# Patient Record
Sex: Male | Born: 1945
Health system: Southern US, Community
[De-identification: ages and names within clinical notes are randomized; demographics above are authoritative.]

## PROBLEM LIST (undated history)

## (undated) DIAGNOSIS — I4891 Unspecified atrial fibrillation: Secondary | ICD-10-CM

## (undated) DIAGNOSIS — I428 Other cardiomyopathies: Secondary | ICD-10-CM

## (undated) DIAGNOSIS — Z95810 Presence of automatic (implantable) cardiac defibrillator: Secondary | ICD-10-CM

## (undated) DIAGNOSIS — M1990 Unspecified osteoarthritis, unspecified site: Secondary | ICD-10-CM

## (undated) DIAGNOSIS — I5042 Chronic combined systolic (congestive) and diastolic (congestive) heart failure: Secondary | ICD-10-CM

## (undated) DIAGNOSIS — E785 Hyperlipidemia, unspecified: Secondary | ICD-10-CM

## (undated) DIAGNOSIS — I10 Essential (primary) hypertension: Secondary | ICD-10-CM

## (undated) HISTORY — DX: Unspecified atrial fibrillation: I48.91

## (undated) HISTORY — DX: Essential (primary) hypertension: I10

## (undated) HISTORY — DX: Chronic combined systolic (congestive) and diastolic (congestive) heart failure: I50.42

## (undated) HISTORY — DX: Hyperlipidemia, unspecified: E78.5

## (undated) HISTORY — PX: TONSILLECTOMY: SUR1361

## (undated) HISTORY — DX: Other cardiomyopathies: I42.8

---

## 2010-07-08 DIAGNOSIS — L219 Seborrheic dermatitis, unspecified: Secondary | ICD-10-CM | POA: Insufficient documentation

## 2012-03-22 DIAGNOSIS — M722 Plantar fascial fibromatosis: Secondary | ICD-10-CM | POA: Insufficient documentation

## 2012-04-28 DIAGNOSIS — I10 Essential (primary) hypertension: Secondary | ICD-10-CM | POA: Insufficient documentation

## 2014-06-14 LAB — HM COLONOSCOPY

## 2014-11-26 ENCOUNTER — Telehealth: Payer: Self-pay | Admitting: *Deleted

## 2014-11-26 ENCOUNTER — Encounter: Payer: Self-pay | Admitting: *Deleted

## 2014-11-26 NOTE — Telephone Encounter (Signed)
Pre-Visit Call completed with patient and chart updated.   Pre-Visit Info documented in Specialty Comments under SnapShot.    

## 2014-11-28 ENCOUNTER — Ambulatory Visit (HOSPITAL_BASED_OUTPATIENT_CLINIC_OR_DEPARTMENT_OTHER)
Admission: RE | Admit: 2014-11-28 | Discharge: 2014-11-28 | Disposition: A | Discharge status: Home / Self Care | Payer: BLUE CROSS/BLUE SHIELD | Source: Ambulatory Visit | Attending: Family Medicine | Admitting: Family Medicine

## 2014-11-28 ENCOUNTER — Encounter: Payer: Self-pay | Admitting: Family Medicine

## 2014-11-28 ENCOUNTER — Ambulatory Visit (INDEPENDENT_AMBULATORY_CARE_PROVIDER_SITE_OTHER): Payer: BLUE CROSS/BLUE SHIELD | Admitting: Family Medicine

## 2014-11-28 VITALS — BP 118/62 | HR 66 | Temp 98.0°F | Ht 71.0 in | Wt 283.0 lb

## 2014-11-28 DIAGNOSIS — I499 Cardiac arrhythmia, unspecified: Secondary | ICD-10-CM | POA: Diagnosis not present

## 2014-11-28 DIAGNOSIS — J189 Pneumonia, unspecified organism: Secondary | ICD-10-CM

## 2014-11-28 DIAGNOSIS — R0602 Shortness of breath: Secondary | ICD-10-CM | POA: Diagnosis not present

## 2014-11-28 DIAGNOSIS — R05 Cough: Secondary | ICD-10-CM | POA: Diagnosis not present

## 2014-11-28 DIAGNOSIS — R059 Cough, unspecified: Secondary | ICD-10-CM

## 2014-11-28 DIAGNOSIS — I10 Essential (primary) hypertension: Secondary | ICD-10-CM

## 2014-11-28 MED ORDER — FUROSEMIDE 20 MG PO TABS: 40.0000 mg | ORAL_TABLET | Freq: Every day | ORAL | Status: DC

## 2014-11-28 MED ORDER — CARVEDILOL 6.25 MG PO TABS
6.2500 mg | ORAL_TABLET | Freq: Two times a day (BID) | ORAL | Status: DC
Start: 2014-11-28 — End: 2014-12-09

## 2014-11-28 MED ORDER — PROMETHAZINE-DM 6.25-15 MG/5ML PO SYRP: 5.0000 mL | ORAL_SOLUTION | Freq: Four times a day (QID) | ORAL | Status: DC | PRN

## 2014-11-28 MED ORDER — AMLODIPINE BESYLATE 5 MG PO TABS: 5.0000 mg | ORAL_TABLET | Freq: Every day | ORAL | Status: DC

## 2014-11-28 MED ORDER — CLARITHROMYCIN ER 500 MG PO TB24: 1000.0000 mg | ORAL_TABLET | Freq: Every day | ORAL | Status: AC

## 2014-11-28 NOTE — Patient Instructions (Signed)

## 2014-11-28 NOTE — Progress Notes (Signed)
Pre visit review using our clinic review tool, if applicable. No additional management support is needed unless otherwise documented below in the visit note. 

## 2014-11-29 ENCOUNTER — Telehealth: Payer: Self-pay | Admitting: Family Medicine

## 2014-11-29 NOTE — Telephone Encounter (Signed)
Caller Name: [name]  Relation to pt: daughter  Call back number:[phone]   Reason for call:  [name] (daughter) calling on behalf of her father. Stating pt was seen by DR. Lown 11/28/14 medication was prescirbed and pt went for a walk today and his stomach started cramping could that be from the medication prescribed?. Pt in need of clinical advice.

## 2014-11-29 NOTE — Telephone Encounter (Signed)
Spoke with patient who stated he was having some bloating that was relieved by going to the restroom.  He was walking and felt uncomfortable from the bloating, which is an ongoing issue he has had before the antibiotic.  He states that he has a procedure scheduled next week (echo) and an appointment with Dr. Etter Sjogren in two weeks.  He states he will call if he has any more abdominal symptoms.

## 2014-11-29 NOTE — Telephone Encounter (Signed)
Please Triage this patient.     KP

## 2014-12-02 NOTE — Progress Notes (Signed)
Patient ID: Alexander Higgins, male    DOB: 1945-11-23  Age: 69 y.o. MRN: [id number]    Subjective:  Subjective HPI Alexander Higgins presents to establish and c/o cough that is productive and edema in low ext.   Review of Systems  Constitutional: Negative for fever and chills.  HENT: Negative for congestion, postnasal drip, rhinorrhea and sinus pressure.   Respiratory: Positive for cough, chest tightness, shortness of breath and wheezing.   Cardiovascular: Negative for chest pain, palpitations and leg swelling.  Allergic/Immunologic: Negative for environmental allergies.  Psychiatric/Behavioral: Negative for decreased concentration. The patient is not nervous/anxious.     History Past Medical History  Diagnosis Date  . Hypertension     He has no past surgical history on file.   His family history includes Heart attack in his brother; Heart disease in his mother; Hypertension in his brother and mother; Stroke in his sister.He reports that he has never smoked. He does not have any smokeless tobacco history on file. He reports that he does not drink alcohol. His drug history is not on file.  No current outpatient prescriptions on file prior to visit.   No current facility-administered medications on file prior to visit.     Objective:  Objective Physical Exam  Constitutional: He is oriented to person, place, and time. He appears well-developed and well-nourished.  HENT:  Right Ear: External ear normal.  Left Ear: External ear normal.  Eyes: Conjunctivae are normal. Right eye exhibits no discharge. Left eye exhibits no discharge.  Cardiovascular: Normal rate, regular rhythm and normal heart sounds.   No murmur heard. Pulmonary/Chest: Effort normal. No respiratory distress. He has no wheezes. He has rales. He exhibits no tenderness.  Musculoskeletal: He exhibits no edema.  Lymphadenopathy:    He has no cervical adenopathy.  Neurological: He is alert and oriented to person, place,  and time.  Psychiatric: He has a normal mood and affect. His behavior is normal. Judgment and thought content normal.   BP 118/62 mmHg  Pulse 66  Temp(Src) 98 F (36.7 C) (Oral)  Ht 5\' 11"  (1.803 m)  Wt 283 lb (128.368 kg)  BMI 39.49 kg/m2  SpO2 96% Wt Readings from Last 3 Encounters:  11/28/14 283 lb (128.368 kg)     No results found for: WBC, HGB, HCT, PLT, GLUCOSE, CHOL, TRIG, HDL, LDLDIRECT, LDLCALC, ALT, AST, NA, K, CL, CREATININE, BUN, CO2, TSH, PSA, INR, GLUF, HGBA1C, MICROALBUR  Dg Chest 2 View  11/28/2014   CLINICAL DATA:  Cough.  Rule out pneumonia.  EXAM: CHEST  2 VIEW  COMPARISON:  CXR 10/13/2014  FINDINGS: Cardiac enlargement without heart failure. Interval resolution of heart failure and edema since prior study.  Lungs are clear without infiltrate or effusion. Negative for mass lesion.  IMPRESSION: No active cardiopulmonary disease.   Electronically Signed   By: Franchot Gallo M.D.   On: 11/28/2014 15:47     Assessment & Plan:  Plan I have changed Mr. Peavler's furosemide. I am also having him start on clarithromycin, promethazine-dextromethorphan, amLODipine, and carvedilol. Additionally, I am having him maintain his amLODipine.  Meds ordered this encounter  Medications  . amLODipine (NORVASC) 10 MG tablet    Sig: Take 10 mg by mouth daily.  . clarithromycin (BIAXIN XL) 500 MG 24 hr tablet    Sig: Take 2 tablets (1,000 mg total) by mouth daily.    Dispense:  28 tablet    Refill:  0  . promethazine-dextromethorphan (PROMETHAZINE-DM) 6.25-15 MG/5ML syrup  Sig: Take 5 mLs by mouth 4 (four) times daily as needed for cough.    Dispense:  118 mL    Refill:  0  . furosemide (LASIX) 20 MG tablet    Sig: Take 2 tablets (40 mg total) by mouth daily.    Dispense:  180 tablet    Refill:  1  . amLODipine (NORVASC) 5 MG tablet    Sig: Take 1 tablet (5 mg total) by mouth daily.    Dispense:  90 tablet    Refill:  3  . carvedilol (COREG) 6.25 MG tablet    Sig: Take 1  tablet (6.25 mg total) by mouth 2 (two) times daily with a meal.    Dispense:  60 tablet    Refill:  2    Problem List Items Addressed This Visit    None    Visit Diagnoses    Irregular heart beat    -  Primary    Relevant Orders    EKG 12-Lead (Completed)    Cough        Relevant Orders    DG Chest 2 View (Completed)    SOB (shortness of breath)        Relevant Orders    ECHOCARDIOGRAM COMPLETE    CAP (community acquired pneumonia)        Relevant Medications    clarithromycin (BIAXIN XL) 500 MG 24 hr tablet    promethazine-dextromethorphan (PROMETHAZINE-DM) 6.25-15 MG/5ML syrup    Essential hypertension        Relevant Medications    amLODipine (NORVASC) 10 MG tablet    furosemide (LASIX) 20 MG tablet    amLODipine (NORVASC) 5 MG tablet    carvedilol (COREG) 6.25 MG tablet       Follow-up: Return in about 2 weeks (around 12/12/2014), or if symptoms worsen or fail to improve, for f/u pneumonia.  Garnet Koyanagi, DO

## 2014-12-04 ENCOUNTER — Telehealth: Payer: Self-pay | Admitting: Family Medicine

## 2014-12-04 ENCOUNTER — Ambulatory Visit (HOSPITAL_BASED_OUTPATIENT_CLINIC_OR_DEPARTMENT_OTHER)
Admission: RE | Admit: 2014-12-04 | Discharge: 2014-12-04 | Disposition: A | Discharge status: Home / Self Care | Payer: BLUE CROSS/BLUE SHIELD | Source: Ambulatory Visit | Attending: Family Medicine | Admitting: Family Medicine

## 2014-12-04 DIAGNOSIS — I517 Cardiomegaly: Secondary | ICD-10-CM | POA: Diagnosis not present

## 2014-12-04 DIAGNOSIS — R0602 Shortness of breath: Secondary | ICD-10-CM

## 2014-12-04 DIAGNOSIS — I340 Nonrheumatic mitral (valve) insufficiency: Secondary | ICD-10-CM | POA: Diagnosis not present

## 2014-12-04 DIAGNOSIS — R0600 Dyspnea, unspecified: Secondary | ICD-10-CM | POA: Diagnosis present

## 2014-12-04 NOTE — Telephone Encounter (Signed)
Caller name: Keagen Relationship to patient: SELF Can be reached: [phone] Pharmacy:  Reason for call: HE WAS HERE FOR HIS ECHO  HE WANTED DR LOWNE TO KNOW WHENEVER HE GOES WALKING HIS STOMACH SWELLS UP TIGHT AND WHEN HE SITS TO REST IT GOES BACK DOWN TO NORMAL

## 2014-12-04 NOTE — Telephone Encounter (Signed)
Please Triage.    KP

## 2014-12-04 NOTE — Telephone Encounter (Signed)
C/o:  Abdominal swelling and tightness.  States he feels fine today.  Last occurrence:  "yesterday and the day before." States it only occurs when he walks long distances.  He exercises on an exercise trail.  Whenever he walked on the trail yesterday and the day before he noticed that his abdomen started to swell and became "just as tight."  Whenever he sat down to rest, abdomen went back down.  He denied having chest pain or abdominal pain at that time, but said he did feel short of breath. Stating that whenever he would breathe, he felt like the air wasn't going all the way back to his lungs.  Pt states he has been having a lot of gas lately.  Last BM: this morning (12/04/14).  No blood noted.  Stool normal in color.   Pt states she was only calling to make Dr. Etter Sjogren aware.    Advice: If symptoms return or are worse, to go to [facility] or ER.  Pt stated understanding and agreed.  Pt was informed that note would be forwarded to Dr. Etter Sjogren for review.   Pt has an upcoming appt on 12/13/14 @ 11:15 am with Dr. Etter Sjogren.

## 2014-12-04 NOTE — Progress Notes (Signed)
Echocardiogram 2D Echocardiogram has been performed.  Alexander Higgins 12/04/2014, 11:53 AM

## 2014-12-04 NOTE — Telephone Encounter (Signed)
noted 

## 2014-12-06 ENCOUNTER — Telehealth: Payer: Self-pay | Admitting: Family Medicine

## 2014-12-06 DIAGNOSIS — I5189 Other ill-defined heart diseases: Secondary | ICD-10-CM

## 2014-12-06 DIAGNOSIS — R0600 Dyspnea, unspecified: Secondary | ICD-10-CM

## 2014-12-06 DIAGNOSIS — I340 Nonrheumatic mitral (valve) insufficiency: Secondary | ICD-10-CM

## 2014-12-06 NOTE — Telephone Encounter (Signed)
Discussed with Kenney Houseman and she verbalized understanding, the ref has been placed.       KP

## 2014-12-06 NOTE — Telephone Encounter (Signed)
Caller name: Kenney Houseman Relationship to patient: daughter Can be reached: [phone] Pharmacy:  Reason for call: Please call her with results of pts echocardiogram.

## 2014-12-06 NOTE — Telephone Encounter (Signed)
Grade I diastolic dysfunction with mild - mod mitral regurg---- secondary to other symptoms---dyspnea , see phone note Refer to cardiology

## 2014-12-09 ENCOUNTER — Ambulatory Visit (INDEPENDENT_AMBULATORY_CARE_PROVIDER_SITE_OTHER): Payer: BLUE CROSS/BLUE SHIELD | Admitting: Cardiovascular Disease

## 2014-12-09 ENCOUNTER — Encounter: Payer: Self-pay | Admitting: Cardiovascular Disease

## 2014-12-09 VITALS — BP 130/88 | HR 98 | Ht 72.0 in | Wt 285.1 lb

## 2014-12-09 DIAGNOSIS — I509 Heart failure, unspecified: Secondary | ICD-10-CM | POA: Diagnosis not present

## 2014-12-09 DIAGNOSIS — I519 Heart disease, unspecified: Secondary | ICD-10-CM

## 2014-12-09 DIAGNOSIS — Z79899 Other long term (current) drug therapy: Secondary | ICD-10-CM

## 2014-12-09 DIAGNOSIS — I10 Essential (primary) hypertension: Secondary | ICD-10-CM | POA: Diagnosis not present

## 2014-12-09 MED ORDER — FUROSEMIDE 40 MG PO TABS: 40.0000 mg | ORAL_TABLET | Freq: Two times a day (BID) | ORAL | Status: DC

## 2014-12-09 MED ORDER — LISINOPRIL 10 MG PO TABS: 10.0000 mg | ORAL_TABLET | Freq: Two times a day (BID) | ORAL | Status: DC

## 2014-12-09 MED ORDER — CARVEDILOL 12.5 MG PO TABS: 12.5000 mg | ORAL_TABLET | Freq: Two times a day (BID) | ORAL | Status: DC

## 2014-12-09 NOTE — Assessment & Plan Note (Signed)
Alexander Higgins was seen in [facility] approximately ago with abdominal bloating, lower extremity edema and shortness of breath. He apparently was sent home on Lasix. Recent 2-D echo performed 12/02/14 revealed an EF of 20-25% with mild to moderate MR, mild LV enlargement without focal wall motion modalities. I suspect he that he has nonischemic myopathy based on poorly treated hypertension. I'm going to get a formal thought might be stress test to rule out an ischemic etiology. He is on amlodipine which I'm going to discontinue. I'm going to increase his carvedilol, add an ACE inhibitor and increase his diabetic. We will check a basic metabolic panel in 2 weeks and have him see Alexander Higgins back in one month for lab check, blood pressure check and titration of his medications. He will need a 2-D echo 3 months back to see me after that. If his LV function remains depressed we will need to discuss the possibility of an ICD for primary prevention

## 2014-12-09 NOTE — Progress Notes (Signed)
12/09/2014 [name]   [birth year]  [id number]  Primary Physician Garnet Koyanagi, DO Primary Cardiologist: Lorretta Harp MD Renae Gloss   HPI:  Mr. Stern is a very pleasant 69 year old moderately overweight divorced African American male father of 65, grandfather to 56 grandchildren who is currently rate tired but worked at [employer] previously. He was referred by Dr. Etter Sjogren for cardiovascular evaluation because of LV dysfunction and symptoms of congestive heart failure. He has a history of hypertension. He never had a heart attack or stroke. He was seen at [facility] month ago with lower extremity edema, shortness of breath and abdominal bloating. He was sent home on furosemide. A recent 2-D echo performed on 12/04/14 revealed an EF of 20-25% with mild LV dilatation, no regional wall motion of the modalities, mild to moderate MR.   Current Outpatient Prescriptions  Medication Sig Dispense Refill  . aspirin EC 81 MG tablet Take 81 mg by mouth daily.    . promethazine-dextromethorphan (PROMETHAZINE-DM) 6.25-15 MG/5ML syrup Take 5 mLs by mouth 4 (four) times daily as needed for cough. 118 mL 0  . [DISCONTINUED] carvedilol (COREG) 6.25 MG tablet Take 1 tablet (6.25 mg total) by mouth 2 (two) times daily with a meal. 60 tablet 2  . [DISCONTINUED] furosemide (LASIX) 20 MG tablet Take 2 tablets (40 mg total) by mouth daily. 180 tablet 1   No current facility-administered medications for this visit.    No Known Allergies  History   Social History  . Marital Status: Single    Spouse Name: N/A  . Number of Children: N/A  . Years of Education: N/A   Occupational History  . Not on file.   Social History Main Topics  . Smoking status: Never Smoker   . Smokeless tobacco: Not on file  . Alcohol Use: No  . Drug Use: Not on file  . Sexual Activity: Not on file   Other Topics Concern  . Not on file   Social History Narrative     Review of  Systems: General: negative for chills, fever, night sweats or weight changes.  Cardiovascular: negative for chest pain, dyspnea on exertion, edema, orthopnea, palpitations, paroxysmal nocturnal dyspnea or shortness of breath Dermatological: negative for rash Respiratory: negative for cough or wheezing Urologic: negative for hematuria Abdominal: negative for nausea, vomiting, diarrhea, bright red blood per rectum, melena, or hematemesis Neurologic: negative for visual changes, syncope, or dizziness All other systems reviewed and are otherwise negative except as noted above.    Blood pressure 130/88, pulse 98, height 6' (1.829 m), weight 285 lb 1.6 oz (129.321 kg).  General appearance: alert and no distress Neck: no adenopathy, no carotid bruit, no JVD, supple, symmetrical, trachea midline and thyroid not enlarged, symmetric, no tenderness/mass/nodules Lungs: clear to auscultation bilaterally Heart: regular rate and rhythm, S1, S2 normal, no murmur, click, rub or gallop Extremities: 1-2+ pitting edema bilaterally  EKG sinus rhythm at 98 with nonspecific ST and T-wave changes. Personal review this EKG  ASSESSMENT AND PLAN:   LV dysfunction Mr. Poehlman was seen in [facility] approximately ago with abdominal bloating, lower extremity edema and shortness of breath. He apparently was sent home on Lasix. Recent 2-D echo performed 12/02/14 revealed an EF of 20-25% with mild to moderate MR, mild LV enlargement without focal wall motion modalities. I suspect he that he has nonischemic myopathy based on poorly treated hypertension. I'm going to get a formal thought might be stress test to rule out  an ischemic etiology. He is on amlodipine which I'm going to discontinue. I'm going to increase his carvedilol, add an ACE inhibitor and increase his diabetic. We will check a basic metabolic panel in 2 weeks and have him see Erasmo Downer back in one month for lab check, blood pressure check and titration of  his medications. He will need a 2-D echo 3 months back to see me after that. If his LV function remains depressed we will need to discuss the possibility of an ICD for primary prevention  Essential hypertension History of hypertension blood pressure measured 130/88. He is on high-dose amlodipine and carvedilol. Given his severe LV dysfunction and decrease his amlodipine, start an ACE inhibitor and increase his Coreg as well as diaphoretic. We will continue to follow his blood pressure as an outpatient and titrate his medications as necessary.      Lorretta Harp MD FACP,FACC,FAHA, [facility] 12/09/2014 4:34 PM

## 2014-12-09 NOTE — Assessment & Plan Note (Signed)
History of hypertension blood pressure measured 130/88. He is on high-dose amlodipine and carvedilol. Given his severe LV dysfunction and decrease his amlodipine, start an ACE inhibitor and increase his Coreg as well as diaphoretic. We will continue to follow his blood pressure as an outpatient and titrate his medications as necessary.

## 2014-12-09 NOTE — Patient Instructions (Signed)
Medication Instructions:  STOP AMLODIPINE START Lisinopril 10 mg - take 1 tablet (10 mg total) by mouth TWICE daily. INCREASE Lasix to 40 mg - take 1 tablet (40 mg total) by mouth TWICE daily. INCREASE Carvedilol to 12.5 mg - take 1 tablet (12.5 mg total) by mouth twice daily. A new prescription has been sent into the pharmacy electronically for all new medications/dosages.  Labwork: Your physician recommends that you return for lab work in 2 weeks. You DO NOT need to be fasting.  Testing/Procedures: Your physician has requested that you have a lexiscan myoview. For further information please visit [email or web address]. Please follow instruction sheet, as given.  Your physician has requested that you have an echocardiogram in 3 months prior to an office visit. Echocardiography is a painless test that uses sound waves to create images of your heart. It provides your doctor with information about the size and shape of your heart and how well your heart's chambers and valves are working. This procedure takes approximately one hour. There are no restrictions for this procedure.  Follow-Up: Dr Gwenlyn Found recommends that you schedule an appointment in 1 month with Tommy Medal, PharmD for a blood pressure check. Please keep a record of your blood pressures to bring with you to this appointment. Dr Gwenlyn Found recommends that you schedule a follow-up appointment in 3 months.

## 2014-12-10 ENCOUNTER — Telehealth: Payer: Self-pay | Admitting: Family Medicine

## 2014-12-10 ENCOUNTER — Telehealth: Payer: Self-pay | Admitting: Cardiovascular Disease

## 2014-12-10 NOTE — Telephone Encounter (Signed)
Caller name: Marlowe Shores Relationship to patient: daughter Can be reached: [phone]  Reason for call: Pt daughter said that he has several appts scheduled between now and the end of September. She was wondering if Dr. Etter Sjogren could write him out of work until the end of September. Current note covers patient thru 12/09/14. Pt has not returned to work yet. He has seen Cardiology and they are waiting for results of some tests that were completed.

## 2014-12-10 NOTE — Telephone Encounter (Signed)
Did cardiology say he should be out that long?

## 2014-12-10 NOTE — Telephone Encounter (Signed)
She would like to know the results of her father's echo and also the medication changes that were made yesterday.Pt saw Dr Gwenlyn Found yesterday.

## 2014-12-10 NOTE — Telephone Encounter (Signed)
Spoke to patient - no DPR on file - he voiced no questions/concerns w/ current meds, latest echo results - told him to call for any needs. He voiced understanding.

## 2014-12-10 NOTE — Telephone Encounter (Signed)
MSG left to call the office      KP 

## 2014-12-10 NOTE — Telephone Encounter (Signed)
Please advise      KP 

## 2014-12-11 NOTE — Telephone Encounter (Signed)
Spoke with [name] and she advised that her dad works in a [employer] and they are stressing him about the time off. He has multiple procedures scheduled and is concerned about his job. She said she is trying to get FMLA drawn up at work but wanted to know if his note can at least be extended for another week until she can get the paperwork complete. 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 She called cardiology but they will not give her any information. Please advise    KP

## 2014-12-12 ENCOUNTER — Telehealth: Payer: Self-pay | Admitting: *Deleted

## 2014-12-12 NOTE — Telephone Encounter (Signed)
FMLA forms received via fax from [facility] [employer]. Forms filled out as much as possible and forwarded to Dr. Etter Sjogren. JG//CMA

## 2014-12-12 NOTE — Telephone Encounter (Signed)
Follow up scheduled for 12/13/14 and the patient will have FMLA paperwork completed.      KP

## 2014-12-12 NOTE — Telephone Encounter (Signed)
noted 

## 2014-12-12 NOTE — Telephone Encounter (Signed)
Extend 2 weeks--- if we can help at all let us know

## 2014-12-13 ENCOUNTER — Ambulatory Visit (INDEPENDENT_AMBULATORY_CARE_PROVIDER_SITE_OTHER): Payer: BLUE CROSS/BLUE SHIELD | Admitting: Family Medicine

## 2014-12-13 ENCOUNTER — Encounter: Payer: Self-pay | Admitting: Family Medicine

## 2014-12-13 VITALS — BP 132/82 | HR 63 | Temp 98.3°F | Ht 72.0 in | Wt 278.5 lb

## 2014-12-13 DIAGNOSIS — I5021 Acute systolic (congestive) heart failure: Secondary | ICD-10-CM | POA: Diagnosis not present

## 2014-12-13 DIAGNOSIS — I10 Essential (primary) hypertension: Secondary | ICD-10-CM | POA: Diagnosis not present

## 2014-12-13 DIAGNOSIS — I519 Heart disease, unspecified: Secondary | ICD-10-CM | POA: Diagnosis not present

## 2014-12-13 DIAGNOSIS — I5042 Chronic combined systolic (congestive) and diastolic (congestive) heart failure: Secondary | ICD-10-CM | POA: Insufficient documentation

## 2014-12-13 LAB — HEPATIC FUNCTION PANEL
ALT: 35 U/L (ref 0–53)
AST: 31 U/L (ref 0–37)
Albumin: 3.8 g/dL (ref 3.5–5.2)
Alkaline Phosphatase: 52 U/L (ref 39–117)
BILIRUBIN DIRECT: 0.1 mg/dL (ref 0.0–0.3)
BILIRUBIN TOTAL: 0.7 mg/dL (ref 0.2–1.2)
Total Protein: 7.6 g/dL (ref 6.0–8.3)

## 2014-12-13 LAB — BASIC METABOLIC PANEL
BUN: 24 mg/dL — ABNORMAL HIGH (ref 6–23)
CO2: 27 mEq/L (ref 19–32)
Calcium: 9.4 mg/dL (ref 8.4–10.5)
Chloride: 104 mEq/L (ref 96–112)
Creatinine, Ser: 1.36 mg/dL (ref 0.40–1.50)
GFR: 67.05 mL/min (ref >60.00)
Glucose, Bld: 96 mg/dL (ref 70–99)
Potassium: 3.9 mEq/L (ref 3.5–5.1)
Sodium: 138 mEq/L (ref 135–145)

## 2014-12-13 LAB — CBC WITH DIFFERENTIAL/PLATELET
BASOS ABS: 0 10*3/uL (ref 0.0–0.1)
Basophils Relative: 0.3 % (ref 0.0–3.0)
EOS PCT: 1.3 % (ref 0.0–5.0)
Eosinophils Absolute: 0.1 10*3/uL (ref 0.0–0.7)
HCT: 49.3 % (ref 39.0–52.0)
HEMOGLOBIN: 16.6 g/dL (ref 13.0–17.0)
LYMPHS PCT: 31.8 % (ref 12.0–46.0)
Lymphs Abs: 2.4 10*3/uL (ref 0.7–4.0)
MCHC: 33.8 g/dL (ref 30.0–36.0)
MCV: 90.2 fl (ref 78.0–100.0)
MONOS PCT: 10.6 % (ref 3.0–12.0)
Monocytes Absolute: 0.8 10*3/uL (ref 0.1–1.0)
NEUTROS ABS: 4.1 10*3/uL (ref 1.4–7.7)
NEUTROS PCT: 56 % (ref 43.0–77.0)
Platelets: 161 10*3/uL (ref 150.0–400.0)
RBC: 5.46 Mil/uL (ref 4.22–5.81)
RDW: 14.2 % (ref 11.5–15.5)
WBC: 7.4 10*3/uL (ref 4.0–10.5)

## 2014-12-13 LAB — POCT URINALYSIS DIPSTICK
Bilirubin, UA: NEGATIVE
Blood, UA: NEGATIVE
GLUCOSE UA: NEGATIVE
Ketones, UA: NEGATIVE
LEUKOCYTES UA: NEGATIVE
Nitrite, UA: NEGATIVE
Protein, UA: NEGATIVE
Spec Grav, UA: 1.02
Urobilinogen, UA: 4
pH, UA: 5.5

## 2014-12-13 LAB — MICROALBUMIN / CREATININE URINE RATIO
Creatinine,U: 42.6 mg/dL
MICROALB/CREAT RATIO: 1.6 mg/g (ref 0.0–30.0)
Microalb, Ur: <0.7 mg/dL (ref 0.0–1.9)

## 2014-12-13 LAB — LIPID PANEL
Cholesterol: 186 mg/dL (ref 0–200)
HDL: 42.9 mg/dL (ref >39.00)
LDL Cholesterol: 120 mg/dL — ABNORMAL HIGH (ref 0–99)
NONHDL: 143.1
Total CHOL/HDL Ratio: 4
Triglycerides: 117 mg/dL (ref 0.0–149.0)
VLDL: 23.4 mg/dL (ref 0.0–40.0)

## 2014-12-13 NOTE — Patient Instructions (Signed)

## 2014-12-13 NOTE — Assessment & Plan Note (Signed)
Per cardiology 

## 2014-12-13 NOTE — Progress Notes (Signed)
Patient ID: Alexander Higgins, male    DOB: March 17, 1946  Age: 69 y.o. MRN: [id number]    Subjective:  Subjective HPI Alexander Higgins presents for f/u bp and cardiology visit.  He has fmla paperwork to fill out.  Review of Systems  Constitutional: Negative for diaphoresis, appetite change, fatigue and unexpected weight change.  Eyes: Negative for pain, redness and visual disturbance.  Respiratory: Negative for cough, chest tightness, shortness of breath and wheezing.   Cardiovascular: Negative for chest pain, palpitations and leg swelling.  Endocrine: Negative for cold intolerance, heat intolerance, polydipsia, polyphagia and polyuria.  Genitourinary: Negative for dysuria, frequency and difficulty urinating.  Neurological: Negative for dizziness, light-headedness, numbness and headaches.  Psychiatric/Behavioral: Negative for dysphoric mood. The patient is not nervous/anxious.     History Past Medical History  Diagnosis Date  . Hypertension   . LV dysfunction     systolic heart failure    He has no past surgical history on file.   His family history includes Heart attack in his brother; Heart disease in his mother; Hypertension in his brother and mother; Stroke in his sister.He reports that he has never smoked. He does not have any smokeless tobacco history on file. He reports that he does not drink alcohol. His drug history is not on file.  Current Outpatient Prescriptions on File Prior to Visit  Medication Sig Dispense Refill  . aspirin EC 81 MG tablet Take 81 mg by mouth daily.    . carvedilol (COREG) 12.5 MG tablet Take 1 tablet (12.5 mg total) by mouth 2 (two) times daily. 60 tablet 5  . furosemide (LASIX) 40 MG tablet Take 1 tablet (40 mg total) by mouth 2 (two) times daily. 60 tablet 5  . lisinopril (PRINIVIL,ZESTRIL) 10 MG tablet Take 1 tablet (10 mg total) by mouth 2 (two) times daily. 60 tablet 5  . promethazine-dextromethorphan (PROMETHAZINE-DM) 6.25-15 MG/5ML syrup Take 5  mLs by mouth 4 (four) times daily as needed for cough. 118 mL 0   No current facility-administered medications on file prior to visit.     Objective:  Objective Physical Exam  Constitutional: He is oriented to person, place, and time. Vital signs are normal. He appears well-developed and well-nourished. He is sleeping.  HENT:  Head: Normocephalic and atraumatic.  Mouth/Throat: Oropharynx is clear and moist.  Eyes: EOM are normal. Pupils are equal, round, and reactive to light.  Neck: Normal range of motion. Neck supple. No thyromegaly present.  Cardiovascular: Normal rate and regular rhythm.   No murmur heard. Pulmonary/Chest: Effort normal and breath sounds normal. No respiratory distress. He has no wheezes. He has no rales. He exhibits no tenderness.  Musculoskeletal: He exhibits no edema or tenderness.  Neurological: He is alert and oriented to person, place, and time.  Skin: Skin is warm and dry.  Psychiatric: He has a normal mood and affect. His behavior is normal. Judgment and thought content normal.   BP 132/82 mmHg  Pulse 63  Temp(Src) 98.3 F (36.8 C) (Oral)  Ht 6' (1.829 m)  Wt 278 lb 8 oz (126.327 kg)  BMI 37.76 kg/m2  SpO2 98% Wt Readings from Last 3 Encounters:  12/13/14 278 lb 8 oz (126.327 kg)  12/09/14 285 lb 1.6 oz (129.321 kg)  11/28/14 283 lb (128.368 kg)     No results found for: WBC, HGB, HCT, PLT, GLUCOSE, CHOL, TRIG, HDL, LDLDIRECT, LDLCALC, ALT, AST, NA, K, CL, CREATININE, BUN, CO2, TSH, PSA, INR, GLUF, HGBA1C, MICROALBUR  No results found.  Assessment & Plan:  Plan I am having Alexander Higgins maintain his promethazine-dextromethorphan, aspirin EC, furosemide, carvedilol, and lisinopril.  No orders of the defined types were placed in this encounter.    Problem List Items Addressed This Visit    Systolic CHF - Primary   Relevant Orders   Basic metabolic panel   CBC with Differential/Platelet   Hepatic function panel   Lipid panel    Microalbumin / creatinine urine ratio   POCT urinalysis dipstick   LV dysfunction    Per cardiology      Relevant Orders   Basic metabolic panel   CBC with Differential/Platelet   Hepatic function panel   Lipid panel   Microalbumin / creatinine urine ratio   POCT urinalysis dipstick   Essential hypertension    meds adjusted by cardiology con't coreg, ace and lasix F/u cpe      Relevant Orders   Basic metabolic panel   CBC with Differential/Platelet   Hepatic function panel   Lipid panel   Microalbumin / creatinine urine ratio   POCT urinalysis dipstick      Follow-up: Return in about 6 months (around 06/15/2015) for fasting, annual exam.  Garnet Koyanagi, DO

## 2014-12-13 NOTE — Progress Notes (Signed)
Pre visit review using our clinic review tool, if applicable. No additional management support is needed unless otherwise documented below in the visit note. 

## 2014-12-13 NOTE — Assessment & Plan Note (Signed)
meds adjusted by cardiology con't coreg, ace and lasix F/u cpe

## 2014-12-17 ENCOUNTER — Other Ambulatory Visit: Payer: Self-pay

## 2014-12-17 DIAGNOSIS — E785 Hyperlipidemia, unspecified: Secondary | ICD-10-CM

## 2014-12-19 ENCOUNTER — Telehealth (HOSPITAL_COMMUNITY): Payer: Self-pay

## 2014-12-19 NOTE — Telephone Encounter (Signed)
Encounter complete. 

## 2014-12-20 ENCOUNTER — Telehealth: Payer: Self-pay | Admitting: *Deleted

## 2014-12-20 NOTE — Telephone Encounter (Signed)
Forms were completed during OV last week. Faxed successfully by Ronaldo Miyamoto, CMA.

## 2014-12-20 NOTE — Telephone Encounter (Signed)
Received disability claim form: physician's statement from pt. Filled out as much as possible and forwarded to Dr. Etter Sjogren. JG//CMA

## 2014-12-24 ENCOUNTER — Ambulatory Visit (HOSPITAL_BASED_OUTPATIENT_CLINIC_OR_DEPARTMENT_OTHER)
Admission: RE | Admit: 2014-12-24 | Discharge: 2014-12-24 | Disposition: A | Discharge status: Home / Self Care | Payer: Medicare Other | Source: Ambulatory Visit | Attending: Cardiology | Admitting: Cardiology

## 2014-12-24 ENCOUNTER — Ambulatory Visit (HOSPITAL_COMMUNITY)
Admission: RE | Admit: 2014-12-24 | Discharge: 2014-12-24 | Disposition: A | Discharge status: Home / Self Care | Payer: Medicare Other | Source: Ambulatory Visit | Attending: Cardiovascular Disease | Admitting: Cardiovascular Disease

## 2014-12-24 DIAGNOSIS — I519 Heart disease, unspecified: Secondary | ICD-10-CM

## 2014-12-24 DIAGNOSIS — I509 Heart failure, unspecified: Secondary | ICD-10-CM | POA: Diagnosis not present

## 2014-12-24 DIAGNOSIS — I340 Nonrheumatic mitral (valve) insufficiency: Secondary | ICD-10-CM | POA: Insufficient documentation

## 2014-12-24 DIAGNOSIS — I517 Cardiomegaly: Secondary | ICD-10-CM | POA: Insufficient documentation

## 2014-12-24 MED ORDER — TECHNETIUM TC 99M SESTAMIBI GENERIC - CARDIOLITE
31.6000 | Freq: Once | INTRAVENOUS | Status: AC | PRN
  Administered 2014-12-24: 32 via INTRAVENOUS

## 2014-12-24 MED ORDER — REGADENOSON 0.4 MG/5ML IV SOLN
0.4000 mg | Freq: Once | INTRAVENOUS | Status: AC
  Administered 2014-12-24: 0.4 mg via INTRAVENOUS

## 2014-12-24 MED ORDER — AMINOPHYLLINE 25 MG/ML IV SOLN
75.0000 mg | Freq: Once | INTRAVENOUS | Status: AC
  Administered 2014-12-24: 75 mg via INTRAVENOUS

## 2014-12-25 ENCOUNTER — Ambulatory Visit (HOSPITAL_COMMUNITY)
Admission: RE | Admit: 2014-12-25 | Discharge: 2014-12-25 | Disposition: A | Discharge status: Home / Self Care | Payer: Medicare Other | Source: Ambulatory Visit | Attending: Cardiology | Admitting: Cardiology

## 2014-12-25 LAB — MYOCARDIAL PERFUSION IMAGING
CHL CUP NUCLEAR SDS: 2
CHL CUP NUCLEAR SSS: 3
Peak HR: 76 {beats}/min
Rest HR: 61 {beats}/min
SRS: 1
TID: 1.02

## 2014-12-25 MED ORDER — TECHNETIUM TC 99M SESTAMIBI GENERIC - CARDIOLITE
31.0000 | Freq: Once | INTRAVENOUS | Status: AC | PRN
  Administered 2014-12-25: 31 via INTRAVENOUS

## 2014-12-26 ENCOUNTER — Telehealth: Payer: Self-pay | Admitting: Cardiovascular Disease

## 2014-12-26 NOTE — Telephone Encounter (Signed)
She would like patient's test results from this week.

## 2014-12-26 NOTE — Telephone Encounter (Signed)
Curt Bears, RN notified daughter of results.

## 2015-01-01 ENCOUNTER — Ambulatory Visit (INDEPENDENT_AMBULATORY_CARE_PROVIDER_SITE_OTHER): Payer: BLUE CROSS/BLUE SHIELD | Admitting: Cardiovascular Disease

## 2015-01-01 ENCOUNTER — Encounter: Payer: Self-pay | Admitting: Cardiovascular Disease

## 2015-01-01 VITALS — BP 136/98 | HR 69 | Ht 72.0 in | Wt 278.0 lb

## 2015-01-01 DIAGNOSIS — E785 Hyperlipidemia, unspecified: Secondary | ICD-10-CM | POA: Diagnosis not present

## 2015-01-01 DIAGNOSIS — I5022 Chronic systolic (congestive) heart failure: Secondary | ICD-10-CM

## 2015-01-01 DIAGNOSIS — I519 Heart disease, unspecified: Secondary | ICD-10-CM | POA: Diagnosis not present

## 2015-01-01 DIAGNOSIS — Z79899 Other long term (current) drug therapy: Secondary | ICD-10-CM | POA: Diagnosis not present

## 2015-01-01 DIAGNOSIS — I10 Essential (primary) hypertension: Secondary | ICD-10-CM

## 2015-01-01 MED ORDER — CARVEDILOL 25 MG PO TABS: 25.0000 mg | ORAL_TABLET | Freq: Two times a day (BID) | ORAL | Status: DC

## 2015-01-01 NOTE — Assessment & Plan Note (Signed)
Alexander Higgins has documented ejection fraction in the 20% range. He has mild to moderate mitral regurgitation. A Myoview stress test suggested nonischemic cardiopathy. His medications were adjusted at his last office visit and his symptoms have markedly improved. I'm going to continue to titrate his carvedilol and we'll check a 2-D echocardiogram in 3 months to demonstrate improved LV function. Hopefully he'll not require ICD implantation for primary prevention.

## 2015-01-01 NOTE — Assessment & Plan Note (Signed)
History of hypertension with blood pressure measured today at 136/98 although he did not take his medications this morning. His blood pressure at home runs in the 130/80 range. He is on lisinopril, furosemide and carvedilol. I didn't titrate his carvedilol today. His restriction. Of suggested that he obtain a digital blood pressure cuff and keep a blood pressure log. Continue current medications.

## 2015-01-01 NOTE — Patient Instructions (Signed)
Medication Instructions:  Dr Gwenlyn Found has recommended making the following medication changes: INCREASE Carvedilol to 25 mg twice daily. A new prescription has been sent to your pharmacy. You may take 2 tablets twice daily until it runs out.   Labwork: Your physician recommends that you return for lab work in  3 months.  Testing/Procedures: Your physician has requested that you have an echocardiogram in 3 months. Echocardiography is a painless test that uses sound waves to create images of your heart. It provides your doctor with information about the size and shape of your heart and how well your heart's chambers and valves are working. This procedure takes approximately one hour. There are no restrictions for this procedure.  Follow-Up: Dr Gwenlyn Found recommends that you schedule a follow-up appointment in 3 months.

## 2015-01-01 NOTE — Assessment & Plan Note (Signed)
History of mild hypovolemia with an LDL of 120. Talked about dietary modifications and will recheck a lipid and liver profile 3 months prior to consideration of beginning a statin drug.

## 2015-01-01 NOTE — Progress Notes (Signed)
01/01/2015 Alexander Higgins   1945/06/27  [id number]  Primary Physician Garnet Koyanagi, DO Primary Cardiologist: Lorretta Harp MD Renae Gloss   HPI:   Alexander Higgins is a very pleasant 69 year old moderately overweight divorced African American male father of 8, grandfather to 58 grandchildren who is currently retired but worked at [employer] previously. He was referred by Dr. Etter Sjogren for cardiovascular evaluation because of LV dysfunction and symptoms of congestive heart failure. He has a history of hypertension. He never had a heart attack or stroke. He was seen at [facility] month ago with lower extremity edema, shortness of breath and abdominal bloating. He was sent home on furosemide. A recent 2-D echo performed on 12/04/14 revealed an EF of 20-25% with mild LV dilatation, no regional wall motion of the modalities, mild to moderate MR. I adjusted his medications and added carvedilol. A Myoview stress test showed diaphragmatic attenuation without ischemia or scar. His symptoms have markedly improved.   Current Outpatient Prescriptions  Medication Sig Dispense Refill  . aspirin EC 81 MG tablet Take 81 mg by mouth daily.    . carvedilol (COREG) 25 MG tablet Take 1 tablet (25 mg total) by mouth 2 (two) times daily with a meal. 60 tablet 11  . furosemide (LASIX) 40 MG tablet Take 1 tablet (40 mg total) by mouth 2 (two) times daily. 60 tablet 5  . lisinopril (PRINIVIL,ZESTRIL) 10 MG tablet Take 1 tablet (10 mg total) by mouth 2 (two) times daily. 60 tablet 5   No current facility-administered medications for this visit.    No Known Allergies  History   Social History  . Marital Status: Single    Spouse Name: N/A  . Number of Children: N/A  . Years of Education: N/A   Occupational History  . Not on file.   Social History Main Topics  . Smoking status: Never Smoker   . Smokeless tobacco: Not on file  . Alcohol Use: No  . Drug Use: Not on file  . Sexual  Activity: Not on file   Other Topics Concern  . Not on file   Social History Narrative     Review of Systems: General: negative for chills, fever, night sweats or weight changes.  Cardiovascular: negative for chest pain, dyspnea on exertion, edema, orthopnea, palpitations, paroxysmal nocturnal dyspnea or shortness of breath Dermatological: negative for rash Respiratory: negative for cough or wheezing Urologic: negative for hematuria Abdominal: negative for nausea, vomiting, diarrhea, bright red blood per rectum, melena, or hematemesis Neurologic: negative for visual changes, syncope, or dizziness All other systems reviewed and are otherwise negative except as noted above.    Blood pressure 136/98, pulse 69, height 6' (1.829 m), weight 278 lb (126.1 kg).  General appearance: alert and no distress Neck: no adenopathy, no carotid bruit, no JVD, supple, symmetrical, trachea midline and thyroid not enlarged, symmetric, no tenderness/mass/nodules Lungs: clear to auscultation bilaterally Heart: regular rate and rhythm, S1, S2 normal, no murmur, click, rub or gallop Extremities: extremities normal, atraumatic, no cyanosis or edema  EKG not performed today  ASSESSMENT AND PLAN:   Systolic CHF Mr. Weldy has documented ejection fraction in the 20% range. He has mild to moderate mitral regurgitation. A Myoview stress test suggested nonischemic cardiopathy. His medications were adjusted at his last office visit and his symptoms have markedly improved. I'm going to continue to titrate his carvedilol and we'll check a 2-D echocardiogram in 3 months to demonstrate improved LV function. Hopefully he'll  not require ICD implantation for primary prevention.  Essential hypertension History of hypertension with blood pressure measured today at 136/98 although he did not take his medications this morning. His blood pressure at home runs in the 130/80 range. He is on lisinopril, furosemide and  carvedilol. I didn't titrate his carvedilol today. His restriction. Of suggested that he obtain a digital blood pressure cuff and keep a blood pressure log. Continue current medications.  Hyperlipidemia History of mild hypovolemia with an LDL of 120. Talked about dietary modifications and will recheck a lipid and liver profile 3 months prior to consideration of beginning a statin drug.      Lorretta Harp MD FACP,FACC,FAHA, [facility] 01/01/2015 10:08 AM

## 2015-01-09 ENCOUNTER — Ambulatory Visit (INDEPENDENT_AMBULATORY_CARE_PROVIDER_SITE_OTHER): Payer: BLUE CROSS/BLUE SHIELD | Admitting: Pharmacist Clinician (PhC)/ Clinical Pharmacy Specialist

## 2015-01-09 ENCOUNTER — Encounter: Payer: Self-pay | Admitting: Pharmacist Clinician (PhC)/ Clinical Pharmacy Specialist

## 2015-01-09 VITALS — BP 112/72 | HR 56 | Ht 72.0 in | Wt 282.3 lb

## 2015-01-09 DIAGNOSIS — I10 Essential (primary) hypertension: Secondary | ICD-10-CM

## 2015-01-09 NOTE — Patient Instructions (Signed)
Your blood pressure today is 112/72  (goal is <140/90)  Check your blood pressure at home daily and keep record of the readings.  Take your BP meds as follows: continue with lisinopril 10 mg, furosemide 40 mg and carvedilol 25 all twice daily (take second furosemide dose around noon-2pm)  Bring all of your meds, your BP cuff and your record of home blood pressures to your next appointment.  Exercise as you're able, try to walk approximately 30 minutes per day.  Keep salt intake to a minimum, especially watch canned and prepared boxed foods.  Eat more fresh fruits and vegetables and fewer canned items.  Avoid eating in fast food restaurants.    HOW TO TAKE YOUR BLOOD PRESSURE: . Rest 5 minutes before taking your blood pressure. .  Don't smoke or drink caffeinated beverages for at least 30 minutes before. . Take your blood pressure before (not after) you eat. . Sit comfortably with your back supported and both feet on the floor (don't cross your legs). . Elevate your arm to heart level on a table or a desk. . Use the proper sized cuff. It should fit smoothly and snugly around your bare upper arm. There should be enough room to slip a fingertip under the cuff. The bottom edge of the cuff should be 1 inch above the crease of the elbow. . Ideally, take 3 measurements at one sitting and record the average.

## 2015-01-09 NOTE — Progress Notes (Signed)
     01/09/2015 [name] 1946-04-11 [id number]   HPI:  Alexander Higgins is a 69 y.o. male patient of Dr. Gwenlyn Found, with a PMH below who presents today for hypertension clinic evaluation.  Mr. Graveline states that for over 10 years he would have yearly physicals for his job and was always noted to be "slightly hypertensive" although nothing was ever done to treat it.    Cardiac Hx: systolic CHF, EF 56% (pt believe this is in part due to years of untreated hypertension); hyperlipidemia  Family Hx: sister also has heart disease  Social Hx: does not smoke or use other tobacco products.  No alcohol, rare caffeine  Diet: has been working to improve recently.  Is living with daughter, eating more vegetables, chicken and fish.  No added salt, uses Ms Deliah Boston.  No fried foods.  Exercise: currently doing 9 laps (just under 1/2 mile each) daily, about 4-4.5 miles; uses gym at apartment complex 2-3 times per week.  Home BP readings:  Just bought new BP machine, [employer] (as it had appropriate sized cuff).  Home readings average 130/80 with high of 143 and low of 979 systolic  Current antihypertensive medications:  Carvedilol 25 mg bid, furosemide 40 mg bid and lisinopril 10 mg bid   Current Outpatient Prescriptions  Medication Sig Dispense Refill  . aspirin EC 81 MG tablet Take 81 mg by mouth daily.    . carvedilol (COREG) 25 MG tablet Take 1 tablet (25 mg total) by mouth 2 (two) times daily with a meal. 60 tablet 11  . furosemide (LASIX) 40 MG tablet Take 1 tablet (40 mg total) by mouth 2 (two) times daily. 60 tablet 5  . lisinopril (PRINIVIL,ZESTRIL) 10 MG tablet Take 1 tablet (10 mg total) by mouth 2 (two) times daily. 60 tablet 5   No current facility-administered medications for this visit.    No Known Allergies  Past Medical History  Diagnosis Date  . Hypertension   . LV dysfunction     systolic heart failure  . Nonischemic cardiomyopathy   . Hyperlipidemia     Blood pressure  112/72, pulse 56, height 6' (1.829 m), weight 282 lb 4.8 oz (128.05 kg).    Tommy Medal PharmD CPP [facility]

## 2015-01-09 NOTE — Assessment & Plan Note (Addendum)
Blood pressure today looks good at 112/72.  His home readings have been slightly higher and we will have him bring in his cuff for calibration at his next visit.  He has been working hard at increasing his exercise tolerance and adjusting his diet over the past couple of months.  I am not going to make any changes to his medications today, as he feels no dizziness or weakness from the lower BP.   He was encouraged to continue with daily home BP monitoring, I did ask that he include his heart rate on the BP log, as he has been recently titrated to 25 mg bid of carvedilol.  He is due to see Dr. Gwenlyn Found in 2 months, so I will have him bring in his cuff and readings at that time.  He knows to call in the meantime should he have any concerns

## 2015-01-17 ENCOUNTER — Encounter: Payer: Self-pay | Admitting: Cardiovascular Disease

## 2015-02-06 ENCOUNTER — Telehealth: Payer: Self-pay | Admitting: Cardiovascular Disease

## 2015-02-06 MED ORDER — FUROSEMIDE 40 MG PO TABS: 40.0000 mg | ORAL_TABLET | Freq: Two times a day (BID) | ORAL | Status: DC

## 2015-02-06 MED ORDER — CARVEDILOL 25 MG PO TABS: 25.0000 mg | ORAL_TABLET | Freq: Two times a day (BID) | ORAL | Status: DC

## 2015-02-06 MED ORDER — LISINOPRIL 10 MG PO TABS: 10.0000 mg | ORAL_TABLET | Freq: Two times a day (BID) | ORAL | Status: DC

## 2015-02-06 NOTE — Telephone Encounter (Signed)
Rx(s) sent to pharmacy electronically.  

## 2015-02-06 NOTE — Telephone Encounter (Signed)
°  1. Which medications need to be refilled? Furosemide,Carvedilol,and Lisinopril- pt appt is not until October  2. Which pharmacy is medication to be sent to? [facility][phone]  3. Do they need a 30 day or 90 day supply? #120 for all of them,until his appt. 4. Would they like a call back once the medication has been sent to the pharmacy? no

## 2015-03-05 ENCOUNTER — Telehealth: Payer: Self-pay | Admitting: Cardiovascular Disease

## 2015-03-05 MED ORDER — VALSARTAN 160 MG PO TABS: 160.0000 mg | ORAL_TABLET | Freq: Every day | ORAL | Status: DC

## 2015-03-05 NOTE — Telephone Encounter (Signed)
Please call,coughing before and after he saw Dr Gwenlyn Found.He is not sure what it is coming from.

## 2015-03-05 NOTE — Telephone Encounter (Signed)
Stop lisinopril, start valsartan 160 mg qd.  It may take up to 3-4 weeks for cough to resolve.  Keep October appt with Dr. Gwenlyn Found

## 2015-03-05 NOTE — Telephone Encounter (Signed)
Returned call to patient.  Regarding lisinopril, he notes he has been on this for a few months now, and has had a consistent daily dry cough since soon after starting medication.  Initially attributed cough to residual respiratory symptoms eval'd by Dr. Etter Sjogren. He notes cough is non-productive w/ exception of occasional clear sputum. He is not having fever, pain, or chest congestion w/ this.  He reports, by the way, that BPs are continuing to be at target and feels dosing of medication is appropriate. However, he does understand potential SE's of ACE inhibitors. Informed patient I would submit to Dr. Gwenlyn Found and Erasmo Downer to advise on options. Pt agreeable to this.

## 2015-03-05 NOTE — Telephone Encounter (Signed)
Pt notified of new med, instructions; medication prescription sent to his preferred pharmacy. Pt vebalized understanding of instructions, knows to call if questions and to keep appt w/ Dr. Gwenlyn Found for October.

## 2015-03-06 ENCOUNTER — Telehealth: Payer: Self-pay | Admitting: *Deleted

## 2015-03-06 NOTE — Telephone Encounter (Signed)
Pt dropped off disability forms, spoke with Maudie Mercury and she stated that pt needs an OV with Dr. Etter Sjogren. Called and made appt with patient for 03/09/14 at 1 pm. Forms given to [facility]. JG//CMA

## 2015-03-10 ENCOUNTER — Ambulatory Visit (INDEPENDENT_AMBULATORY_CARE_PROVIDER_SITE_OTHER): Payer: Medicare Other | Admitting: Family Medicine

## 2015-03-10 ENCOUNTER — Encounter: Payer: Self-pay | Admitting: Family Medicine

## 2015-03-10 VITALS — BP 140/77 | HR 58 | Temp 97.9°F | Ht 72.0 in | Wt 285.6 lb

## 2015-03-10 DIAGNOSIS — I5020 Unspecified systolic (congestive) heart failure: Secondary | ICD-10-CM

## 2015-03-10 DIAGNOSIS — I10 Essential (primary) hypertension: Secondary | ICD-10-CM

## 2015-03-10 DIAGNOSIS — Z23 Encounter for immunization: Secondary | ICD-10-CM

## 2015-03-10 NOTE — Assessment & Plan Note (Signed)
Running 130/ 70 at home con't diovan , lasix and coreg rto 6 months Pt seeing cardiology very regularly

## 2015-03-10 NOTE — Assessment & Plan Note (Addendum)
con't meds--  Lasix, coreg, diovan  F/u cards  Pt disability papers filled out

## 2015-03-10 NOTE — Progress Notes (Signed)
Patient ID: Alexander Higgins, male   DOB: Mar 07, 1946, 69 y.o.   MRN: [id number]   Subjective:    Patient ID: Alexander Higgins, male    DOB: 11-13-1945, 69 y.o.   MRN: [id number]  Chief Complaint  Patient presents with  . Follow-up    Disability froms    HPI Patient is in today for to have his disability papers filled out.  Pt is feeling good.  No sob, no cp, no palpitations.    Past Medical History  Diagnosis Date  . Hypertension   . LV dysfunction     systolic heart failure  . Nonischemic cardiomyopathy   . Hyperlipidemia     No past surgical history on file.  Family History  Problem Relation Age of Onset  . Hypertension Mother   . Hypertension Brother   . Heart disease Mother   . Heart attack Brother   . Stroke Sister     Social History   Social History  . Marital Status: Single    Spouse Name: N/A  . Number of Children: N/A  . Years of Education: N/A   Occupational History  . Not on file.   Social History Main Topics  . Smoking status: Never Smoker   . Smokeless tobacco: Not on file  . Alcohol Use: No  . Drug Use: Not on file  . Sexual Activity: Not on file   Other Topics Concern  . Not on file   Social History Narrative    Outpatient Prescriptions Prior to Visit  Medication Sig Dispense Refill  . aspirin EC 81 MG tablet Take 81 mg by mouth daily.    . carvedilol (COREG) 25 MG tablet Take 1 tablet (25 mg total) by mouth 2 (two) times daily with a meal. 120 tablet 1  . furosemide (LASIX) 40 MG tablet Take 1 tablet (40 mg total) by mouth 2 (two) times daily. 120 tablet 1  . valsartan (DIOVAN) 160 MG tablet Take 1 tablet (160 mg total) by mouth daily. 30 tablet 2   No facility-administered medications prior to visit.    No Known Allergies  Review of Systems  Constitutional: Negative for fever and malaise/fatigue.  HENT: Negative for congestion.   Eyes: Negative for discharge.  Respiratory: Negative for shortness of breath.   Cardiovascular:  Negative for chest pain, palpitations and leg swelling.  Gastrointestinal: Negative for nausea and abdominal pain.  Genitourinary: Negative for dysuria.  Musculoskeletal: Negative for falls.  Skin: Negative for rash.  Neurological: Negative for loss of consciousness and headaches.  Endo/Heme/Allergies: Negative for environmental allergies.  Psychiatric/Behavioral: Negative for depression. The patient is not nervous/anxious.        Objective:    Physical Exam  Constitutional: He is oriented to person, place, and time. Vital signs are normal. He appears well-developed and well-nourished. He is sleeping.  HENT:  Head: Normocephalic and atraumatic.  Mouth/Throat: Oropharynx is clear and moist.  Eyes: EOM are normal. Pupils are equal, round, and reactive to light.  Neck: Normal range of motion. Neck supple. No thyromegaly present.  Cardiovascular: Normal rate and regular rhythm.   No murmur heard. Pulmonary/Chest: Effort normal and breath sounds normal. No respiratory distress. He has no wheezes. He has no rales. He exhibits no tenderness.  Musculoskeletal: He exhibits no edema or tenderness.  Neurological: He is alert and oriented to person, place, and time.  Skin: Skin is warm and dry.  Psychiatric: He has a normal mood and affect. His behavior is normal. Judgment and thought  content normal.  Nursing note and vitals reviewed.   BP 140/77 mmHg  Pulse 58  Temp(Src) 97.9 F (36.6 C) (Oral)  Ht 6' (1.829 m)  Wt 285 lb 9.6 oz (129.547 kg)  BMI 38.73 kg/m2  SpO2 97% Wt Readings from Last 3 Encounters:  03/10/15 285 lb 9.6 oz (129.547 kg)  01/09/15 282 lb 4.8 oz (128.05 kg)  01/01/15 278 lb (126.1 kg)     Lab Results  Component Value Date   WBC 7.4 12/13/2014   HGB 16.6 12/13/2014   HCT 49.3 12/13/2014   PLT 161.0 12/13/2014   GLUCOSE 96 12/13/2014   CHOL 186 12/13/2014   TRIG 117.0 12/13/2014   HDL 42.90 12/13/2014   LDLCALC 120* 12/13/2014   ALT 35 12/13/2014   AST 31  12/13/2014   NA 138 12/13/2014   K 3.9 12/13/2014   CL 104 12/13/2014   CREATININE 1.36 12/13/2014   BUN 24* 12/13/2014   CO2 27 12/13/2014   MICROALBUR <0.7 12/13/2014    No results found for: TSH Lab Results  Component Value Date   WBC 7.4 12/13/2014   HGB 16.6 12/13/2014   HCT 49.3 12/13/2014   MCV 90.2 12/13/2014   PLT 161.0 12/13/2014   Lab Results  Component Value Date   NA 138 12/13/2014   K 3.9 12/13/2014   CO2 27 12/13/2014   GLUCOSE 96 12/13/2014   BUN 24* 12/13/2014   CREATININE 1.36 12/13/2014   BILITOT 0.7 12/13/2014   ALKPHOS 52 12/13/2014   AST 31 12/13/2014   ALT 35 12/13/2014   PROT 7.6 12/13/2014   ALBUMIN 3.8 12/13/2014   CALCIUM 9.4 12/13/2014   GFR 67.05 12/13/2014   Lab Results  Component Value Date   CHOL 186 12/13/2014   Lab Results  Component Value Date   HDL 42.90 12/13/2014   Lab Results  Component Value Date   LDLCALC 120* 12/13/2014   Lab Results  Component Value Date   TRIG 117.0 12/13/2014   Lab Results  Component Value Date   CHOLHDL 4 12/13/2014   No results found for: HGBA1C     Assessment & Plan:   Problem List Items Addressed This Visit    Essential hypertension - Primary    Running 130/ 70 at home con't diovan , lasix and coreg rto 6 months Pt seeing cardiology very regularly         I am having Alexander Higgins maintain his aspirin EC, furosemide, carvedilol, and valsartan.  No orders of the defined types were placed in this encounter.     Alexander Koyanagi, DO

## 2015-03-10 NOTE — Progress Notes (Signed)
Pre visit review using our clinic review tool, if applicable. No additional management support is needed unless otherwise documented below in the visit note. 

## 2015-03-10 NOTE — Patient Instructions (Addendum)

## 2015-03-11 ENCOUNTER — Ambulatory Visit: Payer: BLUE CROSS/BLUE SHIELD | Admitting: Cardiovascular Disease

## 2015-03-14 ENCOUNTER — Telehealth: Payer: Self-pay | Admitting: Cardiovascular Disease

## 2015-03-14 MED ORDER — FUROSEMIDE 40 MG PO TABS: 40.0000 mg | ORAL_TABLET | Freq: Two times a day (BID) | ORAL | Status: DC

## 2015-03-14 MED ORDER — CARVEDILOL 25 MG PO TABS
25.0000 mg | ORAL_TABLET | Freq: Two times a day (BID) | ORAL | Status: DC
Start: 2015-03-14 — End: 2015-04-08

## 2015-03-14 NOTE — Telephone Encounter (Signed)
°  1. Which medications need to be refilled? Carvedilol and Furosemide  2. Which pharmacy is medication to be sent to?[phone]  3. Do they need a 30 day or 90 day supply? 30  4. Would they like a call back once the medication has been sent to the pharmacy? yes

## 2015-03-14 NOTE — Telephone Encounter (Signed)
Rx(s) sent to pharmacy electronically. Patient notified of refill  

## 2015-04-03 ENCOUNTER — Ambulatory Visit (HOSPITAL_COMMUNITY): Payer: Medicare Other

## 2015-04-04 ENCOUNTER — Ambulatory Visit: Payer: BLUE CROSS/BLUE SHIELD | Admitting: Cardiovascular Disease

## 2015-04-04 ENCOUNTER — Ambulatory Visit (HOSPITAL_COMMUNITY): Payer: Medicare Other | Attending: Cardiovascular Disease

## 2015-04-04 ENCOUNTER — Other Ambulatory Visit: Payer: Self-pay | Admitting: Cardiovascular Disease

## 2015-04-04 ENCOUNTER — Other Ambulatory Visit: Payer: Self-pay

## 2015-04-04 DIAGNOSIS — I10 Essential (primary) hypertension: Secondary | ICD-10-CM

## 2015-04-04 DIAGNOSIS — I519 Heart disease, unspecified: Secondary | ICD-10-CM | POA: Insufficient documentation

## 2015-04-04 DIAGNOSIS — E785 Hyperlipidemia, unspecified: Secondary | ICD-10-CM | POA: Diagnosis not present

## 2015-04-04 DIAGNOSIS — I517 Cardiomegaly: Secondary | ICD-10-CM | POA: Diagnosis not present

## 2015-04-08 ENCOUNTER — Ambulatory Visit (INDEPENDENT_AMBULATORY_CARE_PROVIDER_SITE_OTHER): Payer: Medicare Other | Admitting: Cardiovascular Disease

## 2015-04-08 ENCOUNTER — Encounter: Payer: Self-pay | Admitting: Cardiovascular Disease

## 2015-04-08 VITALS — BP 150/74 | HR 70 | Ht 72.0 in | Wt 283.5 lb

## 2015-04-08 DIAGNOSIS — I519 Heart disease, unspecified: Secondary | ICD-10-CM | POA: Diagnosis not present

## 2015-04-08 MED ORDER — FUROSEMIDE 40 MG PO TABS: 40.0000 mg | ORAL_TABLET | Freq: Two times a day (BID) | ORAL | Status: DC

## 2015-04-08 MED ORDER — CARVEDILOL 25 MG PO TABS: 25.0000 mg | ORAL_TABLET | Freq: Two times a day (BID) | ORAL | Status: DC

## 2015-04-08 MED ORDER — VALSARTAN 160 MG PO TABS: 160.0000 mg | ORAL_TABLET | Freq: Every day | ORAL | Status: DC

## 2015-04-08 NOTE — Patient Instructions (Signed)
Medication Instructions:  Your physician recommends that you continue on your current medications as directed. Please refer to the Current Medication list given to you today.   Labwork: none  Testing/Procedures: none  Follow-Up: Your physician recommends that you schedule a follow-up appointment in: 2-3 weeks with Dr. Sallyanne Kuster - for possible ICD placement  Your physician wants you to follow-up in: 6 months with Dr. Gwenlyn Found. You will receive a reminder letter in the mail two months in advance. If you don't receive a letter, please call our office to schedule the follow-up appointment.   Any Other Special Instructions Will Be Listed Below (If Applicable).     If you need a refill on your cardiac medications before your next appointment, please call your pharmacy.

## 2015-04-08 NOTE — Assessment & Plan Note (Signed)
History of nonischemic cardiomyopathy with medication optimization. He had a nonischemic Myoview. His ejection fraction increased from the 20-25% range up to the 30 - 35% range by recent 2-D echo. He does feel clinically improved. I'm going to refer him to Dr. Sallyanne Kuster for evaluation for ICD implantation for primary prevention and will see him back in 6 months for follow-up.

## 2015-04-08 NOTE — Progress Notes (Signed)
History of nonischemic cardiomyopathy with medication optimization. He had a nonischemic Myoview. His ejection fraction increased from the 20-25% range up to the 30 - 35% range by recent 2-D echo. He does feel clinically improved. I'm going to refer him to Dr. Sallyanne Kuster for evaluation for ICD implantation for primary prevention and will see him back in 6 months for follow-up

## 2015-04-11 ENCOUNTER — Other Ambulatory Visit: Payer: Self-pay | Admitting: Cardiovascular Disease

## 2015-04-11 DIAGNOSIS — I519 Heart disease, unspecified: Secondary | ICD-10-CM

## 2015-04-14 ENCOUNTER — Other Ambulatory Visit (HOSPITAL_COMMUNITY): Payer: Medicare Other

## 2015-04-14 ENCOUNTER — Other Ambulatory Visit: Payer: Self-pay

## 2015-04-14 MED ORDER — LISINOPRIL 10 MG PO TABS: 10.0000 mg | ORAL_TABLET | Freq: Two times a day (BID) | ORAL | Status: DC

## 2015-04-18 ENCOUNTER — Encounter: Payer: Self-pay | Admitting: Cardiovascular Disease

## 2015-04-18 NOTE — Progress Notes (Signed)
Patient ID: Alexander Higgins, male   DOB: 1946/03/11, 69 y.o.   MRN: [id number]     Cardiology Office Note   Date:  04/21/2015   ID:  Alexander Higgins, DOB Sep 30, 1945, MRN [id number]  PCP:  Alexander Koyanagi, DO  Cardiologist:  Alexander Burow, MD;  Alexander Klein, MD   Chief Complaint  Patient presents with  . DISCUSS ICD PLACEMENT  . Shortness of Breath      History of Present Illness: Alexander Higgins is a 69 y.o. male who presents for discussion of primary prevention ICD implantation. He was initially diagnosed with CHF in June 2016, when his echo showed LVEF 20-25%. After > 3 months of comprehensive medical therapy (max dose ACEi + ARB and carvedilol), LVEF has improved, but remains moderate to severely depresed at 30-35% by repeat echo on 10/21. The left ventricle is dilated. He has not had angina and his nuclear perfusion study was normal in July 2016. His ECG shows narrow QRS complex, no Q waves, no ST-T changes.   generally feels well, functional class I-II. Only complaint is occasional fatigue. Likes to workout at the gym. Snores, but does not have other overt features to suggest obstructive sleep apnea.    Past Medical History  Diagnosis Date  . Hypertension   . LV dysfunction     systolic heart failure  . Nonischemic cardiomyopathy ([place])   . Hyperlipidemia     History reviewed. No pertinent past surgical history.   Current Outpatient Prescriptions  Medication Sig Dispense Refill  . aspirin EC 81 MG tablet Take 81 mg by mouth daily.    . carvedilol (COREG) 25 MG tablet Take 1 tablet (25 mg total) by mouth 2 (two) times daily with a meal. 180 tablet 3  . furosemide (LASIX) 40 MG tablet Take 1 tablet (40 mg total) by mouth 2 (two) times daily. 180 tablet 3  . lisinopril (PRINIVIL,ZESTRIL) 10 MG tablet Take 1 tablet (10 mg total) by mouth 2 (two) times daily. 180 tablet 3  . valsartan (DIOVAN) 160 MG tablet Take 1 tablet (160 mg total) by mouth daily. 30 tablet 2   No current  facility-administered medications for this visit.    Allergies:   Review of patient's allergies indicates no known allergies.    Social History:  The patient  reports that he has never smoked. He does not have any smokeless tobacco history on file. He reports that he does not drink alcohol.   Family History:  The patient's family history includes Heart attack in his brother; Heart disease in his mother; Hypertension in his brother and mother; Stroke in his sister.    ROS:  Please see the history of present illness.    Otherwise, review of systems positive for none.   All other systems are reviewed and negative.    PHYSICAL EXAM: VS:  BP 122/80 mmHg  Pulse 54  Ht 6' (1.829 m)  Wt 286 lb (129.729 kg)  BMI 38.78 kg/m2 , BMI Body mass index is 38.78 kg/(m^2).  General: Alert, oriented x3, no distress Head: no evidence of trauma, PERRL, EOMI, no exophtalmos or lid lag, no myxedema, no xanthelasma; normal ears, nose and oropharynx Neck: normal jugular venous pulsations and no hepatojugular reflux; brisk carotid pulses without delay and no carotid bruits Chest: clear to auscultation, no signs of consolidation by percussion or palpation, normal fremitus, symmetrical and full respiratory excursions Cardiovascular: normal position and quality of the apical impulse, regular rhythm, normal first and second heart sounds, no murmurs,  rubs or gallops Abdomen: no tenderness or distention, no masses by palpation, no abnormal pulsatility or arterial bruits, normal bowel sounds, no hepatosplenomegaly Extremities: no clubbing, cyanosis or edema; 2+ radial, ulnar and brachial pulses bilaterally; 2+ right femoral, posterior tibial and dorsalis pedis pulses; 2+ left femoral, posterior tibial and dorsalis pedis pulses; no subclavian or femoral bruits Neurological: grossly nonfocal Psych: euthymic mood, full affect   EKG:  EKG is ordered today. The ekg ordered today demonstrates  Sinus bradycardia,  nonspecific ST-T changes in leads 2, 3, aVF, V6, normal QRS duration and normal QT interval   Recent Labs: 12/13/2014: ALT 35; BUN 24*; Creatinine, Ser 1.36; Hemoglobin 16.6; Platelets 161.0; Potassium 3.9; Sodium 138    Lipid Panel    Component Value Date/Time   CHOL 186 12/13/2014 1155   TRIG 117.0 12/13/2014 1155   HDL 42.90 12/13/2014 1155   CHOLHDL 4 12/13/2014 1155   VLDL 23.4 12/13/2014 1155   LDLCALC 120* 12/13/2014 1155      Wt Readings from Last 3 Encounters:  04/21/15 286 lb (129.729 kg)  04/08/15 283 lb 8 oz (128.595 kg)  03/10/15 285 lb 9.6 oz (129.547 kg)      Other studies Reviewed: Additional studies/ records that were reviewed today include:  Records from Dr. Quay Higgins.   ASSESSMENT AND PLAN:  Mr. Caylor meets criteria for primary prevention ICD implantation for non ischemic cardiomyopathy (left ventricular ejection fraction under 35%, heart failure NYHA class II-III, on comprehensive medical therapy >90 days).  He has sinus bradycardia and is in his late 68s.  Complains of fatigue , possibly beta blocker-related or due to heart failure. I think he will benefit from a dual-chamber device.  This procedure has been fully reviewed with the patient and informed consent has been obtained.  We reviewed in particular the risks of device implantation complications (lead dislodgment, infection, perforation, pneumothorax, need for reoperation, etc. ) and long-term complications including hardware/software failures or recalls , unnecessary therapy , remote monitoring details and generator change out issues.   Current medicines are reviewed at length with the patient today.  The patient does not have concerns regarding medicines.  The following changes have been made:  no change  Labs/ tests ordered today include:   Orders Placed This Encounter  Procedures  . CBC  . Comprehensive metabolic panel  . APTT  . Protime-INR  . EKG 12-Lead  . IMPLANTABLE  CARDIOVERTER DEFIBRILLATOR IMPLANT     Patient Instructions  Your physician has recommended that you have a [employer] defibrillator inserted. An implantable cardioverter defibrillator (ICD) is a small device that is placed in your chest or, in rare cases, your abdomen. This device uses electrical pulses or shocks to help control life-threatening, irregular heartbeats that could lead the heart to suddenly stop beating (sudden cardiac arrest). Leads are attached to the ICD that goes into your heart. This is done in the hospital and usually requires an overnight stay. Please see the instruction sheet given to you today for more information.  Your physician recommends that you return for lab work in: [place] ICD IMPLANT          Signed, Alexander Klein, MD  04/21/2015 9:12 AM    Alexander Klein, MD, [facility] [phone] office [phone] pager

## 2015-04-21 ENCOUNTER — Encounter: Payer: Self-pay | Admitting: Cardiovascular Disease

## 2015-04-21 ENCOUNTER — Telehealth: Payer: Self-pay | Admitting: Cardiovascular Disease

## 2015-04-21 ENCOUNTER — Other Ambulatory Visit: Payer: Self-pay | Admitting: *Deleted

## 2015-04-21 ENCOUNTER — Ambulatory Visit (INDEPENDENT_AMBULATORY_CARE_PROVIDER_SITE_OTHER): Payer: Medicare Other | Admitting: Cardiovascular Disease

## 2015-04-21 VITALS — BP 122/80 | HR 54 | Ht 72.0 in | Wt 286.0 lb

## 2015-04-21 DIAGNOSIS — I10 Essential (primary) hypertension: Secondary | ICD-10-CM | POA: Diagnosis not present

## 2015-04-21 DIAGNOSIS — I519 Heart disease, unspecified: Secondary | ICD-10-CM

## 2015-04-21 DIAGNOSIS — Z01812 Encounter for preprocedural laboratory examination: Secondary | ICD-10-CM

## 2015-04-21 DIAGNOSIS — Z01818 Encounter for other preprocedural examination: Secondary | ICD-10-CM | POA: Diagnosis not present

## 2015-04-21 DIAGNOSIS — I429 Cardiomyopathy, unspecified: Secondary | ICD-10-CM | POA: Diagnosis not present

## 2015-04-21 DIAGNOSIS — I428 Other cardiomyopathies: Secondary | ICD-10-CM

## 2015-04-21 DIAGNOSIS — I5022 Chronic systolic (congestive) heart failure: Secondary | ICD-10-CM | POA: Diagnosis not present

## 2015-04-21 DIAGNOSIS — Z79899 Other long term (current) drug therapy: Secondary | ICD-10-CM | POA: Diagnosis not present

## 2015-04-21 DIAGNOSIS — D689 Coagulation defect, unspecified: Secondary | ICD-10-CM | POA: Diagnosis not present

## 2015-04-21 LAB — COMPREHENSIVE METABOLIC PANEL
ALT: 26 U/L (ref 9–46)
AST: 30 U/L (ref 10–35)
Albumin: 3.8 g/dL (ref 3.6–5.1)
Alkaline Phosphatase: 52 U/L (ref 40–115)
BUN: 15 mg/dL (ref 7–25)
CALCIUM: 8.7 mg/dL (ref 8.6–10.3)
CO2: 27 mmol/L (ref 20–31)
Chloride: 104 mmol/L (ref 98–110)
Creat: 1.04 mg/dL (ref 0.70–1.25)
GLUCOSE: 92 mg/dL (ref 65–99)
POTASSIUM: 4 mmol/L (ref 3.5–5.3)
Sodium: 141 mmol/L (ref 135–146)
Total Bilirubin: 0.5 mg/dL (ref 0.2–1.2)
Total Protein: 7.2 g/dL (ref 6.1–8.1)

## 2015-04-21 LAB — CBC
HEMATOCRIT: 43.5 % (ref 39.0–52.0)
Hemoglobin: 15.5 g/dL (ref 13.0–17.0)
MCH: 31.6 pg (ref 26.0–34.0)
MCHC: 35.6 g/dL (ref 30.0–36.0)
MCV: 88.8 fL (ref 78.0–100.0)
MPV: 11 fL (ref 8.6–12.4)
PLATELETS: 186 10*3/uL (ref 150–400)
RBC: 4.9 MIL/uL (ref 4.22–5.81)
RDW: 14 % (ref 11.5–15.5)
WBC: 5.9 10*3/uL (ref 4.0–10.5)

## 2015-04-21 NOTE — Telephone Encounter (Signed)
Returned call to patient's daughter. She is on patient's DPR. She was not able to come to patient's appointment today. Her father told her he was having a heart cath. Explained to daughter the procedure her dad is having, why he is having it and his pre-procedure instructions. She voiced understanding.

## 2015-04-21 NOTE — Patient Instructions (Signed)
Your physician has recommended that you have a [place] [employer] defibrillator inserted. An implantable cardioverter defibrillator (ICD) is a small device that is placed in your chest or, in rare cases, your abdomen. This device uses electrical pulses or shocks to help control life-threatening, irregular heartbeats that could lead the heart to suddenly stop beating (sudden cardiac arrest). Leads are attached to the ICD that goes into your heart. This is done in the hospital and usually requires an overnight stay. Please see the instruction sheet given to you today for more information.  Your physician recommends that you return for lab work in: [place] ICD IMPLANT

## 2015-04-21 NOTE — Telephone Encounter (Signed)
Pt's daughter called in wanting to speak with a nurse about the pt's surgery that is coming up. Please f/u with her at work. She will be there until 7:30pm  Thanks

## 2015-04-22 LAB — PROTIME-INR
INR: 1.08 (ref <1.50)
Prothrombin Time: 14.1 seconds (ref 11.6–15.2)

## 2015-04-22 LAB — APTT: aPTT: 32 seconds (ref 24–37)

## 2015-04-23 ENCOUNTER — Encounter (HOSPITAL_COMMUNITY)
Admission: RE | Disposition: A | Discharge status: Home / Self Care | Payer: Medicare Other | Source: Ambulatory Visit | Attending: Cardiovascular Disease

## 2015-04-23 ENCOUNTER — Ambulatory Visit (HOSPITAL_COMMUNITY)
Admission: RE | Admit: 2015-04-23 | Discharge: 2015-04-24 | Disposition: A | Discharge status: Home / Self Care | Payer: Medicare Other | Source: Ambulatory Visit | Attending: Cardiovascular Disease | Admitting: Cardiovascular Disease

## 2015-04-23 ENCOUNTER — Encounter (HOSPITAL_COMMUNITY): Payer: Self-pay | Admitting: General Practice

## 2015-04-23 DIAGNOSIS — I10 Essential (primary) hypertension: Secondary | ICD-10-CM | POA: Insufficient documentation

## 2015-04-23 DIAGNOSIS — Z8249 Family history of ischemic heart disease and other diseases of the circulatory system: Secondary | ICD-10-CM | POA: Insufficient documentation

## 2015-04-23 DIAGNOSIS — I429 Cardiomyopathy, unspecified: Secondary | ICD-10-CM | POA: Diagnosis not present

## 2015-04-23 DIAGNOSIS — R001 Bradycardia, unspecified: Secondary | ICD-10-CM

## 2015-04-23 DIAGNOSIS — Z79899 Other long term (current) drug therapy: Secondary | ICD-10-CM | POA: Diagnosis not present

## 2015-04-23 DIAGNOSIS — I428 Other cardiomyopathies: Secondary | ICD-10-CM | POA: Insufficient documentation

## 2015-04-23 DIAGNOSIS — I5022 Chronic systolic (congestive) heart failure: Secondary | ICD-10-CM | POA: Diagnosis not present

## 2015-04-23 DIAGNOSIS — Z7982 Long term (current) use of aspirin: Secondary | ICD-10-CM | POA: Diagnosis not present

## 2015-04-23 DIAGNOSIS — I5042 Chronic combined systolic (congestive) and diastolic (congestive) heart failure: Secondary | ICD-10-CM | POA: Diagnosis present

## 2015-04-23 DIAGNOSIS — Z950 Presence of cardiac pacemaker: Secondary | ICD-10-CM

## 2015-04-23 DIAGNOSIS — E785 Hyperlipidemia, unspecified: Secondary | ICD-10-CM | POA: Diagnosis not present

## 2015-04-23 DIAGNOSIS — Z9189 Other specified personal risk factors, not elsewhere classified: Secondary | ICD-10-CM

## 2015-04-23 DIAGNOSIS — Z95810 Presence of automatic (implantable) cardiac defibrillator: Secondary | ICD-10-CM | POA: Diagnosis present

## 2015-04-23 HISTORY — DX: Presence of automatic (implantable) cardiac defibrillator: Z95.810

## 2015-04-23 HISTORY — PX: EP IMPLANTABLE DEVICE: SHX172B

## 2015-04-23 HISTORY — PX: INSERTION OF ICD: SHX6689

## 2015-04-23 HISTORY — DX: Unspecified osteoarthritis, unspecified site: M19.90

## 2015-04-23 LAB — SURGICAL PCR SCREEN
MRSA, PCR: NEGATIVE
Staphylococcus aureus: NEGATIVE

## 2015-04-23 SURGERY — ICD IMPLANT

## 2015-04-23 MED ORDER — FENTANYL CITRATE (PF) 100 MCG/2ML IJ SOLN
INTRAMUSCULAR | Status: DC | PRN
  Administered 2015-04-23 (×2): 25 ug via INTRAVENOUS
  Administered 2015-04-23: 50 ug via INTRAVENOUS

## 2015-04-23 MED ORDER — MUPIROCIN 2 % EX OINT
TOPICAL_OINTMENT | CUTANEOUS | Status: AC
  Administered 2015-04-23: 1 via TOPICAL
  Filled 2015-04-23: qty 22

## 2015-04-23 MED ORDER — IRBESARTAN 75 MG PO TABS
150.0000 mg | ORAL_TABLET | Freq: Every day | ORAL | Status: DC
  Administered 2015-04-24: 09:00:00 150 mg via ORAL
  Filled 2015-04-23: qty 2

## 2015-04-23 MED ORDER — MIDAZOLAM HCL 5 MG/5ML IJ SOLN
INTRAMUSCULAR | Status: DC | PRN
  Administered 2015-04-23 (×2): 2 mg via INTRAVENOUS
  Administered 2015-04-23: 1 mg via INTRAVENOUS
  Administered 2015-04-23 (×2): 2 mg via INTRAVENOUS

## 2015-04-23 MED ORDER — FUROSEMIDE 40 MG PO TABS
40.0000 mg | ORAL_TABLET | Freq: Two times a day (BID) | ORAL | Status: DC
  Administered 2015-04-23 – 2015-04-24 (×2): 40 mg via ORAL
  Filled 2015-04-23 (×2): qty 1

## 2015-04-23 MED ORDER — SODIUM CHLORIDE 0.9 % IR SOLN: 80.0000 mg | Status: DC

## 2015-04-23 MED ORDER — MIDAZOLAM HCL 5 MG/5ML IJ SOLN
INTRAMUSCULAR | Status: AC
  Filled 2015-04-23: qty 25

## 2015-04-23 MED ORDER — ASPIRIN EC 81 MG PO TBEC
81.0000 mg | DELAYED_RELEASE_TABLET | Freq: Every day | ORAL | Status: DC
  Administered 2015-04-24: 81 mg via ORAL
  Filled 2015-04-23: qty 1

## 2015-04-23 MED ORDER — GENTAMICIN SULFATE 40 MG/ML IJ SOLN
INTRAMUSCULAR | Status: AC
  Filled 2015-04-23: qty 2

## 2015-04-23 MED ORDER — LIDOCAINE HCL (PF) 1 % IJ SOLN
INTRAMUSCULAR | Status: AC
  Filled 2015-04-23: qty 60

## 2015-04-23 MED ORDER — CEFAZOLIN SODIUM 1-5 GM-% IV SOLN
INTRAVENOUS | Status: AC
  Filled 2015-04-23: qty 50

## 2015-04-23 MED ORDER — MUPIROCIN 2 % EX OINT
1.0000 "application " | TOPICAL_OINTMENT | Freq: Once | CUTANEOUS | Status: AC
  Administered 2015-04-23: 1 via TOPICAL
  Filled 2015-04-23: qty 22

## 2015-04-23 MED ORDER — FENTANYL CITRATE (PF) 100 MCG/2ML IJ SOLN
INTRAMUSCULAR | Status: AC
  Filled 2015-04-23: qty 4

## 2015-04-23 MED ORDER — SODIUM CHLORIDE 0.9 % IV SOLN
INTRAVENOUS | Status: DC
  Administered 2015-04-23: 16:00:00 via INTRAVENOUS

## 2015-04-23 MED ORDER — CARVEDILOL 12.5 MG PO TABS
25.0000 mg | ORAL_TABLET | Freq: Two times a day (BID) | ORAL | Status: DC
  Administered 2015-04-23 – 2015-04-24 (×2): 25 mg via ORAL
  Filled 2015-04-23 (×2): qty 2

## 2015-04-23 MED ORDER — ONDANSETRON HCL 4 MG/2ML IJ SOLN: 4.0000 mg | Freq: Four times a day (QID) | INTRAMUSCULAR | Status: DC | PRN

## 2015-04-23 MED ORDER — CEFAZOLIN SODIUM 1-5 GM-% IV SOLN
INTRAVENOUS | Status: DC | PRN
Start: 2015-04-23 — End: 2015-04-23
  Administered 2015-04-23: 1 g via INTRAVENOUS

## 2015-04-23 MED ORDER — CEFAZOLIN SODIUM-DEXTROSE 2-3 GM-% IV SOLR
INTRAVENOUS | Status: AC
  Filled 2015-04-23: qty 50

## 2015-04-23 MED ORDER — YOU HAVE A PACEMAKER BOOK
Freq: Once | Status: AC
  Administered 2015-04-23: 17:00:00
  Filled 2015-04-23: qty 1

## 2015-04-23 MED ORDER — HYDROCODONE-ACETAMINOPHEN 5-325 MG PO TABS
1.0000 | ORAL_TABLET | ORAL | Status: DC | PRN
  Administered 2015-04-23: 2 via ORAL
  Filled 2015-04-23: qty 2

## 2015-04-23 MED ORDER — CEFAZOLIN SODIUM 1-5 GM-% IV SOLN
1.0000 g | Freq: Four times a day (QID) | INTRAVENOUS | Status: AC
  Administered 2015-04-23 – 2015-04-24 (×3): 1 g via INTRAVENOUS
  Filled 2015-04-23 (×3): qty 50

## 2015-04-23 MED ORDER — ACETAMINOPHEN 325 MG PO TABS
325.0000 mg | ORAL_TABLET | ORAL | Status: DC | PRN
  Administered 2015-04-23: 20:00:00 650 mg via ORAL
  Filled 2015-04-23: qty 2

## 2015-04-23 MED ORDER — SODIUM CHLORIDE 0.9 % IV SOLN
INTRAVENOUS | Status: DC
  Administered 2015-04-23: 1000 mL via INTRAVENOUS

## 2015-04-23 MED ORDER — CHLORHEXIDINE GLUCONATE 4 % EX LIQD: 60.0000 mL | Freq: Once | CUTANEOUS | Status: DC

## 2015-04-23 MED ORDER — CEFAZOLIN SODIUM-DEXTROSE 2-3 GM-% IV SOLR
INTRAVENOUS | Status: DC | PRN
  Administered 2015-04-23: 2 g via INTRAVENOUS

## 2015-04-23 MED ORDER — SODIUM CHLORIDE 0.9 % IJ SOLN: 3.0000 mL | INTRAMUSCULAR | Status: DC | PRN

## 2015-04-23 SURGICAL SUPPLY — 9 items
CABLE SURGICAL S-101-97-12 (CABLE) ×3 IMPLANT
ICD DUAL INOGEN EL DR DF4 D142 (ICD Generator) ×3 IMPLANT
INGEVITY MRI 7741-52CM (Lead) ×3 IMPLANT
LEAD PACING INGEVITY MRI 52CM (Lead) ×1 IMPLANT
LEAD RELIANCE G DF4 0293 (Lead) ×3 IMPLANT
PAD DEFIB LIFELINK (PAD) ×3 IMPLANT
SHEATH CLASSIC 7F (SHEATH) ×3 IMPLANT
SHEATH CLASSIC 9.5F (SHEATH) ×3 IMPLANT
TRAY PACEMAKER INSERTION (PACKS) ×3 IMPLANT

## 2015-04-23 NOTE — H&P (View-Only) (Signed)
Patient ID: Alexander Higgins, male   DOB: 1946-05-14, 69 y.o.   MRN: [id number]     Cardiology Office Note   Date:  04/21/2015   ID:  Alexander Higgins, DOB April 08, 1946, MRN [id number]  PCP:  Alexander Koyanagi, DO  Cardiologist:  Alexander Burow, MD;  Alexander Klein, MD   Chief Complaint  Patient presents with  . DISCUSS ICD PLACEMENT  . Shortness of Breath      History of Present Illness: Alexander Higgins is a 69 y.o. male who presents for discussion of primary prevention ICD implantation. He was initially diagnosed with CHF in June 2016, when his echo showed LVEF 20-25%. After > 3 months of comprehensive medical therapy (max dose ACEi + ARB and carvedilol), LVEF has improved, but remains moderate to severely depresed at 30-35% by repeat echo on 10/21. The left ventricle is dilated. He has not had angina and his nuclear perfusion study was normal in July 2016. His ECG shows narrow QRS complex, no Q waves, no ST-T changes.   generally feels well, functional class I-II. Only complaint is occasional fatigue. Likes to workout at the gym. Snores, but does not have other overt features to suggest obstructive sleep apnea.    Past Medical History  Diagnosis Date  . Hypertension   . LV dysfunction     systolic heart failure  . Nonischemic cardiomyopathy ([place])   . Hyperlipidemia     History reviewed. No pertinent past surgical history.   Current Outpatient Prescriptions  Medication Sig Dispense Refill  . aspirin EC 81 MG tablet Take 81 mg by mouth daily.    . carvedilol (COREG) 25 MG tablet Take 1 tablet (25 mg total) by mouth 2 (two) times daily with a meal. 180 tablet 3  . furosemide (LASIX) 40 MG tablet Take 1 tablet (40 mg total) by mouth 2 (two) times daily. 180 tablet 3  . lisinopril (PRINIVIL,ZESTRIL) 10 MG tablet Take 1 tablet (10 mg total) by mouth 2 (two) times daily. 180 tablet 3  . valsartan (DIOVAN) 160 MG tablet Take 1 tablet (160 mg total) by mouth daily. 30 tablet 2   No current  facility-administered medications for this visit.    Allergies:   Review of patient's allergies indicates no known allergies.    Social History:  The patient  reports that he has never smoked. He does not have any smokeless tobacco history on file. He reports that he does not drink alcohol.   Family History:  The patient's family history includes Heart attack in his brother; Heart disease in his mother; Hypertension in his brother and mother; Stroke in his sister.    ROS:  Please see the history of present illness.    Otherwise, review of systems positive for none.   All other systems are reviewed and negative.    PHYSICAL EXAM: VS:  BP 122/80 mmHg  Pulse 54  Ht 6' (1.829 m)  Wt 286 lb (129.729 kg)  BMI 38.78 kg/m2 , BMI Body mass index is 38.78 kg/(m^2).  General: Alert, oriented x3, no distress Head: no evidence of trauma, PERRL, EOMI, no exophtalmos or lid lag, no myxedema, no xanthelasma; normal ears, nose and oropharynx Neck: normal jugular venous pulsations and no hepatojugular reflux; brisk carotid pulses without delay and no carotid bruits Chest: clear to auscultation, no signs of consolidation by percussion or palpation, normal fremitus, symmetrical and full respiratory excursions Cardiovascular: normal position and quality of the apical impulse, regular rhythm, normal first and second heart sounds, no murmurs,  rubs or gallops Abdomen: no tenderness or distention, no masses by palpation, no abnormal pulsatility or arterial bruits, normal bowel sounds, no hepatosplenomegaly Extremities: no clubbing, cyanosis or edema; 2+ radial, ulnar and brachial pulses bilaterally; 2+ right femoral, posterior tibial and dorsalis pedis pulses; 2+ left femoral, posterior tibial and dorsalis pedis pulses; no subclavian or femoral bruits Neurological: grossly nonfocal Psych: euthymic mood, full affect   EKG:  EKG is ordered today. The ekg ordered today demonstrates  Sinus bradycardia,  nonspecific ST-T changes in leads 2, 3, aVF, V6, normal QRS duration and normal QT interval   Recent Labs: 12/13/2014: ALT 35; BUN 24*; Creatinine, Ser 1.36; Hemoglobin 16.6; Platelets 161.0; Potassium 3.9; Sodium 138    Lipid Panel    Component Value Date/Time   CHOL 186 12/13/2014 1155   TRIG 117.0 12/13/2014 1155   HDL 42.90 12/13/2014 1155   CHOLHDL 4 12/13/2014 1155   VLDL 23.4 12/13/2014 1155   LDLCALC 120* 12/13/2014 1155      Wt Readings from Last 3 Encounters:  04/21/15 286 lb (129.729 kg)  04/08/15 283 lb 8 oz (128.595 kg)  03/10/15 285 lb 9.6 oz (129.547 kg)      Other studies Reviewed: Additional studies/ records that were reviewed today include:  Records from Dr. Quay Higgins.   ASSESSMENT AND PLAN:  Mr. [name] meets criteria for primary prevention ICD implantation for non ischemic cardiomyopathy (left ventricular ejection fraction under 35%, heart failure NYHA class II-III, on comprehensive medical therapy >90 days).  He has sinus bradycardia and is in his late 71s.  Complains of fatigue , possibly beta blocker-related or due to heart failure. I think he will benefit from a dual-chamber device.  This procedure has been fully reviewed with the patient and informed consent has been obtained.  We reviewed in particular the risks of device implantation complications (lead dislodgment, infection, perforation, pneumothorax, need for reoperation, etc. ) and long-term complications including hardware/software failures or recalls , unnecessary therapy , remote monitoring details and generator change out issues.   Current medicines are reviewed at length with the patient today.  The patient does not have concerns regarding medicines.  The following changes have been made:  no change  Labs/ tests ordered today include:   Orders Placed This Encounter  Procedures  . CBC  . Comprehensive metabolic panel  . APTT  . Protime-INR  . EKG 12-Lead  . IMPLANTABLE  CARDIOVERTER DEFIBRILLATOR IMPLANT     Patient Instructions  Your physician has recommended that you have a [employer] defibrillator inserted. An implantable cardioverter defibrillator (ICD) is a small device that is placed in your chest or, in rare cases, your abdomen. This device uses electrical pulses or shocks to help control life-threatening, irregular heartbeats that could lead the heart to suddenly stop beating (sudden cardiac arrest). Leads are attached to the ICD that goes into your heart. This is done in the hospital and usually requires an overnight stay. Please see the instruction sheet given to you today for more information.  Your physician recommends that you return for lab work in: [place] ICD IMPLANT          Signed, Alexander Klein, MD  04/21/2015 9:12 AM    Alexander Klein, MD, [facility] [phone] office [phone] pager

## 2015-04-23 NOTE — Interval H&P Note (Signed)
History and Physical Interval Note:  04/23/2015 2:31 PM  Alexander Higgins  has presented today for surgery, with the diagnosis of hf  The various methods of treatment have been discussed with the patient and family. After consideration of risks, benefits and other options for treatment, the patient has consented to  Procedure(s): ICD Implant (N/A) as a surgical intervention .  The patient's history has been reviewed, patient examined, no change in status, stable for surgery.  I have reviewed the patient's chart and labs.  Questions were answered to the patient's satisfaction.     Stephaney Steven

## 2015-04-23 NOTE — Op Note (Signed)
Procedure report  Procedure performed:  1. Implantation of new dual chamber cardioverter defibrillator 2. Fluoroscopy 3. Moderate sedation 4. 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 The patient was prepped and draped in usual sterile fashion. Local anesthesia with 1% lidocaine was administered to to the left infraclavicular area. A 5-6 cm horizontal incision was made parallel with and 2-3 cm caudal to the left clavicle. Using electrocautery and blunt dissection a prepectoral pocket was created down to the level of the pectoralis major muscle fascia. The pocket was carefully inspected for hemostasis. An antibiotic-soaked sponge was placed in the pocket.  Under fluoroscopic guidance and using the modified Seldinger technique 2 separate venipunctures were performed to access the left subclavian vein. No difficulty was encountered accessing the vein.  Two J-tip guidewires were subsequently exchanged for  a 9.71F and 86F [place] safe sheaths.  Under fluoroscopic guidance the ventricular lead was advanced to level of the mid to apical right ventricular septum and thet active-fixation helix was deployed. Prominent current of injury was seen. Satisfactory pacing and sensing parameters were recorded. There was no evidence of diaphragmatic stimulation at maximum device output. The safe sheath was peeled away and the lead was secured in place with 2-0 silk.  In similar fashion the right atrial lead was advanced to the level of the atrial appendage. The active-fixation helix was deployed. There was prominent current of injury. Satisfactory  pacing and sensing parameters were recorded. There was no evidence of diaphragmatic stimulation with pacing at maximum device output. The safe sheath was peeled away and the lead was secured in place with 2-0 silk.  The antibiotic-soaked sponge was removed from the pocket. The pocket was flushed with copious amounts of antibiotic solution. Reinspection showed excellent hemostasis.  The ventricular lead was connected to the generator and appropriate ventricular pacing was seen. Subsequently the atrial lead was also connected. Repeat testing of the lead parameters later showed excellent values.  The entire system was then carefully inserted in the pocket with care been taking that the leads and device assumed a comfortable position without pressure on the incision. Great care was taken that the leads be located deep to the generator. The pocket was then closed in layers using 2 layers of 2-0 Vicryl and cutaneous staples, after which a sterile dressing was applied.  Defibrillation threshold testing was then performed. After adequate sedation was achieved, ventricular fibrillation was induced with a 1J shock on T method. There was appropriate sensing by the device. There were no dropouts during "least sensitive" settings. The arrhythmia was terminated by a single 14J shock. High  voltage impedance during  the shock was 66 ohm. Retesting of the leads confirmed normal function.  At the end of the procedure the following lead parameters were encountered:  Right atrial lead  sensed P waves 1.9 V, impedance 862 ohms, threshold 1.0 V at 0.5 ms pulse width.  Right ventricular lead sensed R waves 7.2 mV, impedance 963ohms, threshold 0.9 V at 0.5 ms pulse width.  High voltage impedance 69 ohm, charge time 2.4 s Sanda Klein, MD, [facility] [phone] office [phone] pager

## 2015-04-23 NOTE — Progress Notes (Signed)
ICD Criteria  Current LVEF:30%. Within 12 months prior to implant: Yes   Heart failure history: Yes, Class II  Cardiomyopathy history: Yes, Non-Ischemic Cardiomyopathy.  Atrial Fibrillation/Atrial Flutter: No.  Ventricular tachycardia history: No.  Cardiac arrest history: No.  History of syndromes with risk of sudden death: No.  Previous ICD: No.  Current ICD indication: Primary  PPM indication: Yes. Pacing type: Atrial. Greater than 40% RV pacing requirement anticipated. Indication: Sick Sinus Syndrome   Class I or II Bradycardia indication present: Yes  Beta Blocker therapy for 3 or more months: Yes, prescribed.   Ace Inhibitor/ARB therapy for 3 or more months: Yes, prescribed.

## 2015-04-24 ENCOUNTER — Ambulatory Visit (HOSPITAL_COMMUNITY): Payer: Medicare Other

## 2015-04-24 ENCOUNTER — Encounter (HOSPITAL_COMMUNITY): Payer: Self-pay | Admitting: Cardiovascular Disease

## 2015-04-24 DIAGNOSIS — Z45018 Encounter for adjustment and management of other part of cardiac pacemaker: Secondary | ICD-10-CM | POA: Diagnosis not present

## 2015-04-24 DIAGNOSIS — Z7982 Long term (current) use of aspirin: Secondary | ICD-10-CM | POA: Diagnosis not present

## 2015-04-24 DIAGNOSIS — Z79899 Other long term (current) drug therapy: Secondary | ICD-10-CM | POA: Diagnosis not present

## 2015-04-24 DIAGNOSIS — I429 Cardiomyopathy, unspecified: Secondary | ICD-10-CM | POA: Diagnosis not present

## 2015-04-24 DIAGNOSIS — E785 Hyperlipidemia, unspecified: Secondary | ICD-10-CM | POA: Diagnosis not present

## 2015-04-24 DIAGNOSIS — Z8249 Family history of ischemic heart disease and other diseases of the circulatory system: Secondary | ICD-10-CM | POA: Diagnosis not present

## 2015-04-24 DIAGNOSIS — I10 Essential (primary) hypertension: Secondary | ICD-10-CM | POA: Diagnosis not present

## 2015-04-24 DIAGNOSIS — I5022 Chronic systolic (congestive) heart failure: Secondary | ICD-10-CM | POA: Diagnosis not present

## 2015-04-24 MED FILL — Sodium Chloride Irrigation Soln 0.9%: Qty: 500 | Status: AC

## 2015-04-24 MED FILL — Gentamicin Sulfate Inj 40 MG/ML: INTRAMUSCULAR | Qty: 2 | Status: AC

## 2015-04-24 MED FILL — Lidocaine HCl Local Preservative Free (PF) Inj 1%: INTRAMUSCULAR | Qty: 30 | Status: AC

## 2015-04-24 NOTE — Discharge Instructions (Signed)

## 2015-04-24 NOTE — Discharge Summary (Signed)
Discharge Summary   Patient ID: Alexander Higgins,  MRN: [id number], DOB/AGE: [birth year] 69 y.o.  Admit date: 04/23/2015 Discharge date: 04/24/2015  Primary Care Provider: Garnet Koyanagi Primary Cardiologist: Quay Burow, MD; ICD: Dr. Sallyanne Kuster  Discharge Diagnoses Principal Problem:   At risk for sudden cardiac death Active Problems:   Systolic CHF (HCC)   Symptomatic sinus bradycardia   Non-ischemic cardiomyopathy ([place])   ICD (implantable cardioverter-defibrillator) in place   Allergies No Known Allergies  Consultant: None  Procedures  ICD Implant 04/23/15 Reason for procedure:  Primary prevention of sudden cardiac death Nonischemic cardiomyopathy, left ventricular ejection fraction less than 35%, Heart failure NYHA class 2, on comprehensive medical therapy for over 90 days (SCD-HeFT) Symptomatic sinus bradycardia  Device details:  [place] ICD DR model D142 serial number [id number] Right atrial lead [place] MRI model [id number] serial number [id number] Right ventricular lead [employer] Reliance SG model [phone] serial number [id number]  History of Present Illness  Alexander Higgins is a 69 y.o. male who presented 04/23/15 for  Outpatient ICD implantation. He was initially diagnosed with CHF in June 2016, when his echo showed LVEF 20-25%. After > 3 months of comprehensive medical therapy (max dose ACEi + ARB and carvedilol), LVEF has improved, but remains moderate to severely depresed at 30-35% by repeat echo on 10/21. The left ventricle is dilated. He has not had angina and his nuclear perfusion study was normal in July 2016. His ECG shows narrow QRS complex, no Q waves, no ST-T changes.  He generally feels well, functional class I-II. Only complaint is occasional fatigue. Likes to workout at the gym. Snores, but does not have other overt features to suggest obstructive sleep apnea. Seen by Dr. Loletha Grayer for ICD implant and felt  that he is a good candidate for dual chamber device due to fatigue, possibly beta blocker-related or due to heart failure. He has sinus bradycardia and is in his late 64s.  Hospital Course  The patient has s/p [employer] dual chamber ICD. He recovered well without any complications. CXR normal. Appears euvolemic. Device check normal.   She has been seen by Dr. Renaee Munda today and deemed ready for discharge home. All follow-up appointments have been scheduled. Discharge medications are listed below.   Continue ASA 61mb, Coreg 25mg  BID, lasix 40mg  BID and Valsartan 160mg  at discharge. Wound check 7-10 days. Office visit 4-6 weeks.   Discharge Vitals Blood pressure 123/89, pulse 60, temperature 97.8 F (36.6 C), temperature source Oral, resp. rate 18, height 6' (1.829 m), weight 282 lb 3 oz (128 kg), SpO2 98 %.  Filed Weights   04/23/15 1203 04/24/15 0428  Weight: 280 lb (127.007 kg) 282 lb 3 oz (128 kg)    CBC  Recent Labs  04/21/15 0928  WBC 5.9  HGB 15.5  HCT 43.5  MCV 88.8  PLT 99991111   Basic Metabolic Panel  Recent Labs  04/21/15 0928  NA 141  K 4.0  CL 104  CO2 27  GLUCOSE 92  BUN 15  CREATININE 1.04  CALCIUM 8.7   Liver Function Tests  Recent Labs  04/21/15 0928  AST 30  ALT 26  ALKPHOS 52  BILITOT 0.5  PROT 7.2  ALBUMIN 3.8    Disposition  Pt is being discharged home today in good condition.  Follow-up Plans & Appointments  Follow-up Information    Follow up with [facility]. Go on 05/05/2015.   Specialty:  Cardiology  Why:  @4 :00pm  for wound check at [facility] street office.    Contact information:   [address] [place] [phone]      Follow up with Sanda Klein, MD. Go on 06/06/2015.   Specialty:  Cardiology   Why:  @ 8:00 am    Contact information:   [address] [place] [address] [phone]           Discharge Instructions    Diet  - low sodium heart healthy    Complete by:  As directed      Increase activity slowly    Complete by:  As directed          F/u Labs/Studies: None  Discharge Medications    Medication List    TAKE these medications        aspirin EC 81 MG tablet  Take 81 mg by mouth daily.     carvedilol 25 MG tablet  Commonly known as:  COREG  Take 1 tablet (25 mg total) by mouth 2 (two) times daily with a meal.     furosemide 40 MG tablet  Commonly known as:  LASIX  Take 1 tablet (40 mg total) by mouth 2 (two) times daily.     valsartan 160 MG tablet  Commonly known as:  DIOVAN  Take 1 tablet (160 mg total) by mouth daily.        Duration of Discharge Encounter   Greater than 30 minutes including physician time.  Signed, Patience Nuzzo PA-C 04/24/2015, 8:47 AM

## 2015-04-24 NOTE — Progress Notes (Signed)
Patient Name: Alexander Higgins Date of Encounter: 04/24/2015   SUBJECTIVE  Feeling well. No chest pain, sob or palpitations.   CURRENT MEDS . aspirin EC  81 mg Oral Daily  . carvedilol  25 mg Oral BID WC  .  ceFAZolin (ANCEF) IV  1 g Intravenous Q6H  . furosemide  40 mg Oral BID  . irbesartan  150 mg Oral Daily    OBJECTIVE  Filed Vitals:   04/23/15 2200 04/24/15 0000 04/24/15 0200 04/24/15 0428  BP: 116/75 117/66 112/83 123/89  Pulse: 59 59  60  Temp:    97.8 F (36.6 C)  TempSrc:    Oral  Resp: 21 15  18   Height:      Weight:    282 lb 3 oz (128 kg)  SpO2: 92% 96%  98%    Intake/Output Summary (Last 24 hours) at 04/24/15 0750 Last data filed at 04/23/15 2000  Gross per 24 hour  Intake    360 ml  Output    650 ml  Net   -290 ml   Filed Weights   04/23/15 1203 04/24/15 0428  Weight: 280 lb (127.007 kg) 282 lb 3 oz (128 kg)    PHYSICAL EXAM  General: Pleasant, NAD. Neuro: Alert and oriented X 3. Moves all extremities spontaneously. Psych: Normal affect. HEENT:  Normal  Neck: Supple without bruits or JVD. Lungs:  Resp regular and unlabored, CTA. Incision site without erythema or drainage.  Heart: RRR no s3, s4, or murmurs. Abdomen: Soft, non-tender, non-distended, BS + x 4.  Extremities: No clubbing, cyanosis or edema. DP/PT/Radials 2+ and equal bilaterally.  Accessory Clinical Findings  CBC  Recent Labs  04/21/15 0928  WBC 5.9  HGB 15.5  HCT 43.5  MCV 88.8  PLT 99991111   Basic Metabolic Panel  Recent Labs  04/21/15 0928  NA 141  K 4.0  CL 104  CO2 27  GLUCOSE 92  BUN 15  CREATININE 1.04  CALCIUM 8.7   Liver Function Tests  Recent Labs  04/21/15 0928  AST 30  ALT 26  ALKPHOS 52  BILITOT 0.5  PROT 7.2  ALBUMIN 3.8    TELE  A paced rhythm   ICD Implant 04/23/15 Reason for procedure:  Primary prevention of sudden cardiac death Nonischemic cardiomyopathy, left ventricular ejection fraction less than 35%, Heart failure  NYHA class 2, on comprehensive medical therapy for over 90 days (SCD-HeFT) Symptomatic sinus bradycardia  Device details:  [place] ICD DR model D142 serial number [id number] Right atrial lead [employer] MRI model [id number] serial number [id number] Right ventricular lead [employer] Reliance SG model [phone] serial number [id number]    Radiology/Studies  Dg Chest 2 View  04/24/2015  CLINICAL DATA:  Pacemaker placement EXAM: CHEST  2 VIEW COMPARISON:  11/28/2014 FINDINGS: Interval placement of left subclavian dual lead AICD pacemaker with leads in the right atrium and right ventricle. Negative for pneumothorax. Cardiac enlargement without heart failure. Mild atelectasis in the lung bases. No pleural effusion. IMPRESSION: Satisfactory AICD placement Mild bibasilar atelectasis. Electronically Signed   By: Franchot Gallo M.D.   On: 04/24/2015 07:00    ASSESSMENT AND PLAN Principal Problem:   At risk for sudden cardiac death Active Problems:   Systolic CHF ([place])   Symptomatic sinus bradycardia   Non-ischemic cardiomyopathy ([place])   ICD (implantable cardioverter-defibrillator) in place    Plan: s/p [employer] dual chamber ICD. A paced rhythm at rate of 60s.  CXR normal. Continue ASA 32mb, Coreg 25mg  BID, lasix 40mg  BID and Valsartan 160mg  at discharge. Appears euvolemic. Pending device check.   Signed, Bhagat,Bhavinkumar PA-C Pager [phone]  I have seen and examined the patient along with Bhagat,Bhavinkumar PA-C.  I have reviewed the chart, notes and new data.  I agree with PA's note.  Key new complaints: feels less dyspneic already (brady pacing?) Key examination changes: wound looks healthy Key new findings / data: CXR OK, as is device check  PLAN: Wound check 7-10 d. Office check 4-6 weeks. Wound care discussed.  Sanda Klein, MD, [place] [phone] 04/24/2015, 7:59 AM

## 2015-04-25 ENCOUNTER — Telehealth: Payer: Self-pay | Admitting: Cardiovascular Disease

## 2015-04-25 ENCOUNTER — Encounter (HOSPITAL_COMMUNITY): Payer: Self-pay | Admitting: Cardiovascular Disease

## 2015-04-25 NOTE — Telephone Encounter (Signed)
Yes please. Just a loosely secured 4x4, changed daily

## 2015-04-25 NOTE — Telephone Encounter (Signed)
Pt had defibrillator put in 2 days ago,she have some questions.

## 2015-04-25 NOTE — Telephone Encounter (Signed)
Returned call to patient's daughter.She stated she changed father's ICD dressing.Stated site looks good no drainage,slightly swollen.Stated she wanted to know if she needs to put 4x4's back over staples.Message sent to Dr.Croitoru for advice.

## 2015-04-25 NOTE — Telephone Encounter (Signed)
Returned call to patient's daughter.Dr.Croitoru advised to cover loosely with a 4x4, to be changed daily.

## 2015-05-05 ENCOUNTER — Ambulatory Visit (INDEPENDENT_AMBULATORY_CARE_PROVIDER_SITE_OTHER): Payer: Medicare Other | Admitting: *Deleted

## 2015-05-05 DIAGNOSIS — Z95810 Presence of automatic (implantable) cardiac defibrillator: Secondary | ICD-10-CM | POA: Diagnosis not present

## 2015-05-05 DIAGNOSIS — I519 Heart disease, unspecified: Secondary | ICD-10-CM

## 2015-05-05 DIAGNOSIS — I429 Cardiomyopathy, unspecified: Secondary | ICD-10-CM

## 2015-05-05 DIAGNOSIS — I428 Other cardiomyopathies: Secondary | ICD-10-CM

## 2015-05-05 LAB — CUP PACEART INCLINIC DEVICE CHECK
Brady Statistic RA Percent Paced: 45 %
HIGH POWER IMPEDANCE MEASURED VALUE: 68 Ohm
HighPow Impedance: 66 Ohm
Implantable Lead Implant Date: 20161109
Implantable Lead Location: 753860
Implantable Lead Model: 293
Implantable Lead Model: 7741
Lead Channel Impedance Value: 690 Ohm
Lead Channel Pacing Threshold Pulse Width: 0.4 ms
Lead Channel Pacing Threshold Pulse Width: 0.4 ms
Lead Channel Sensing Intrinsic Amplitude: 1.8 mV
Lead Channel Setting Pacing Amplitude: 3.5 V
Lead Channel Setting Pacing Amplitude: 3.5 V
MDC IDC LEAD IMPLANT DT: 20161109
MDC IDC LEAD LOCATION: 753859
MDC IDC LEAD SERIAL: 385866
MDC IDC LEAD SERIAL: 710559
MDC IDC MSMT LEADCHNL RA PACING THRESHOLD AMPLITUDE: 0.8 V
MDC IDC MSMT LEADCHNL RV IMPEDANCE VALUE: 442 Ohm
MDC IDC MSMT LEADCHNL RV PACING THRESHOLD AMPLITUDE: 0.8 V
MDC IDC MSMT LEADCHNL RV SENSING INTR AMPL: 16.9 mV
MDC IDC PG SERIAL: 512157
MDC IDC SESS DTM: 20161121050000
MDC IDC SET LEADCHNL RV PACING PULSEWIDTH: 0.4 ms
MDC IDC SET LEADCHNL RV SENSING SENSITIVITY: 0.6 mV
MDC IDC STAT BRADY RV PERCENT PACED: 1 % — AB

## 2015-05-05 NOTE — Progress Notes (Signed)
Wound check appointment. Staples removed. Wound without redness or edema. Incision edges approximated, wound well healed. Normal device function. Thresholds, sensing, and impedances consistent with implant measurements. Device programmed at 3.5V for extra safety margin until 3 month visit. Histogram distribution appropriate for patient and level of activity. 1 ATR- 4 seconds. No ventricular arrhythmias noted. Patient educated about wound care, arm mobility, lifting restrictions, shock plan. ROV with [facility] 06/06/15.

## 2015-05-23 ENCOUNTER — Encounter: Payer: Self-pay | Admitting: Cardiovascular Disease

## 2015-06-06 ENCOUNTER — Ambulatory Visit (INDEPENDENT_AMBULATORY_CARE_PROVIDER_SITE_OTHER): Payer: Medicare Other | Admitting: Cardiovascular Disease

## 2015-06-06 ENCOUNTER — Encounter: Payer: Self-pay | Admitting: Cardiovascular Disease

## 2015-06-06 VITALS — BP 140/88 | HR 71 | Ht 72.0 in | Wt 290.1 lb

## 2015-06-06 DIAGNOSIS — Z95810 Presence of automatic (implantable) cardiac defibrillator: Secondary | ICD-10-CM

## 2015-06-06 DIAGNOSIS — R001 Bradycardia, unspecified: Secondary | ICD-10-CM | POA: Diagnosis not present

## 2015-06-06 DIAGNOSIS — I429 Cardiomyopathy, unspecified: Secondary | ICD-10-CM

## 2015-06-06 DIAGNOSIS — I428 Other cardiomyopathies: Secondary | ICD-10-CM

## 2015-06-06 NOTE — Progress Notes (Signed)
Patient ID: Alexander Higgins, male   DOB: 1946/03/13, 69 y.o.   MRN: [id number]     Cardiology Office Note    Date:  06/06/2015   ID:  Alexander Higgins, DOB April 14, 1946, MRN [id number]  PCP:  Garnet Koyanagi, DO  Cardiologist:   Quay Burow, M.D.; Sanda Klein, MD   Chief complaint:  Follow-up after ICD implantation   History of Present Illness:  Alexander Higgins is a 69 y.o. male , roughly 6 weeks status post implantation of a [employer] dual-chamber defibrillator for severe nonischemic cardiomyopathy with a left ventricular ejection fraction of approximately 30%  and symptomatic sinus bradycardia.   the surgical site is well-healed. He has been physically active without any complaints. He denies angina or dyspnea. He does report some reduction in ability to maintain erections following increases in his heart failure medications. He has no other complaints.    Past Medical History  Diagnosis Date  . Hypertension   . LV dysfunction     systolic heart failure  . Nonischemic cardiomyopathy ([place])   . Hyperlipidemia   . AICD (automatic cardioverter/defibrillator) present   . CHF (congestive heart failure) ([place]) dx'd 11/2014  . Arthritis     "right knee" (04/23/2015)    Past Surgical History  Procedure Laterality Date  . Tonsillectomy    . Insertion of icd  04/23/2015  . Ep implantable device N/A 04/23/2015    Procedure: ICD Implant;  Surgeon: Sanda Klein, MD;  Location: [facility];  Service: Cardiovascular;  Laterality: N/A;    Current Outpatient Prescriptions  Medication Sig Dispense Refill  . aspirin EC 81 MG tablet Take 81 mg by mouth daily.    . carvedilol (COREG) 25 MG tablet Take 1 tablet (25 mg total) by mouth 2 (two) times daily with a meal. 180 tablet 3  . furosemide (LASIX) 40 MG tablet Take 1 tablet (40 mg total) by mouth 2 (two) times daily. 180 tablet 3  . valsartan (DIOVAN) 160 MG tablet Take 1 tablet (160 mg total) by mouth daily. 30 tablet 2   No  current facility-administered medications for this visit.    Allergies:   Review of patient's allergies indicates no known allergies.   Social History   Social History  . Marital Status: Divorced    Spouse Name: N/A  . Number of Children: N/A  . Years of Education: N/A   Social History Main Topics  . Smoking status: Never Smoker   . Smokeless tobacco: Never Used  . Alcohol Use: No  . Drug Use: No  . Sexual Activity: Yes   Other Topics Concern  . None   Social History Narrative     Family History:  The patient's family history includes Heart attack in his brother; Heart disease in his mother; Hypertension in his brother and mother; Stroke in his sister.   ROS:   Please see the history of present illness.    ROS All other systems reviewed and are negative.   PHYSICAL EXAM:   VS:  BP 140/88 mmHg  Pulse 71  Ht 6' (1.829 m)  Wt 290 lb 1.6 oz (131.588 kg)  BMI 39.34 kg/m2  SpO2 95%   GEN: Well nourished, well developed, in no acute distress HEENT: normal Neck: no JVD, carotid bruits, or masses Cardiac: RRR; no murmurs, rubs, or gallops,no edema ;  Healthy left subclavian ICD site Respiratory:  clear to auscultation bilaterally, normal work of breathing GI: soft, nontender, nondistended, + BS MS: no deformity or atrophy  Skin: warm and dry, no rash Neuro:  Alert and Oriented x 3, Strength and sensation are intact Psych: euthymic mood, full affect  Wt Readings from Last 3 Encounters:  06/06/15 290 lb 1.6 oz (131.588 kg)  04/24/15 282 lb 3 oz (128 kg)  04/21/15 286 lb (129.729 kg)      Studies/Labs Reviewed:   EKG:  EKG is not ordered today.   Recent Labs: 04/21/2015: ALT 26; BUN 15; Creat 1.04; Hemoglobin 15.5; Platelets 186; Potassium 4.0; Sodium 141   Lipid Panel    Component Value Date/Time   CHOL 186 12/13/2014 1155   TRIG 117.0 12/13/2014 1155   HDL 42.90 12/13/2014 1155   CHOLHDL 4 12/13/2014 1155   VLDL 23.4 12/13/2014 1155   LDLCALC 120*  12/13/2014 1155      ASSESSMENT:    1. ICD (implantable cardioverter-defibrillator) in place   2. Non-ischemic cardiomyopathy ([place])   3. Symptomatic sinus bradycardia      PLAN:  In order of problems listed above:  1.  Normally functioning dual-chamber defibrillator. Excellent lead parameters. Outputs were reduced to chronic settings today. 2.  Well compensated combined systolic and diastolic heart failure on effective doses of ARB and beta blocker.  He will discuss waist to mitigate the side effects of these medications at his next appointment with Dr. Gwenlyn Found 3.  Heart rate histogram distribution is favorable. He has had 45% atrial pacing. Continue to accumulate histogram information before we turn on rate response sensor   Medication Adjustments/Labs and Tests Ordered: Current medicines are reviewed at length with the patient today.  Concerns regarding medicines are outlined above.  Medication changes, Labs and Tests ordered today are listed below. Patient Instructions  Your physician wants you to follow-up in: 12 Months You will receive a reminder letter in the mail two months in advance. If you don't receive a letter, please call our office to schedule the follow-up appointment.  Remote monitoring is used to monitor your Pacemaker of ICD from home. This monitoring reduces the number of office visits required to check your device to one time per year. It allows [place] to keep an eye on the functioning of your device to ensure it is working properly. You are scheduled for a device check from home on March 1st. You may send your transmission at any time that day. If you have a wireless device, the transmission will be sent automatically. After your physician reviews your transmission, you will receive a postcard with your next transmission date.  Merry Christmas and Happy New YearMikael Spray, MD  06/06/2015 9:13 AM    [facility], [place], [place]  [address] Phone: [phone]; Fax: [phone]

## 2015-06-06 NOTE — Patient Instructions (Signed)
Your physician wants you to follow-up in: 12 Months You will receive a reminder letter in the mail two months in advance. If you don't receive a letter, please call our office to schedule the follow-up appointment.  Remote monitoring is used to monitor your Pacemaker of ICD from home. This monitoring reduces the number of office visits required to check your device to one time per year. It allows [place] to keep an eye on the functioning of your device to ensure it is working properly. You are scheduled for a device check from home on March 1st. You may send your transmission at any time that day. If you have a wireless device, the transmission will be sent automatically. After your physician reviews your transmission, you will receive a postcard with your next transmission date.  Merry Christmas and Happy New Year!!

## 2015-07-14 ENCOUNTER — Other Ambulatory Visit: Payer: Self-pay | Admitting: Cardiovascular Disease

## 2015-07-28 ENCOUNTER — Telehealth: Payer: Self-pay | Admitting: Cardiovascular Disease

## 2015-07-28 NOTE — Telephone Encounter (Signed)
Spoke to patient He states he takes amlodipine 5mg , furosemide , valsartan 160mg  Patient had been taking carvedilol. medication was stopped and amlodipine was started  RN informed patient that the medication he is now on ,usually does not cause erectile  Dysfunction Informed patient to contact his primary

## 2015-07-28 NOTE — Telephone Encounter (Signed)
Pt says his blood pressure medicine have effected his sexual performance. He needs something for Erectile Dysfunction please.

## 2015-08-01 ENCOUNTER — Ambulatory Visit (INDEPENDENT_AMBULATORY_CARE_PROVIDER_SITE_OTHER): Payer: Medicare Other | Admitting: Family Medicine

## 2015-08-01 ENCOUNTER — Encounter: Payer: Self-pay | Admitting: Family Medicine

## 2015-08-01 VITALS — BP 140/72 | HR 74 | Temp 98.3°F | Ht 72.0 in | Wt 294.6 lb

## 2015-08-01 DIAGNOSIS — I428 Other cardiomyopathies: Secondary | ICD-10-CM

## 2015-08-01 DIAGNOSIS — Z0000 Encounter for general adult medical examination without abnormal findings: Secondary | ICD-10-CM

## 2015-08-01 DIAGNOSIS — I429 Cardiomyopathy, unspecified: Secondary | ICD-10-CM

## 2015-08-01 DIAGNOSIS — Z0001 Encounter for general adult medical examination with abnormal findings: Secondary | ICD-10-CM

## 2015-08-01 DIAGNOSIS — I5022 Chronic systolic (congestive) heart failure: Secondary | ICD-10-CM | POA: Diagnosis not present

## 2015-08-01 DIAGNOSIS — N522 Drug-induced erectile dysfunction: Secondary | ICD-10-CM | POA: Diagnosis not present

## 2015-08-01 DIAGNOSIS — Z23 Encounter for immunization: Secondary | ICD-10-CM | POA: Diagnosis not present

## 2015-08-01 DIAGNOSIS — I10 Essential (primary) hypertension: Secondary | ICD-10-CM

## 2015-08-01 DIAGNOSIS — Z1159 Encounter for screening for other viral diseases: Secondary | ICD-10-CM | POA: Diagnosis not present

## 2015-08-01 LAB — COMPREHENSIVE METABOLIC PANEL
ALBUMIN: 4.2 g/dL (ref 3.5–5.2)
ALT: 30 U/L (ref 0–53)
AST: 33 U/L (ref 0–37)
Alkaline Phosphatase: 48 U/L (ref 39–117)
BILIRUBIN TOTAL: 0.7 mg/dL (ref 0.2–1.2)
BUN: 17 mg/dL (ref 6–23)
CO2: 30 mEq/L (ref 19–32)
CREATININE: 1.16 mg/dL (ref 0.40–1.50)
Calcium: 9.5 mg/dL (ref 8.4–10.5)
Chloride: 103 mEq/L (ref 96–112)
GFR: 80.41 mL/min (ref >60.00)
Glucose, Bld: 104 mg/dL — ABNORMAL HIGH (ref 70–99)
Potassium: 4 mEq/L (ref 3.5–5.1)
SODIUM: 140 meq/L (ref 135–145)
TOTAL PROTEIN: 8.1 g/dL (ref 6.0–8.3)

## 2015-08-01 LAB — CBC WITH DIFFERENTIAL/PLATELET
Basophils Absolute: 0 10*3/uL (ref 0.0–0.1)
Basophils Relative: 0.5 % (ref 0.0–3.0)
EOS ABS: 0.1 10*3/uL (ref 0.0–0.7)
EOS PCT: 1.6 % (ref 0.0–5.0)
HCT: 45.9 % (ref 39.0–52.0)
HEMOGLOBIN: 15.7 g/dL (ref 13.0–17.0)
Lymphocytes Relative: 37.1 % (ref 12.0–46.0)
Lymphs Abs: 2.5 10*3/uL (ref 0.7–4.0)
MCHC: 34.2 g/dL (ref 30.0–36.0)
MCV: 91.3 fl (ref 78.0–100.0)
MONO ABS: 0.7 10*3/uL (ref 0.1–1.0)
Monocytes Relative: 9.9 % (ref 3.0–12.0)
Neutro Abs: 3.5 10*3/uL (ref 1.4–7.7)
Neutrophils Relative %: 50.9 % (ref 43.0–77.0)
Platelets: 186 10*3/uL (ref 150.0–400.0)
RBC: 5.03 Mil/uL (ref 4.22–5.81)
RDW: 13.8 % (ref 11.5–15.5)
WBC: 6.8 10*3/uL (ref 4.0–10.5)

## 2015-08-01 LAB — LIPID PANEL
Cholesterol: 247 mg/dL — ABNORMAL HIGH (ref 0–200)
HDL: 47.3 mg/dL (ref >39.00)
LDL Cholesterol: 177 mg/dL — ABNORMAL HIGH (ref 0–99)
NONHDL: 200.1
Total CHOL/HDL Ratio: 5
Triglycerides: 114 mg/dL (ref 0.0–149.0)
VLDL: 22.8 mg/dL (ref 0.0–40.0)

## 2015-08-01 LAB — MICROALBUMIN / CREATININE URINE RATIO
Creatinine,U: 259 mg/dL
MICROALB UR: 0.7 mg/dL (ref 0.0–1.9)
MICROALB/CREAT RATIO: 0.3 mg/g (ref 0.0–30.0)

## 2015-08-01 LAB — PSA: PSA: 0.48 ng/mL (ref 0.10–4.00)

## 2015-08-01 MED ORDER — TADALAFIL 20 MG PO TABS: 20.0000 mg | ORAL_TABLET | Freq: Every day | ORAL | Status: DC | PRN

## 2015-08-01 NOTE — Patient Instructions (Signed)

## 2015-08-01 NOTE — Progress Notes (Signed)
Subjective:   Alexander Higgins is a 70 y.o. male who presents for Medicare Annual/Subsequent preventive examination.  Review of Systems:   Review of Systems  Constitutional: Negative for activity change, appetite change and fatigue.  HENT: Negative for hearing loss, congestion, tinnitus and ear discharge.   Eyes: Negative for visual disturbance (see optho q1y -- vision corrected to 20/20 with glasses).  Respiratory: Negative for cough, chest tightness and shortness of breath.   Cardiovascular: Negative for chest pain, palpitations and leg swelling.  Gastrointestinal: Negative for abdominal pain, diarrhea, constipation and abdominal distention.  Genitourinary: Negative for urgency, frequency, decreased urine volume and difficulty urinating.  Musculoskeletal: Negative for back pain, arthralgias and gait problem.  Skin: Negative for color change, pallor and rash.  Neurological: Negative for dizziness, light-headedness, numbness and headaches.  Hematological: Negative for adenopathy. Does not bruise/bleed easily.  Psychiatric/Behavioral: Negative for suicidal ideas, confusion, sleep disturbance, self-injury, dysphoric mood, decreased concentration and agitation.  Pt is able to read and write and can do all ADLs No risk for falling No abuse/ violence in home           Objective:    Vitals: BP 140/72 mmHg  Pulse 74  Temp(Src) 98.3 F (36.8 C) (Oral)  Ht 6' (1.829 m)  Wt 294 lb 9.6 oz (133.63 kg)  BMI 39.95 kg/m2  SpO2 98% BP 140/72 mmHg  Pulse 74  Temp(Src) 98.3 F (36.8 C) (Oral)  Ht 6' (1.829 m)  Wt 294 lb 9.6 oz (133.63 kg)  BMI 39.95 kg/m2  SpO2 98% General appearance: alert, cooperative, appears stated age and no distress Head: Normocephalic, without obvious abnormality, atraumatic Eyes: conjunctivae/corneas clear. PERRL, EOM's intact. Fundi benign. Ears: normal TM's and external ear canals both ears Nose: Nares normal. Septum midline. Mucosa normal. No drainage or  sinus tenderness. Throat: lips, mucosa, and tongue normal; teeth and gums normal Neck: no adenopathy, no carotid bruit, no JVD, supple, symmetrical, trachea midline and thyroid not enlarged, symmetric, no tenderness/mass/nodules Back: symmetric, no curvature. ROM normal. No CVA tenderness. Lungs: clear to auscultation bilaterally Chest wall: no tenderness Heart: S1, S2 normal Abdomen: soft, non-tender; bowel sounds normal; no masses,  no organomegaly Male genitalia: normal Rectal: normal tone, normal prostate, no masses or tenderness Extremities: extremities normal, atraumatic, no cyanosis or edema Pulses: 2+ and symmetric Skin: Skin color, texture, turgor normal. No rashes or lesions Lymph nodes: Cervical, supraclavicular, and axillary nodes normal. Neurologic: Alert and oriented X 3, normal strength and tone. Normal symmetric reflexes. Normal coordination and gait Psych- no depression, no anxiety Tobacco History  Smoking status  . Never Smoker   Smokeless tobacco  . Never Used     Counseling given: Not Answered   Past Medical History  Diagnosis Date  . Hypertension   . LV dysfunction     systolic heart failure  . Nonischemic cardiomyopathy ([place])   . Hyperlipidemia   . AICD (automatic cardioverter/defibrillator) present   . CHF (congestive heart failure) ([place]) dx'd 11/2014  . Arthritis     "right knee" (04/23/2015)   Past Surgical History  Procedure Laterality Date  . Tonsillectomy    . Insertion of icd  04/23/2015  . Ep implantable device N/A 04/23/2015    Procedure: ICD Implant;  Surgeon: Sanda Klein, MD;  Location: [facility];  Service: Cardiovascular;  Laterality: N/A;   Family History  Problem Relation Age of Onset  . Hypertension Mother   . Hypertension Brother   . Heart disease Mother   .  Heart attack Brother   . Stroke Sister    History  Sexual Activity  . Sexual Activity: Yes    Outpatient Encounter Prescriptions as of 08/01/2015  Medication  Sig  . amLODipine (NORVASC) 5 MG tablet Take 1 tablet by mouth daily.  Marland Kitchen aspirin EC 81 MG tablet Take 81 mg by mouth daily.  . furosemide (LASIX) 40 MG tablet Take 1 tablet (40 mg total) by mouth 2 (two) times daily.  . valsartan (DIOVAN) 160 MG tablet TAKE ONE TABLET BY MOUTH DAILY  . tadalafil (CIALIS) 20 MG tablet Take 1 tablet (20 mg total) by mouth daily as needed for erectile dysfunction.  . [DISCONTINUED] carvedilol (COREG) 25 MG tablet Take 1 tablet (25 mg total) by mouth 2 (two) times daily with a meal.   No facility-administered encounter medications on file as of 08/01/2015.    Activities of Daily Living In your present state of health, do you have any difficulty performing the following activities: 08/01/2015 04/23/2015  Hearing? N -  Vision? N -  Difficulty concentrating or making decisions? N -  Walking or climbing stairs? N -  Dressing or bathing? N -  Doing errands, shopping? N N    Patient Care Team: Rosalita Chessman, DO as PCP - General (Family Medicine) Lorretta Harp, MD as Consulting Physician (Cardiology)   Assessment:    CPE Exercise Activities and Dietary recommendations----gym-- weights, low weights, walking 2 miles in am     Goals    None     Fall Risk Fall Risk  08/01/2015 11/28/2014  Falls in the past year? No No   Depression Screen PHQ 2/9 Scores 08/01/2015 11/28/2014  PHQ - 2 Score 0 0    Cognitive Testing No flowsheet data found.  Immunization History  Administered Date(s) Administered  . Influenza,inj,Quad PF,36+ Mos 03/10/2015  . Pneumococcal Conjugate-13 08/01/2015  . Tdap 11/19/2014   Screening Tests Health Maintenance  Topic Date Due  . Hepatitis C Screening  [birth year]  . ZOSTAVAX  02/04/2007  . INFLUENZA VACCINE  01/13/2016  . PNA vac Low Risk Adult (2 of 2 - PPSV23) 07/31/2016  . COLONOSCOPY  06/15/2019  . TETANUS/TDAP  11/18/2024      Plan:    During the course of the visit the patient was educated and counseled about  the following appropriate screening and preventive services:   Vaccines to include Pneumoccal, Influenza, Hepatitis B, Td, Zostavax, HCV  Electrocardiogram  Cardiovascular Disease  Colorectal cancer screening  Diabetes screening  Prostate Cancer Screening  Glaucoma screening  Nutrition counseling   Smoking cessation counseling  Patient Instructions (the written plan) was given to the patient.  1. Need for pneumococcal vaccination  - Pneumococcal conjugate vaccine 13-valent  2. Essential hypertension Stable  Current outpatient prescriptions:  .  amLODipine (NORVASC) 5 MG tablet, Take 1 tablet by mouth daily., Disp: , Rfl:  .  aspirin EC 81 MG tablet, Take 81 mg by mouth daily., Disp: , Rfl:  .  furosemide (LASIX) 40 MG tablet, Take 1 tablet (40 mg total) by mouth 2 (two) times daily., Disp: 180 tablet, Rfl: 3 .  valsartan (DIOVAN) 160 MG tablet, TAKE ONE TABLET BY MOUTH DAILY, Disp: 30 tablet, Rfl: 1 .  tadalafil (CIALIS) 20 MG tablet, Take 1 tablet (20 mg total) by mouth daily as needed for erectile dysfunction., Disp: 10 tablet, Rfl: 0 - POCT urinalysis dipstick - PSA - Microalbumin / creatinine urine ratio - Lipid panel - CBC with Differential/Platelet - Comp  Met (CMET)  3. Nonischemic cardiomyopathy (HCC)   - POCT urinalysis dipstick - PSA - Microalbumin / creatinine urine ratio - Lipid panel - CBC with Differential/Platelet - Comp Met (CMET)  4. Chronic systolic CHF (congestive heart failure) (HCC)   - POCT urinalysis dipstick - PSA - Microalbumin / creatinine urine ratio - Lipid panel - CBC with Differential/Platelet - Comp Met (CMET)  5. Need for hepatitis C screening test   - Hepatitis C antibody  6. Drug-induced erectile dysfunction   - tadalafil (CIALIS) 20 MG tablet; Take 1 tablet (20 mg total) by mouth daily as needed for erectile dysfunction.  Dispense: 10 tablet; Refill: 0  7. Preventative health care  See avs Check labs ghm  utd   Garnet Koyanagi, DO  08/01/2015

## 2015-08-01 NOTE — Progress Notes (Signed)
Pre visit review using our clinic review tool, if applicable. No additional management support is needed unless otherwise documented below in the visit note. 

## 2015-08-02 LAB — HEPATITIS C ANTIBODY: HCV AB: NEGATIVE

## 2015-08-04 LAB — TESTOSTERONE, FREE, TOTAL, SHBG
Sex Hormone Binding: 34 nmol/L (ref 22–77)
TESTOSTERONE FREE: 40.5 pg/mL — AB (ref 47.0–244.0)
Testosterone-% Free: 1.9 % (ref 1.6–2.9)
Testosterone: 216 ng/dL — ABNORMAL LOW (ref 250–827)

## 2015-08-06 ENCOUNTER — Telehealth: Payer: Self-pay | Admitting: Cardiovascular Disease

## 2015-08-06 NOTE — Telephone Encounter (Signed)
New message  Pt called needed a copy of the letter from 04/19/2016. This was when he was scheduled to have the Implantable Cardioverter Defibrillator. He states that he will need this letter for his documents. Please call back to discuss

## 2015-08-06 NOTE — Telephone Encounter (Signed)
Daughter called again. She would like if possible the letter be faxed tonight before 7:30 to-[phone]-Att.[name]

## 2015-08-06 NOTE — Telephone Encounter (Signed)
Returned call to patient directly and obtained a fax # of [phone]. A copy of his instructional letter was sent to the fax # provided. Pt aware to call if not received this afternoon.

## 2015-08-08 ENCOUNTER — Telehealth: Payer: Self-pay

## 2015-08-08 DIAGNOSIS — E7800 Pure hypercholesterolemia, unspecified: Secondary | ICD-10-CM

## 2015-08-08 DIAGNOSIS — I10 Essential (primary) hypertension: Secondary | ICD-10-CM

## 2015-08-08 DIAGNOSIS — R7989 Other specified abnormal findings of blood chemistry: Secondary | ICD-10-CM

## 2015-08-08 NOTE — Telephone Encounter (Signed)
-----   Message from Rosalita Chessman, DO sent at 08/04/2015  8:37 PM EST ----- Testosterone is low--- refer to urology for replacement Cholesterol--- LDL goal < 100,  HDL >40,  TG < 150.  Diet and exercise will increase HDL and decrease LDL and TG.  Fish,  Fish Oil, Flaxseed oil will also help increase the HDL and decrease Triglycerides.   Recheck labs in 3 months----- lipid, cmp.

## 2015-08-08 NOTE — Telephone Encounter (Signed)
Pt notified and made aware.  Agrees with plan.  Urology referral placed.  Labs ordered lab appt scheduled.

## 2015-09-16 ENCOUNTER — Other Ambulatory Visit: Payer: Self-pay | Admitting: Cardiovascular Disease

## 2015-09-17 NOTE — Telephone Encounter (Signed)
Rx(s) sent to pharmacy electronically.  

## 2015-09-23 DIAGNOSIS — E291 Testicular hypofunction: Secondary | ICD-10-CM | POA: Diagnosis not present

## 2015-09-23 DIAGNOSIS — Z0000 Encounter for general adult medical examination without abnormal findings: Secondary | ICD-10-CM | POA: Diagnosis not present

## 2015-09-23 DIAGNOSIS — N5201 Erectile dysfunction due to arterial insufficiency: Secondary | ICD-10-CM | POA: Diagnosis not present

## 2015-10-28 ENCOUNTER — Ambulatory Visit (INDEPENDENT_AMBULATORY_CARE_PROVIDER_SITE_OTHER): Payer: Medicare Other | Admitting: Cardiovascular Disease

## 2015-10-28 ENCOUNTER — Encounter: Payer: Self-pay | Admitting: Cardiovascular Disease

## 2015-10-28 ENCOUNTER — Other Ambulatory Visit: Payer: Self-pay | Admitting: Physician Assistant

## 2015-10-28 ENCOUNTER — Telehealth: Payer: Self-pay | Admitting: Cardiovascular Disease

## 2015-10-28 VITALS — BP 116/88 | HR 66 | Ht 72.0 in | Wt 297.5 lb

## 2015-10-28 DIAGNOSIS — I10 Essential (primary) hypertension: Secondary | ICD-10-CM

## 2015-10-28 DIAGNOSIS — Z79899 Other long term (current) drug therapy: Secondary | ICD-10-CM | POA: Diagnosis not present

## 2015-10-28 DIAGNOSIS — I5022 Chronic systolic (congestive) heart failure: Secondary | ICD-10-CM | POA: Diagnosis not present

## 2015-10-28 DIAGNOSIS — E785 Hyperlipidemia, unspecified: Secondary | ICD-10-CM | POA: Diagnosis not present

## 2015-10-28 MED ORDER — CARVEDILOL 12.5 MG PO TABS: 12.5000 mg | ORAL_TABLET | Freq: Two times a day (BID) | ORAL | Status: DC

## 2015-10-28 MED ORDER — CARVEDILOL 6.25 MG PO TABS: 6.2500 mg | ORAL_TABLET | Freq: Two times a day (BID) | ORAL | Status: DC

## 2015-10-28 NOTE — Assessment & Plan Note (Signed)
History of hyperlipidemia with recent lipid profile performed 08/01/15 revealing a total cholesterol 247, LDL 177 and HDL of 47. We will recheck a lipid and liver profile. He may need to go on a statin drug.

## 2015-10-28 NOTE — Telephone Encounter (Signed)
New Message  Pt dtr requested to speak w/ RN concerning todfay's OV w/ Dr Gwenlyn Found. Please call back and discuss.

## 2015-10-28 NOTE — Assessment & Plan Note (Signed)
History of hypertension blood pressure measured at 116/88. He is on amlodipine and valsartan. He does have nonischemic myopathy was on carvedilol the past. I am unclear why that was discontinued. Hemostasis somewhat uncomfortable with him being on amlodipine with a reduced ejection fraction. I'm going to discontinue the amlodipine and reinitiate carvedilol 12.5 mg by mouth twice a day. He will see Alexander Higgins back in several weeks for titration up to 25 mg by mouth twice a day, his previous dose.

## 2015-10-28 NOTE — Assessment & Plan Note (Addendum)
History of chronic systolic heart failure with an EF in the 30- 35% range. He did have an ICD placed by Dr. Sallyanne Kuster primary prevention 04/23/15 with follow-up for wound care 06/06/15. He is aware of some restriction. He works out frequently and is minimally symptomatic. For some reason his carvedilol was discontinued and he was begun on amlodipine. I'm going to start him back on beta blocker and discontinue his calcium channel blocker. Apparently, he had symptomatic sinus bradycardia. We'll therefore restart his carvedilol back at a lower dose (6.25 mg by mouth twice a day) and follow his heart rate and blood pressure.

## 2015-10-28 NOTE — Telephone Encounter (Signed)
Called and spoke back to daughter, ok per DPR. She wanted to make sure her father's appt with Dr Gwenlyn Found went ok this morning. I told her about the medication changes and follow up appt to be scheduled. She was concerned about pt's current weight today.  She verbalized understanding saying she just wanted to make sure her daddy was doing ok.

## 2015-10-28 NOTE — Patient Instructions (Addendum)
Medication Instructions:  Your physician has recommended you make the following change in your medication:  1- STOP TAKING YOUR AMLODIPINE. 2- START TAKING carvedilol 6.25 mg (1 tablet) by mouth two times a day.   Labwork: Your physician recommends that you return for lab work AT [facility]. The lab can be found on the FIRST FLOOR of out building in [address]   Testing/Procedures: NONE  Follow-Up: We request that you follow-up in: 3 MONTHS with an extender and in 12 MONTHS with Dr Andria Rhein will receive a reminder letter in the mail two months in advance. If you don't receive a letter, please call our office to schedule the follow-up appointment.  DR BERRY WANTS YOU TO SEE OUR PHARMACIST IN 2-3 WEEKS FOR BP CHECK AND MEDICATION MANAGEMENT AND TITRATION.  Any Other Special Instructions Will Be Listed Below (If Applicable).     If you need a refill on your cardiac medications before your next appointment, please call your pharmacy.

## 2015-10-28 NOTE — Telephone Encounter (Signed)
Marlowe Kays called in stating that the pt has 2 different strengths for Carvedilol and she would like to know which one needs to be filled. Please f/u with her.   Thanks

## 2015-10-28 NOTE — Progress Notes (Signed)
10/28/2015 Alexander Higgins   01-Jul-1945  [id number]  Primary Physician Ann Held, DO Primary Cardiologist: Lorretta Harp MD Renae Gloss   HPI:  Alexander Higgins is a very pleasant 70-year-old moderately overweight divorced African American male father of 19, grandfather to 50 grandchildren who is currently retired but worked at [employer] previously. He was referred by Dr. Etter Sjogren for cardiovascular evaluation because of LV dysfunction and symptoms of congestive heart failure. I last saw him in the office 04/08/15. He has a history of hypertension. He never had a heart attack or stroke. He was seen at [facility] month ago with lower extremity edema, shortness of breath and abdominal bloating. He was sent home on furosemide. A recent 2-D echo performed on 12/04/14 revealed an EF of 20-25% with mild LV dilatation, no regional wall motion of the modalities, mild to moderate MR. I adjusted his medications and added carvedilol. A Myoview stress test showed diaphragmatic attenuation without ischemia or scar. His symptoms have markedly improved. I referred him to Dr. Sallyanne Kuster for consideration of ICD implantation for primary prevention which was performed successfully 04/23/15 with outpatient follow-up 06/06/15. He apparently also had some dramatic sinus bradycardia and his carvedilol was discontinued and amlodipine started. He does feel symptomatically improved although I am somewhat uncomfortable with him not being on any beta blocker.   Current Outpatient Prescriptions  Medication Sig Dispense Refill  . aspirin EC 81 MG tablet Take 81 mg by mouth daily.    . furosemide (LASIX) 40 MG tablet Take 1 tablet (40 mg total) by mouth 2 (two) times daily. 180 tablet 3  . tadalafil (CIALIS) 20 MG tablet Take 1 tablet (20 mg total) by mouth daily as needed for erectile dysfunction. 10 tablet 0  . valsartan (DIOVAN) 160 MG tablet TAKE ONE TABLET BY MOUTH ONCE DAILY 30 tablet 8   No  current facility-administered medications for this visit.    No Known Allergies  Social History   Social History  . Marital Status: Divorced    Spouse Name: N/A  . Number of Children: N/A  . Years of Education: N/A   Occupational History  . Not on file.   Social History Main Topics  . Smoking status: Never Smoker   . Smokeless tobacco: Never Used  . Alcohol Use: No  . Drug Use: No  . Sexual Activity: Yes   Other Topics Concern  . Not on file   Social History Narrative     Review of Systems: General: negative for chills, fever, night sweats or weight changes.  Cardiovascular: negative for chest pain, dyspnea on exertion, edema, orthopnea, palpitations, paroxysmal nocturnal dyspnea or shortness of breath Dermatological: negative for rash Respiratory: negative for cough or wheezing Urologic: negative for hematuria Abdominal: negative for nausea, vomiting, diarrhea, bright red blood per rectum, melena, or hematemesis Neurologic: negative for visual changes, syncope, or dizziness All other systems reviewed and are otherwise negative except as noted above.    Blood pressure 116/88, pulse 66, height 6' (1.829 m), weight 297 lb 8 oz (134.945 kg).  General appearance: alert and no distress Neck: no adenopathy, no carotid bruit, no JVD, supple, symmetrical, trachea midline and thyroid not enlarged, symmetric, no tenderness/mass/nodules Lungs: clear to auscultation bilaterally Heart: regular rate and rhythm, S1, S2 normal, no murmur, click, rub or gallop Extremities: trace bilateral lower extremity edema.  EKG sinus rhythm at 66 without ST or T-wave changes. I personally reviewed this EKG  ASSESSMENT AND PLAN:  Benign hypertension History of hypertension blood pressure measured at 116/88. He is on amlodipine and valsartan. He does have nonischemic myopathy was on carvedilol the past. I am unclear why that was discontinued. Hemostasis somewhat uncomfortable with him being on  amlodipine with a reduced ejection fraction. I'm going to discontinue the amlodipine and reinitiate carvedilol 12.5 mg by mouth twice a day. He will see Erasmo Downer back in several weeks for titration up to 25 mg by mouth twice a day, his previous dose.  Chronic systolic heart failure (HCC) History of chronic systolic heart failure with an EF in the 30- 35% range. He did have an ICD placed by Dr. Sallyanne Kuster primary prevention 04/23/15 with follow-up for wound care 06/06/15. He is aware of some restriction. He works out frequently and is minimally symptomatic. For some reason his carvedilol was discontinued and he was begun on amlodipine. I'm going to start him back on beta blocker and discontinue his calcium channel blocker. Apparently, he had symptomatic sinus bradycardia. We'll therefore restart his carvedilol back at a lower dose (6.25 mg by mouth twice a day) and follow his heart rate and blood pressure.  Hyperlipidemia History of hyperlipidemia with recent lipid profile performed 08/01/15 revealing a total cholesterol 247, LDL 177 and HDL of 47. We will recheck a lipid and liver profile. He may need to go on a statin drug.      Lorretta Harp MD FACP,FACC,FAHA, [facility] 10/28/2015 9:07 AM

## 2015-11-05 ENCOUNTER — Other Ambulatory Visit (INDEPENDENT_AMBULATORY_CARE_PROVIDER_SITE_OTHER): Payer: Medicare Other

## 2015-11-05 DIAGNOSIS — I10 Essential (primary) hypertension: Secondary | ICD-10-CM | POA: Diagnosis not present

## 2015-11-05 DIAGNOSIS — E7800 Pure hypercholesterolemia, unspecified: Secondary | ICD-10-CM | POA: Diagnosis not present

## 2015-11-05 LAB — COMPREHENSIVE METABOLIC PANEL
ALBUMIN: 4.2 g/dL (ref 3.5–5.2)
ALT: 26 U/L (ref 0–53)
AST: 30 U/L (ref 0–37)
Alkaline Phosphatase: 49 U/L (ref 39–117)
BUN: 24 mg/dL — AB (ref 6–23)
CHLORIDE: 104 meq/L (ref 96–112)
CO2: 28 mEq/L (ref 19–32)
CREATININE: 1.27 mg/dL (ref 0.40–1.50)
Calcium: 9.2 mg/dL (ref 8.4–10.5)
GFR: 72.37 mL/min (ref >60.00)
GLUCOSE: 100 mg/dL — AB (ref 70–99)
POTASSIUM: 3.9 meq/L (ref 3.5–5.1)
SODIUM: 138 meq/L (ref 135–145)
TOTAL PROTEIN: 7.7 g/dL (ref 6.0–8.3)
Total Bilirubin: 0.6 mg/dL (ref 0.2–1.2)

## 2015-11-05 LAB — LIPID PANEL
CHOLESTEROL: 220 mg/dL — AB (ref 0–200)
HDL: 36.7 mg/dL — ABNORMAL LOW (ref >39.00)
LDL Cholesterol: 159 mg/dL — ABNORMAL HIGH (ref 0–99)
NonHDL: 183.73
Total CHOL/HDL Ratio: 6
Triglycerides: 126 mg/dL (ref 0.0–149.0)
VLDL: 25.2 mg/dL (ref 0.0–40.0)

## 2015-11-12 ENCOUNTER — Other Ambulatory Visit: Payer: Self-pay | Admitting: Family Medicine

## 2015-11-12 DIAGNOSIS — E785 Hyperlipidemia, unspecified: Secondary | ICD-10-CM

## 2015-11-12 MED ORDER — PRAVASTATIN SODIUM 20 MG PO TABS: 20.0000 mg | ORAL_TABLET | Freq: Every day | ORAL | Status: DC

## 2015-11-18 ENCOUNTER — Ambulatory Visit: Payer: Medicare Other

## 2015-12-15 ENCOUNTER — Encounter: Payer: Medicare Other | Admitting: *Deleted

## 2015-12-15 ENCOUNTER — Telehealth: Payer: Self-pay | Admitting: Cardiology

## 2015-12-15 NOTE — Telephone Encounter (Signed)
Spoke with pt and reminded pt of remote transmission that is due today. Pt verbalized understanding.   

## 2015-12-19 ENCOUNTER — Encounter: Payer: Self-pay | Admitting: Cardiology

## 2016-02-11 ENCOUNTER — Other Ambulatory Visit (INDEPENDENT_AMBULATORY_CARE_PROVIDER_SITE_OTHER): Payer: Medicare Other

## 2016-02-11 DIAGNOSIS — E785 Hyperlipidemia, unspecified: Secondary | ICD-10-CM | POA: Diagnosis not present

## 2016-02-11 LAB — COMPREHENSIVE METABOLIC PANEL
ALBUMIN: 4 g/dL (ref 3.5–5.2)
ALT: 30 U/L (ref 0–53)
AST: 37 U/L (ref 0–37)
Alkaline Phosphatase: 50 U/L (ref 39–117)
BUN: 20 mg/dL (ref 6–23)
CALCIUM: 8.9 mg/dL (ref 8.4–10.5)
CHLORIDE: 102 meq/L (ref 96–112)
CO2: 31 mEq/L (ref 19–32)
Creatinine, Ser: 1.39 mg/dL (ref 0.40–1.50)
GFR: 65.16 mL/min (ref >60.00)
Glucose, Bld: 115 mg/dL — ABNORMAL HIGH (ref 70–99)
POTASSIUM: 3.8 meq/L (ref 3.5–5.1)
Sodium: 139 mEq/L (ref 135–145)
Total Bilirubin: 0.6 mg/dL (ref 0.2–1.2)
Total Protein: 7.7 g/dL (ref 6.0–8.3)

## 2016-02-11 LAB — LIPID PANEL
CHOLESTEROL: 178 mg/dL (ref 0–200)
HDL: 39.3 mg/dL (ref >39.00)
LDL CALC: 118 mg/dL — AB (ref 0–99)
NonHDL: 138.82
TRIGLYCERIDES: 103 mg/dL (ref 0.0–149.0)
Total CHOL/HDL Ratio: 5
VLDL: 20.6 mg/dL (ref 0.0–40.0)

## 2016-02-13 ENCOUNTER — Telehealth: Payer: Self-pay | Admitting: *Deleted

## 2016-02-13 NOTE — Telephone Encounter (Signed)
Patient returned call.  His Latitude monitor is flashing.  Spoke with [employer] rep and he will call patient back for assistance.  Patient is appreciative and denies additional questions or concerns at this time.

## 2016-02-13 NOTE — Telephone Encounter (Signed)
Spoke with patient regarding Latitude home monitor not connecting.  Patient discussed with [place], for further instructions.  He will plan to send manual transmission this weekend.  Patient is appreciative of call and denies additional questions or concerns at this time.

## 2016-02-20 NOTE — Telephone Encounter (Signed)
Please close Encounter °

## 2016-02-23 ENCOUNTER — Other Ambulatory Visit: Payer: Self-pay

## 2016-02-23 MED ORDER — PRAVASTATIN SODIUM 20 MG PO TABS: 20.0000 mg | ORAL_TABLET | Freq: Every day | ORAL | 2 refills | Status: DC

## 2016-04-10 ENCOUNTER — Other Ambulatory Visit: Payer: Self-pay | Admitting: Cardiovascular Disease

## 2016-05-04 ENCOUNTER — Encounter: Payer: Self-pay | Admitting: Cardiovascular Disease

## 2016-05-04 ENCOUNTER — Ambulatory Visit (INDEPENDENT_AMBULATORY_CARE_PROVIDER_SITE_OTHER): Payer: Medicare Other | Admitting: Cardiovascular Disease

## 2016-05-04 VITALS — BP 145/88 | HR 80 | Ht 72.0 in | Wt 298.0 lb

## 2016-05-04 DIAGNOSIS — Z95810 Presence of automatic (implantable) cardiac defibrillator: Secondary | ICD-10-CM

## 2016-05-04 DIAGNOSIS — I5022 Chronic systolic (congestive) heart failure: Secondary | ICD-10-CM | POA: Diagnosis not present

## 2016-05-04 LAB — CUP PACEART INCLINIC DEVICE CHECK
Battery Remaining Longevity: 126 mo
Battery Remaining Percentage: 100 %
Brady Statistic RA Percent Paced: 21 %
Brady Statistic RV Percent Paced: 1 %
Date Time Interrogation Session: 20171121145616
HIGH POWER IMPEDANCE MEASURED VALUE: 77 Ohm
Implantable Lead Implant Date: 20161109
Implantable Lead Location: 753860
Implantable Lead Serial Number: 710559
Lead Channel Pacing Threshold Amplitude: 0.8 V
Lead Channel Pacing Threshold Pulse Width: 0.4 ms
Lead Channel Setting Pacing Amplitude: 2.4 V
Lead Channel Setting Pacing Pulse Width: 0.4 ms
MDC IDC LEAD IMPLANT DT: 20161109
MDC IDC LEAD LOCATION: 753859
MDC IDC LEAD MODEL: 293
MDC IDC LEAD SERIAL: 385866
MDC IDC MSMT LEADCHNL RA IMPEDANCE VALUE: 794 Ohm
MDC IDC MSMT LEADCHNL RV IMPEDANCE VALUE: 524 Ohm
MDC IDC MSMT LEADCHNL RV PACING THRESHOLD AMPLITUDE: 0.8 V
MDC IDC MSMT LEADCHNL RV PACING THRESHOLD PULSEWIDTH: 0.4 ms
MDC IDC PG IMPLANT DT: 20161109
MDC IDC PG SERIAL: 512157
MDC IDC SET LEADCHNL RA PACING AMPLITUDE: 2.4 V
MDC IDC SET LEADCHNL RV SENSING SENSITIVITY: 0.6 mV

## 2016-05-04 MED ORDER — VALSARTAN 320 MG PO TABS: 320.0000 mg | ORAL_TABLET | Freq: Every day | ORAL | 11 refills | Status: DC

## 2016-05-04 NOTE — Patient Instructions (Signed)
Medication Instructions: Dr Sallyanne Kuster has recommended making the following medication changes: 1. INCREASE Valsartan to 320 mg daily  Labwork: NONE ORDERED  Testing/Procedures: 1. Echocardiogram - Your physician has requested that you have an echocardiogram. Echocardiography is a painless test that uses sound waves to create images of your heart. It provides your doctor with information about the size and shape of your heart and how well your heart's chambers and valves are working. This procedure takes approximately one hour. There are no restrictions for this procedure. This will be performed at our [place], [place] Transmission - Remote monitoring is used to monitor your Pacemaker of ICD from home. This monitoring reduces the number of office visits required to check your device to one time per year. It allows [place] to keep an eye on the functioning of your device to ensure it is working properly. You are scheduled for a device check from home on Tuesday, February 20th, 2018. You may send your transmission at any time that day. If you have a wireless device, the transmission will be sent automatically. After your physician reviews your transmission, you will receive a postcard with your next transmission date.   Follow-up: Dr Sallyanne Kuster recommends that you schedule a follow-up appointment in 12 months with a device check. You will receive a reminder letter in the mail two months in advance. If you don't receive a letter, please call our office to schedule the follow-up appointment.  If you need a refill on your cardiac medications before your next appointment, please call your pharmacy.

## 2016-05-04 NOTE — Progress Notes (Signed)
Patient ID: Alexander Higgins, male   DOB: 10-10-1945, 70 y.o.   MRN: [id number]     Cardiology Office Note    Date:  05/04/2016   ID:  Alexander Higgins, DOB 09-25-1945, MRN [id number]  PCP:  Ann Held, DO  Cardiologist:   Quay Burow, M.D.; Sanda Klein, MD   Chief complaint:  Follow-up after ICD implantation   History of Present Illness:  [name] is a 70 y.o. male , roughly 1 year status post implantation of a [employer] dual-chamber defibrillator for severe nonischemic cardiomyopathy with a left ventricular ejection fraction of approximately 30%  and symptomatic sinus bradycardia.  Mr. Heimbuch feels great. He is going regularly to the gym 5 days a week where he spends at least an hour and a half each day. After lifting weights he exercises on the bike or elliptical for another 30-50 minutes. He has lost weight, but remains morbidly obese. I think the PMI however overestimates his true degree of obesity, since he is extremely muscular. His blood pressure is slightly high today but when he checks it at home it is typically 120/70 mmHg.  Interrogation of his defibrillator shows normal function with an estimated generator longevity of 10.5 years. There have been no episodes of treated ventricular tachycardia or ventricular fibrillation. He has had 17 episodes of nonsustained ventricular tachycardia all of them very brief. A couple of episodes of ventricular tachycardia in the monitor zone appear to actually represent sinus tachycardia. There is clear difference in intracardiac electrogram morphology. He is 21% atrial pacing and only 1% ventricular pacing. Occasional episodes of paroxysmal atrial tachycardia have also been recorded.  The patient specifically denies any chest pain at rest or with exertion, dyspnea at rest or with exertion, orthopnea, paroxysmal nocturnal dyspnea, syncope, palpitations, focal neurological deficits, intermittent claudication, lower extremity  edema, unexplained weight gain, cough, hemoptysis or wheezing.  The patient also denies abdominal pain, nausea, vomiting, dysphagia, diarrhea, constipation, polyuria, polydipsia, dysuria, hematuria, frequency, urgency, abnormal bleeding or bruising, fever, chills, unexpected weight changes, mood swings, change in skin or hair texture, change in voice quality, auditory or visual problems, allergic reactions or rashes, new musculoskeletal complaints other than usual "aches and pains".     Past Medical History:  Diagnosis Date  . AICD (automatic cardioverter/defibrillator) present   . Arthritis    "right knee" (04/23/2015)  . CHF (congestive heart failure) ([place]) dx'd 11/2014  . Hyperlipidemia   . Hypertension   . LV dysfunction    systolic heart failure  . Nonischemic cardiomyopathy [facility])     Past Surgical History:  Procedure Laterality Date  . EP IMPLANTABLE DEVICE N/A 04/23/2015   Procedure: ICD Implant;  Surgeon: Sanda Klein, MD;  Location: [facility];  Service: Cardiovascular;  Laterality: N/A;  . INSERTION OF ICD  04/23/2015  . TONSILLECTOMY      Current Outpatient Prescriptions  Medication Sig Dispense Refill  . aspirin EC 81 MG tablet Take 81 mg by mouth daily.    . carvedilol (COREG) 6.25 MG tablet Take 1 tablet (6.25 mg total) by mouth 2 (two) times daily. 180 tablet 3  . furosemide (LASIX) 40 MG tablet Take 1 tablet (40 mg total) by mouth 2 (two) times daily. 180 tablet 1  . pravastatin (PRAVACHOL) 20 MG tablet Take 1 tablet (20 mg total) by mouth daily. 30 tablet 2  . valsartan (DIOVAN) 320 MG tablet Take 1 tablet (320 mg total) by mouth daily. 30 tablet 11   No  current facility-administered medications for this visit.     Allergies:   Patient has no known allergies.   Social History   Social History  . Marital status: Divorced    Spouse name: N/A  . Number of children: N/A  . Years of education: N/A   Social History Main Topics  . Smoking status:  Never Smoker  . Smokeless tobacco: Never Used  . Alcohol use No  . Drug use: No  . Sexual activity: Yes   Other Topics Concern  . None   Social History Narrative  . None     Family History:  The patient's family history includes Heart attack in his brother; Heart disease in his mother; Hypertension in his brother and mother; Stroke in his sister.   ROS:   Please see the history of present illness.    ROS All other systems reviewed and are negative.   PHYSICAL EXAM:   VS:  BP (!) 145/88 (BP Location: Left Arm, Patient Position: Sitting, Cuff Size: Large)   Pulse 80   Ht 6' (1.829 m)   Wt 298 lb (135.2 kg)   BMI 40.42 kg/m    GEN: Well nourished, well developed, in no acute distress  HEENT: normal  Neck: no JVD, carotid bruits, or masses Cardiac: RRR; no murmurs, rubs, or gallops,no edema ;  Healthy left subclavian ICD site Respiratory:  clear to auscultation bilaterally, normal work of breathing GI: soft, nontender, nondistended, + BS MS: no deformity or atrophy  Skin: warm and dry, no rash Neuro:  Alert and Oriented x 3, Strength and sensation are intact Psych: euthymic mood, full affect  Wt Readings from Last 3 Encounters:  05/04/16 298 lb (135.2 kg)  10/28/15 297 lb 8 oz (134.9 kg)  08/01/15 294 lb 9.6 oz (133.6 kg)      Studies/Labs Reviewed:   EKG:  EKG is ordered today. It shows atrial paced ventricular sensed rhythm with occasional premature atrial beats and at least one atrial couplet. QTC 491 ms  Recent Labs: 08/01/2015: Hemoglobin 15.7; Platelets 186.0 02/11/2016: ALT 30; BUN 20; Creatinine, Ser 1.39; Potassium 3.8; Sodium 139   Lipid Panel    Component Value Date/Time   CHOL 178 02/11/2016 0814   TRIG 103.0 02/11/2016 0814   HDL 39.30 02/11/2016 0814   CHOLHDL 5 02/11/2016 0814   VLDL 20.6 02/11/2016 0814   LDLCALC 118 (H) 02/11/2016 0814      ASSESSMENT:    1. Chronic systolic heart failure ([place])   2. ICD (implantable  cardioverter-defibrillator) in place      PLAN:  In order of problems listed above:  1.  CHF: Nonischemic cardiomyopathy. NYHA class 1, euvolemic without diuretic therapy. His blood pressure allows increase in valsartan and asked him to boost the dose to 320 mg today. He is curious about any changes in his left ventricular systolic function now that he is more physically active. I share his interest and will order an echocardiogram before his next appointment with Dr. Gwenlyn Found 2. ICD: Normally functioning dual-chamber defibrillator. Excellent lead parameters. Occasional brief episodes of nonsustained VT recorded, none requiring therapy. He doesn't seem to require the rate response sensor.   Medication Adjustments/Labs and Tests Ordered: Current medicines are reviewed at length with the patient today.  Concerns regarding medicines are outlined above.  Medication changes, Labs and Tests ordered today are listed below. Patient Instructions  Medication Instructions: Dr Sallyanne Kuster has recommended making the following medication changes: 1. INCREASE Valsartan to 320 mg daily  Labwork: NONE ORDERED  Testing/Procedures: 1. Echocardiogram - Your physician has requested that you have an echocardiogram. Echocardiography is a painless test that uses sound waves to create images of your heart. It provides your doctor with information about the size and shape of your heart and how well your heart's chambers and valves are working. This procedure takes approximately one hour. There are no restrictions for this procedure. This will be performed at our [place] [facility] - Remote monitoring is used to monitor your Pacemaker of ICD from home. This monitoring reduces the number of office visits required to check your device to one time per year. It allows [place] to keep an eye on the functioning of your device to ensure it is working properly. You are  scheduled for a device check from home on Tuesday, February 20th, 2018. You may send your transmission at any time that day. If you have a wireless device, the transmission will be sent automatically. After your physician reviews your transmission, you will receive a postcard with your next transmission date.   Follow-up: Dr Sallyanne Kuster recommends that you schedule a follow-up appointment in 12 months with a device check. You will receive a reminder letter in the mail two months in advance. If you don't receive a letter, please call our office to schedule the follow-up appointment.  If you need a refill on your cardiac medications before your next appointment, please call your pharmacy.     Signed, Sanda Klein, MD  05/04/2016 5:00 PM    [facility] [address], [place], [place]  [address] Phone: [phone]; Fax: [phone]

## 2016-05-27 ENCOUNTER — Other Ambulatory Visit: Payer: Self-pay | Admitting: Family Medicine

## 2016-05-31 ENCOUNTER — Ambulatory Visit (HOSPITAL_COMMUNITY): Payer: Medicare Other | Attending: Internal Medicine

## 2016-05-31 ENCOUNTER — Other Ambulatory Visit: Payer: Self-pay

## 2016-05-31 DIAGNOSIS — I5022 Chronic systolic (congestive) heart failure: Secondary | ICD-10-CM

## 2016-06-17 ENCOUNTER — Telehealth: Payer: Self-pay | Admitting: Family Medicine

## 2016-06-17 NOTE — Telephone Encounter (Signed)
lvm advising patient to schedule medicare wellness appointment.  °

## 2016-06-23 ENCOUNTER — Encounter: Payer: Medicare Other | Admitting: Cardiovascular Disease

## 2016-06-25 ENCOUNTER — Encounter: Payer: Self-pay | Admitting: Student

## 2016-06-25 ENCOUNTER — Ambulatory Visit (INDEPENDENT_AMBULATORY_CARE_PROVIDER_SITE_OTHER): Payer: Medicare Other | Admitting: Student

## 2016-06-25 VITALS — BP 146/84 | HR 76 | Ht 72.0 in | Wt 292.6 lb

## 2016-06-25 DIAGNOSIS — I428 Other cardiomyopathies: Secondary | ICD-10-CM | POA: Diagnosis not present

## 2016-06-25 DIAGNOSIS — I10 Essential (primary) hypertension: Secondary | ICD-10-CM | POA: Diagnosis not present

## 2016-06-25 DIAGNOSIS — E785 Hyperlipidemia, unspecified: Secondary | ICD-10-CM | POA: Diagnosis not present

## 2016-06-25 DIAGNOSIS — I5042 Chronic combined systolic (congestive) and diastolic (congestive) heart failure: Secondary | ICD-10-CM

## 2016-06-25 MED ORDER — VALSARTAN 320 MG PO TABS: 320.0000 mg | ORAL_TABLET | Freq: Every day | ORAL | 3 refills | Status: DC

## 2016-06-25 MED ORDER — FUROSEMIDE 40 MG PO TABS: 40.0000 mg | ORAL_TABLET | Freq: Two times a day (BID) | ORAL | 3 refills | Status: DC

## 2016-06-25 MED ORDER — CARVEDILOL 6.25 MG PO TABS: 9.3750 mg | ORAL_TABLET | Freq: Two times a day (BID) | ORAL | 3 refills | Status: DC

## 2016-06-25 NOTE — Progress Notes (Signed)
Cardiology Office Note    Date:  06/25/2016   ID:  Alexander Higgins, DOB 09-07-1945, MRN [id number]  PCP:  Ann Held, DO  Cardiologist:  Dr. Gwenlyn Found Primary Electrophysiologist: Dr. Sallyanne Kuster  Chief Complaint  Patient presents with  . Follow-up    History of Present Illness:    Alexander Higgins is a 71 y.o. male with past medical history of nonischemic cardiomyopathy (s/p ICD placement in 04/2015), chronic combined systolic and diastolic CHF (EF 99991111 by echo in 05/2016), HTN, and HLD who presents to the office today for annual follow-up.  Last seen by Dr. Gwenlyn Found in 10/2015 and was overall doing well from a cardiac perspective. He was switched from Amlodipine to Coreg in the setting of LV dysfunction. Seen by Dr. Sallyanne Kuster in 04/2016 with his device being interrogated which showed 17 episodes of NSVT, all which were brief in duration. Valsartan was increased from 160mg  daily to 320mg  daily to assist with BP.   In talking with the patient today, he reports doing well since his prior office visit. He has been exercising 4-5 times per week, which consists of aerobic activities and weight lifting. He denies any recent episodes of chest discomfort, palpitations, dyspnea with exertion, orthopnea, or lower extremity edema.  He reports good compliance with his medication regimen which includes ASA 81mg  daily, Coreg 6.25 BID, Lasix 40mg  BID, Pravachol 20mg  daily, and Valsartan 320mg  daily. He reports tolerating the increased Valsartan dosing well. Denies any fatigue, lightheadedness, or dizziness. He does check his blood pressure at home on occasion and says blood pressure ranges from the 120s to 140s/ 80s to 90s.   Past Medical History:  Diagnosis Date  . AICD (automatic cardioverter/defibrillator) present   . Arthritis    "right knee" (04/23/2015)  . Chronic combined systolic and diastolic CHF (congestive heart failure) ([place]) dx'd 11/2014   a. 05/2016: echo showing EF of 30-35%, Grade 1  DD, trivial MR, mild TR, PA peak pressure 17 mm Hg.   [place] Hyperlipidemia   . Hypertension   . Nonischemic cardiomyopathy ([place])    a. s/p ICD placement in 04/2015    Past Surgical History:  Procedure Laterality Date  . EP IMPLANTABLE DEVICE N/A 04/23/2015   Procedure: ICD Implant;  Surgeon: Sanda Klein, MD;  Location: [facility];  Service: Cardiovascular;  Laterality: N/A;  . INSERTION OF ICD  04/23/2015  . TONSILLECTOMY      Current Medications: Outpatient Medications Prior to Visit  Medication Sig Dispense Refill  . aspirin EC 81 MG tablet Take 81 mg by mouth daily.    . pravastatin (PRAVACHOL) 20 MG tablet TAKE ONE TABLET BY MOUTH ONCE DAILY 90 tablet 1  . carvedilol (COREG) 6.25 MG tablet Take 1 tablet (6.25 mg total) by mouth 2 (two) times daily. 180 tablet 3  . furosemide (LASIX) 40 MG tablet Take 1 tablet (40 mg total) by mouth 2 (two) times daily. 180 tablet 1  . valsartan (DIOVAN) 320 MG tablet Take 1 tablet (320 mg total) by mouth daily. 30 tablet 11   No facility-administered medications prior to visit.      Allergies:   Patient has no known allergies.   Social History   Social History  . Marital status: Divorced    Spouse name: N/A  . Number of children: N/A  . Years of education: N/A   Social History Main Topics  . Smoking status: Never Smoker  . Smokeless tobacco: Never Used  . Alcohol use No  .  Drug use: No  . Sexual activity: Yes   Other Topics Concern  . Not on file   Social History Narrative  . No narrative on file     Family History:  The patient's family history includes Heart attack in his brother; Heart disease in his mother; Hypertension in his brother and mother; Stroke in his sister.   Review of Systems:   Please see the history of present illness.     Denies any of the below ROS.   General:  No chills, fever, night sweats or weight changes.  Cardiovascular:  No chest pain, dyspnea on exertion, edema, orthopnea, palpitations,  paroxysmal nocturnal dyspnea. Dermatological: No rash, lesions/masses Respiratory: No cough, dyspnea Urologic: No hematuria, dysuria Abdominal:   No nausea, vomiting, diarrhea, bright red blood per rectum, melena, or hematemesis Neurologic:  No visual changes, wkns, changes in mental status. All other systems reviewed and are otherwise negative except as noted above.   Physical Exam:    VS:  BP (!) 146/84   Pulse 76   Ht 6' (1.829 m)   Wt 292 lb 9.6 oz (132.7 kg)   BMI 39.68 kg/m    General: Well developed, well nourished [place] male appearing in no acute distress. Head: Normocephalic, atraumatic, sclera non-icteric, no xanthomas, nares are without discharge.  Neck: No carotid bruits. JVD difficult to assess secondary to body habitus.  Lungs: Respirations regular and unlabored, without wheezes or rales.  Heart: Regular rate and rhythm. No S3 or S4.  No murmur, no rubs, or gallops appreciated. ICD noted.  Abdomen: Soft, non-tender, non-distended with normoactive bowel sounds. No hepatomegaly. No rebound/guarding. No obvious abdominal masses. Msk:  Strength and tone appear normal for age. No joint deformities or effusions. Extremities: No clubbing or cyanosis. No edema.  Distal pedal pulses are 2+ bilaterally. Neuro: Alert and oriented X 3. Moves all extremities spontaneously. No focal deficits noted. Psych:  Responds to questions appropriately with a normal affect. Skin: No rashes or lesions noted  Wt Readings from Last 3 Encounters:  06/25/16 292 lb 9.6 oz (132.7 kg)  05/04/16 298 lb (135.2 kg)  10/28/15 297 lb 8 oz (134.9 kg)     Studies/Labs Reviewed:   EKG:  EKG is not ordered today.    Recent Labs: 08/01/2015: Hemoglobin 15.7; Platelets 186.0 02/11/2016: ALT 30; BUN 20; Creatinine, Ser 1.39; Potassium 3.8; Sodium 139   Lipid Panel    Component Value Date/Time   CHOL 178 02/11/2016 0814   TRIG 103.0 02/11/2016 0814   HDL 39.30 02/11/2016 0814   CHOLHDL 5  02/11/2016 0814   VLDL 20.6 02/11/2016 0814   LDLCALC 118 (H) 02/11/2016 0814    Additional studies/ records that were reviewed today include:   Echocardiogram: 05/2016 Study Conclusions  - Left ventricle: The cavity size was normal. There was moderate   concentric hypertrophy. Systolic function was moderately to   severely reduced. The estimated ejection fraction was in the   range of 30% to 35%. Diffuse hypokinesis. Doppler parameters are   consistent with abnormal left ventricular relaxation (grade 1   diastolic dysfunction). The E/e&' ratio is between 8-15,   suggesting indeterminate LV filling pressure. - Mitral valve: Mildly thickened leaflets . There was trivial   regurgitation. - Left atrium: The atrium was normal in size. - Right ventricle: The cavity size was mildly dilated. Pacer wire   or catheter noted in right ventricle. Systolic function was   normal. - Right atrium: The atrium was mildly dilated.  Pacer wire or   catheter noted in right atrium. - Tricuspid valve: There was mild regurgitation. - Pulmonary arteries: Dilated. PA peak pressure: 17 mm Hg (S). - Inferior vena cava: The vessel was normal in size. The   respirophasic diameter changes were in the normal range (= 50%),   consistent with normal central venous pressure. - Pericardium, extracardiac: There was no pericardial effusion.  Impressions:  - Compared to prior study in 2016, the LVEF is unchanged at 30-35%.   Assessment:    1. Chronic combined systolic and diastolic CHF (congestive heart failure) ([place])   2. Non-ischemic cardiomyopathy ([place])   3. Essential hypertension   4. Hyperlipidemia, unspecified hyperlipidemia type      Plan:   In order of problems listed above:  1. Chronic Combined Systolic and Diastolic CHF - EF 99991111 by echo in 05/2016 (similar to prior imaging). - he reports doing well from a cardiac perspective. Denies any recent episodes of chest discomfort, palpitations,  dyspnea with exertion, orthopnea, PND, or lower extremity edema. - currently on Coreg 6.25 BID, Lasix 40mg  BID, and Valsartan 320mg  daily. Was unable to tolerate higher doses of BB in the past secondary to bradycardia. HR has sustained in the 70's -80's over prior visits. Will gradually increase Coreg to 9.375mg  BID. Can hopefully titrate to 12.5mg  BID if he tolerates the adjustment well.   2. Nonischemic Cardiomyopathy - s/p ICD placement in 04/2015 - normal functioning by device check in 04/2016. - followed by Dr. Sallyanne Kuster.   3. HTN - BP at 146/84 today. Rechecked and 148/92. - continue Valsartan 320mg  daily. Increase Coreg as above.   4. HLD - Lipid Panel in 01/2016 showed total cholesterol 178, HDL 39, and LDL 118. - currently on Pravastatin 40mg  daily. Followed by PCP.    Medication Adjustments/Labs and Tests Ordered: Current medicines are reviewed at length with the patient today.  Concerns regarding medicines are outlined above.  Medication changes, Labs and Tests ordered today are listed in the Patient Instructions below. Patient Instructions  Medication Instructions:  INCREASE COREG TO 1 AND 1/2 TABLETS TWICE DAILY (9.375MG )   If you need a refill on your cardiac medications before your next appointment, please call your pharmacy.   Follow-Up: Your physician wants you to follow-up in: [place] will receive a reminder letter in the mail two months in advance. If you don't receive a letter, please call our office IN OCTOBER to schedule the follow-up appointment.  Arna Medici, [place]  06/25/2016 5:32 PM    [address], [place], [place]  [address] Phone: [phone]; Fax: [phone]  [address], [place],  [address] Phone: [phone]

## 2016-06-25 NOTE — Patient Instructions (Addendum)
Medication Instructions:  INCREASE COREG TO 1 AND 1/2 TABLETS TWICE DAILY (9.375MG )   If you need a refill on your cardiac medications before your next appointment, please call your pharmacy.   Follow-Up: Your physician wants you to follow-up in: [place] will receive a reminder letter in the mail two months in advance. If you don't receive a letter, please call our office IN OCTOBER to schedule the follow-up appointment.    Thank you for choosing [facility] at [employer], LPN Bernerd Pho, PA-C

## 2016-07-13 ENCOUNTER — Telehealth: Payer: Self-pay | Admitting: Family Medicine

## 2016-07-13 NOTE — Telephone Encounter (Signed)
Called patient to scheduled awv. Left msg for patient to call office to schedule appt.

## 2016-07-26 IMAGING — NM NM MISC PROCEDURE
4 series · 24 of 24 positions shown · non-contrast
Comparison: none

[Series 1: wbr_s-proj_st wbr stress ng · 6.40mm/px · 6 of 64 frames shown]
[frame 6/64]
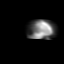
[frame 16/64]
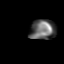
[frame 27/64]
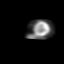
[frame 38/64]
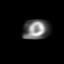
[frame 48/64]
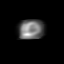
[frame 59/64]
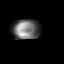

[Series 1: wbr stress ng · 6.40mm/px · 6 of 64 frames shown]
[frame 6/64]
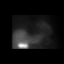
[frame 16/64]
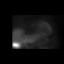
[frame 27/64]
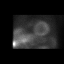
[frame 38/64]
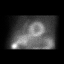
[frame 48/64]
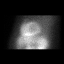
[frame 59/64]
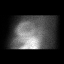

[Series 2: wbr_r-proj_st wbr rest · 6.40mm/px · 6 of 64 frames shown]
[frame 6/64]
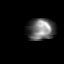
[frame 16/64]
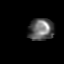
[frame 27/64]
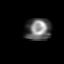
[frame 38/64]
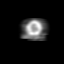
[frame 48/64]
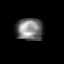
[frame 59/64]
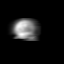

[Series 2: wbr rest · 6.40mm/px · 6 of 64 frames shown]
[frame 6/64]
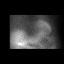
[frame 16/64]
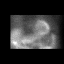
[frame 27/64]
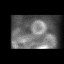
[frame 38/64]
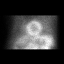
[frame 48/64]
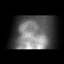
[frame 59/64]
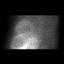

[24 of 24 positions shown; findings below may reference images not displayed]

Canned report from images found in remote index.

Refer to host system for actual result text.

## 2016-08-03 ENCOUNTER — Ambulatory Visit (INDEPENDENT_AMBULATORY_CARE_PROVIDER_SITE_OTHER): Payer: Medicare Other | Admitting: *Deleted

## 2016-08-03 DIAGNOSIS — I428 Other cardiomyopathies: Secondary | ICD-10-CM

## 2016-08-03 NOTE — Progress Notes (Signed)
Remote ICD transmission.   

## 2016-08-04 ENCOUNTER — Encounter: Payer: Self-pay | Admitting: Cardiology

## 2016-08-04 LAB — CUP PACEART REMOTE DEVICE CHECK
Brady Statistic RA Percent Paced: 20 %
Date Time Interrogation Session: 20180220125600
HighPow Impedance: 79 Ohm
Implantable Lead Implant Date: 20161109
Implantable Lead Location: 753859
Implantable Lead Serial Number: 385866
Lead Channel Impedance Value: 503 Ohm
Lead Channel Impedance Value: 765 Ohm
Lead Channel Pacing Threshold Amplitude: 0.8 V
Lead Channel Pacing Threshold Pulse Width: 0.4 ms
Lead Channel Setting Pacing Amplitude: 2.4 V
Lead Channel Setting Sensing Sensitivity: 0.6 mV
MDC IDC LEAD IMPLANT DT: 20161109
MDC IDC LEAD LOCATION: 753860
MDC IDC LEAD SERIAL: 710559
MDC IDC MSMT BATTERY REMAINING LONGEVITY: 126 mo
MDC IDC MSMT BATTERY REMAINING PERCENTAGE: 100 %
MDC IDC MSMT LEADCHNL RA PACING THRESHOLD AMPLITUDE: 0.8 V
MDC IDC MSMT LEADCHNL RA PACING THRESHOLD PULSEWIDTH: 0.4 ms
MDC IDC PG IMPLANT DT: 20161109
MDC IDC SET LEADCHNL RV PACING AMPLITUDE: 2.4 V
MDC IDC SET LEADCHNL RV PACING PULSEWIDTH: 0.4 ms
MDC IDC STAT BRADY RV PERCENT PACED: 1 %
Pulse Gen Serial Number: 512157

## 2016-08-12 NOTE — Progress Notes (Signed)
Cardiology Office Note    Date:  08/13/2016   ID:  Alexander Higgins, DOB 04-Mar-1946, MRN [id number]  PCP:  Ann Held, DO  Cardiologist:  Dr. Gwenlyn Found Electrophysiologist: Dr. Sallyanne Kuster  Chief Complaint  Patient presents with  . Atrial Fibrillation    History of Present Illness:    Alexander Higgins is a 71 y.o. male with past medical history of nonischemic cardiomyopathy (s/p ICD placement in 04/2015), chronic combined systolic and diastolic CHF (EF 45-80% by echo in 05/2016), HTN, and HLD who presents to the office today for follow-up.   Was seen by myself on 06/25/2016 and dong well from a cardiac perspective at that time.Was continued on Lasix 35m BID, Valsartan 3244mdaily, and Coreg was increased to 9.37550mID.   Since his appointment, he underwent a remote device check which showed a few episodes of sustained paroxysmal atrial fibrillation (total approximately 10 hours) with average ventricular rates in 130s. It was therefore recommended he come in for a visit to discuss anticoagulation and improved rate control.   In talking with the patient today, he denies any recent chest discomfort, palpitations, dyspnea with exertion, lightheadedness, presyncope, or syncope.  He denies any known history of prior atrial fibrillation. When asked about any new changes to his regimen, he does report starting black seed oil approximately 3 months prior for regulation of his GI tract.   His Carvedilol was increased at his last office visit and he reports tolerating this well. He goes to the gym several times a week and performs aerobic activities and weight lifting, denying any associated dizziness or fatigue with this.   Past Medical History:  Diagnosis Date  . AICD (automatic cardioverter/defibrillator) present   . Arthritis    "right knee" (04/23/2015)  . Chronic combined systolic and diastolic CHF (congestive heart failure) ([place] 11/2014   a. 05/2016: echo showing EF of 30-35%,  Grade 1 DD, trivial MR, mild TR, PA peak pressure 17 mm Hg.   . HMarland Kitchenperlipidemia   . Hypertension   . Nonischemic cardiomyopathy ([facility]  a. s/p ICD placement in 04/2015    Past Surgical History:  Procedure Laterality Date  . EP IMPLANTABLE DEVICE N/A 04/23/2015   Procedure: ICD Implant;  Surgeon: MihSanda KleinD;  Location: [facility];  Service: Cardiovascular;  Laterality: N/A;  . INSERTION OF ICD  04/23/2015  . TONSILLECTOMY      Current Medications: Outpatient Medications Prior to Visit  Medication Sig Dispense Refill  . furosemide (LASIX) 40 MG tablet Take 1 tablet (40 mg total) by mouth 2 (two) times daily. 180 tablet 3  . pravastatin (PRAVACHOL) 20 MG tablet TAKE ONE TABLET BY MOUTH ONCE DAILY 90 tablet 1  . valsartan (DIOVAN) 320 MG tablet Take 1 tablet (320 mg total) by mouth daily. 90 tablet 3  . aspirin EC 81 MG tablet Take 81 mg by mouth daily.    . carvedilol (COREG) 6.25 MG tablet Take 1.5 tablets (9.375 mg total) by mouth 2 (two) times daily. 135 tablet 3   No facility-administered medications prior to visit.      Allergies:   Patient has no known allergies.   Social History   Social History  . Marital status: Divorced    Spouse name: N/A  . Number of children: N/A  . Years of education: N/A   Social History Main Topics  . Smoking status: Never Smoker  . Smokeless tobacco: Never Used  . Alcohol use No  .  Drug use: No  . Sexual activity: Yes   Other Topics Concern  . Not on file   Social History Narrative  . No narrative on file     Family History:  The patient's family history includes Heart attack in his brother; Heart disease in his mother; Hypertension in his brother and mother; Stroke in his sister.   Review of Systems:   Please see the history of present illness.     General:  No chills, fever, night sweats or weight changes.  Cardiovascular:  No chest pain, dyspnea on exertion, edema, orthopnea, palpitations, paroxysmal nocturnal  dyspnea. Dermatological: No rash, lesions/masses Respiratory: No cough, Positive for dyspnea Urologic: No hematuria, dysuria Abdominal:   No nausea, vomiting, diarrhea, bright red blood per rectum, melena, or hematemesis Neurologic:  No visual changes, wkns, changes in mental status. All other systems reviewed and are otherwise negative except as noted above.   Physical Exam:    VS:  BP 124/76   Pulse 63   Ht 6' (1.829 m)   Wt 286 lb 9.6 oz (130 kg)   BMI 38.87 kg/m    General: Well developed, well nourished [place] American male appearing in no acute distress. Head: Normocephalic, atraumatic, sclera non-icteric, no xanthomas, nares are without discharge.  Neck: No carotid bruits. JVD not elevated.  Lungs: Respirations regular and unlabored, without wheezes or rales.  Heart: Regular rate and rhythm, with occasional ectopic beats. No S3 or S4.  No murmur, no rubs, or gallops appreciated. Abdomen: Soft, non-tender, non-distended with normoactive bowel sounds. No hepatomegaly. No rebound/guarding. No obvious abdominal masses. Msk:  Strength and tone appear normal for age. No joint deformities or effusions. Extremities: No clubbing or cyanosis. No edema.  Distal pedal pulses are 2+ bilaterally. Neuro: Alert and oriented X 3. Moves all extremities spontaneously. No focal deficits noted. Psych:  Responds to questions appropriately with a normal affect. Skin: No rashes or lesions noted  Wt Readings from Last 3 Encounters:  08/13/16 286 lb 9.6 oz (130 kg)  06/25/16 292 lb 9.6 oz (132.7 kg)  05/04/16 298 lb (135.2 kg)     Studies/Labs Reviewed:   EKG:  EKG is ordered today.  The ekg ordered today demonstrates sinus rhythm, HR 97, with PAC's.   Recent Labs: 02/11/2016: ALT 30; BUN 20; Creatinine, Ser 1.39; Potassium 3.8; Sodium 139   Lipid Panel    Component Value Date/Time   CHOL 178 02/11/2016 0814   TRIG 103.0 02/11/2016 0814   HDL 39.30 02/11/2016 0814   CHOLHDL 5 02/11/2016  0814   VLDL 20.6 02/11/2016 0814   LDLCALC 118 (H) 02/11/2016 0814    Additional studies/ records that were reviewed today include:   Echocardiogram: 05/2016 Study Conclusions  - Left ventricle: The cavity size was normal. There was moderate   concentric hypertrophy. Systolic function was moderately to   severely reduced. The estimated ejection fraction was in the   range of 30% to 35%. Diffuse hypokinesis. Doppler parameters are   consistent with abnormal left ventricular relaxation (grade 1   diastolic dysfunction). The E/e&' ratio is between 8-15,   suggesting indeterminate LV filling pressure. - Mitral valve: Mildly thickened leaflets . There was trivial   regurgitation. - Left atrium: The atrium was normal in size. - Right ventricle: The cavity size was mildly dilated. Pacer wire   or catheter noted in right ventricle. Systolic function was   normal. - Right atrium: The atrium was mildly dilated. Pacer wire or   catheter  noted in right atrium. - Tricuspid valve: There was mild regurgitation. - Pulmonary arteries: Dilated. PA peak pressure: 17 mm Hg (S). - Inferior vena cava: The vessel was normal in size. The   respirophasic diameter changes were in the normal range (= 50%),   consistent with normal central venous pressure. - Pericardium, extracardiac: There was no pericardial effusion.  Impressions:  - Compared to prior study in 2016, the LVEF is unchanged at 30-35%.  Assessment:    1. New onset atrial fibrillation ([place])   2. Non-ischemic cardiomyopathy ([place])   3. ICD (implantable cardioverter-defibrillator) in place   4. Chronic combined systolic and diastolic CHF (congestive heart failure) ([place])   5. Essential hypertension      Plan:   In order of problems listed above:  1. New Onset Atrial Fibrillation - recent remote device check showed episodes of sustained paroxysmal atrial fibrillation (total approximately 10 hours) with average ventricular rates in  130s. He is asymptomatic with this, denying any recent chest discomfort, palpitations, dyspnea with exertion, lightheadedness, presyncope, or syncope. - will check TSH, BMET, and Mg to assess for any significant abnormalities.  - This patients CHA2DS2-VASc Score and unadjusted Ischemic Stroke Rate (% per year) is equal to 3.2 % stroke rate/year from a score of 3 (CHF, HTN, Age). Reviewed pathophysiology of atrial fibrillation along with risks and benefits of anticoagulation. He is in agreement to proceed with initiation of Eliquis 5 mg BID. Will stop ASA due to concurrent need for anticoagulation.  - increase Coreg from 9.367m BID to 12.562mBID to assist with rate-control.   2. Nonischemic cardiomyopathy - s/p ICD placement in 04/2015.  - followed by Dr. CrSallyanne Kuster3. Chronic combined systolic and diastolic CHF - EF 3098-26%y echo in 05/2016 - he does not appear volume overloaded by physical examination. - continue Lasix, Valsartan and Coreg (with dose titration as above).  4. Essential HTN - BP well-controlled at 124/76 during today's visit. - Continue current medication regimen. - HTN, and HLD who presents to the office today for follow-up.    Medication Adjustments/Labs and Tests Ordered: Current medicines are reviewed at length with the patient today.  Concerns regarding medicines are outlined above.  Medication changes, Labs and Tests ordered today are listed in the Patient Instructions below. Patient Instructions  Medication Instructions:  1.) the carvedilol has been increased to 12.5 mg twice a day.  2.) start new Eliquis 5 mg prescription.  3.) STOP Aspirin  Labwork: TSH, B-MET, MAGNESIUM today.  Testing/Procedures: NONE  Follow-Up: 4-6 weeks with Dr CrSallyanne Kuster Any Other Special Instructions Will Be Listed Below (If Applicable).   Signed, BrErma HeritagePA  08/13/2016 5:27 PM    [place]   [id number]: [phone]: [phone] [address] LaneSuOaklandrCottage City[place] [id number]: [phone]

## 2016-08-13 ENCOUNTER — Encounter: Payer: Self-pay | Admitting: Student

## 2016-08-13 ENCOUNTER — Ambulatory Visit (INDEPENDENT_AMBULATORY_CARE_PROVIDER_SITE_OTHER): Payer: Medicare Other | Admitting: Student

## 2016-08-13 VITALS — BP 124/76 | HR 63 | Ht 72.0 in | Wt 286.6 lb

## 2016-08-13 DIAGNOSIS — I4891 Unspecified atrial fibrillation: Secondary | ICD-10-CM | POA: Diagnosis not present

## 2016-08-13 DIAGNOSIS — I5042 Chronic combined systolic (congestive) and diastolic (congestive) heart failure: Secondary | ICD-10-CM | POA: Diagnosis not present

## 2016-08-13 DIAGNOSIS — I10 Essential (primary) hypertension: Secondary | ICD-10-CM | POA: Diagnosis not present

## 2016-08-13 DIAGNOSIS — I428 Other cardiomyopathies: Secondary | ICD-10-CM

## 2016-08-13 DIAGNOSIS — Z95810 Presence of automatic (implantable) cardiac defibrillator: Secondary | ICD-10-CM

## 2016-08-13 DIAGNOSIS — I480 Paroxysmal atrial fibrillation: Secondary | ICD-10-CM | POA: Diagnosis not present

## 2016-08-13 LAB — BASIC METABOLIC PANEL
BUN: 28 mg/dL — AB (ref 7–25)
CO2: 25 mmol/L (ref 20–31)
CREATININE: 1.3 mg/dL — AB (ref 0.70–1.25)
Calcium: 9 mg/dL (ref 8.6–10.3)
Chloride: 104 mmol/L (ref 98–110)
Glucose, Bld: 94 mg/dL (ref 65–99)
Potassium: 4.4 mmol/L (ref 3.5–5.3)
Sodium: 139 mmol/L (ref 135–146)

## 2016-08-13 LAB — TSH: TSH: 1.84 mIU/L (ref 0.40–4.50)

## 2016-08-13 MED ORDER — APIXABAN 5 MG PO TABS: 5.0000 mg | ORAL_TABLET | Freq: Two times a day (BID) | ORAL | 0 refills | Status: DC

## 2016-08-13 MED ORDER — CARVEDILOL 12.5 MG PO TABS: 12.5000 mg | ORAL_TABLET | Freq: Two times a day (BID) | ORAL | 3 refills | Status: DC

## 2016-08-13 NOTE — Progress Notes (Signed)
Thanks, B! C

## 2016-08-13 NOTE — Patient Instructions (Addendum)
Medication Instructions:  1.) the carvedilol has been increased to 12.5 mg twice a day.  2.) start new Eliquis 5 mg prescription.  3.) STOP Aspirin  Labwork: TSH, B-MET, MAGNESIUM today.   Testing/Procedures:  NONE  Follow-Up:  4-6 weeks with Dr Sallyanne Kuster.  Any Other Special Instructions Will Be Listed Below (If Applicable).

## 2016-08-14 LAB — MAGNESIUM: Magnesium: 2.1 mg/dL (ref 1.5–2.5)

## 2016-08-20 NOTE — Progress Notes (Signed)
Subjective:   Alexander Higgins is a 71 y.o. male who presents for an Initial Medicare Annual Wellness Visit.  The Patient was informed that the wellness visit is to identify future health risk and educate and initiate measures that can reduce risk for increased disease through the lifespan.   Describes health as fair, good or great? GREAT  Review of Systems  No ROS.  Medicare Wellness Visit.  Cardiac Risk Factors include: advanced age (>67men, >32 women);dyslipidemia;sedentary lifestyle Sleep patterns: Sleeps 8-9 hrs per night.   Home Safety/Smoke Alarms:  Feels safe in home. Smoke alarms in place.  Living environment; residence and Firearm Safety: Lives alone in 2 story.  Seat Belt Safety/Bike Helmet: Wears seat belt.   Counseling:   Eye Exam- Pt states not needed. Dental- [facility] as needed.  Male:   CCS- Last pt reported 06/14/14: pt reported polyp removed and to follow up in 5 yrs.   PSA-  Lab Results  Component Value Date   PSA 0.48 08/01/2015       Objective:    Today's Vitals   08/23/16 0939  BP: 108/70  Pulse: 66  Resp: 16  Temp: 97.7 F (36.5 C)  TempSrc: Oral  SpO2: 96%  Weight: 286 lb 3.2 oz (129.8 kg)  Height: 6' (1.829 m)   Body mass index is 38.82 kg/m.  Current Medications (verified) Outpatient Encounter Prescriptions as of 08/23/2016  Medication Sig  . apixaban (ELIQUIS) 5 MG TABS tablet Take 1 tablet (5 mg total) by mouth 2 (two) times daily.  . carvedilol (COREG) 12.5 MG tablet Take 1 tablet (12.5 mg total) by mouth 2 (two) times daily.  . furosemide (LASIX) 40 MG tablet Take 1 tablet (40 mg total) by mouth 2 (two) times daily.  . pravastatin (PRAVACHOL) 20 MG tablet TAKE ONE TABLET BY MOUTH ONCE DAILY  . valsartan (DIOVAN) 320 MG tablet Take 1 tablet (320 mg total) by mouth daily.  Marland Kitchen Zoster Vac Recomb Adjuvanted (SHINGRIX) 50 MCG SUSR Inject 50 mcg into the muscle once.   No facility-administered encounter medications on file as of  08/23/2016.     Allergies (verified) Patient has no known allergies.   History: Past Medical History:  Diagnosis Date  . AICD (automatic cardioverter/defibrillator) present   . Arthritis    "right knee" (04/23/2015)  . Atrial fibrillation ([place])    a. identified on device check in 07/2016.  Marland Kitchen Chronic combined systolic and diastolic CHF (congestive heart failure) ([place]) dx'd 11/2014   a. 05/2016: echo showing EF of 30-35%, Grade 1 DD, trivial MR, mild TR, PA peak pressure 17 mm Hg.   Marland Kitchen Hyperlipidemia   . Hypertension   . Nonischemic cardiomyopathy ([place])    a. s/p ICD placement in 04/2015   Past Surgical History:  Procedure Laterality Date  . EP IMPLANTABLE DEVICE N/A 04/23/2015   Procedure: ICD Implant;  Surgeon: Sanda Klein, MD;  Location: [facility];  Service: Cardiovascular;  Laterality: N/A;  . INSERTION OF ICD  04/23/2015  . TONSILLECTOMY     Family History  Problem Relation Age of Onset  . Hypertension Mother   . Heart disease Mother   . Hypertension Brother   . Heart attack Brother   . Stroke Sister    Social History   Occupational History  . Not on file.   Social History Main Topics  . Smoking status: Never Smoker  . Smokeless tobacco: Never Used  . Alcohol use No  . Drug use: No  .  Sexual activity: Yes   Tobacco Counseling Counseling given: Not Answered   Activities of Daily Living In your present state of health, do you have any difficulty performing the following activities: 08/23/2016 08/23/2016  Hearing? N N  Vision? N N  Difficulty concentrating or making decisions? N N  Walking or climbing stairs? N N  Dressing or bathing? N N  Doing errands, shopping? N N  Preparing Food and eating ? N -  Using the Toilet? N -  Do you have problems with loss of bowel control? N -  Managing your Medications? N -  Managing your Finances? N -  Housekeeping or managing your Housekeeping? N -  Some recent data might be hidden    Immunizations and Health  Maintenance Immunization History  Administered Date(s) Administered  . Influenza, High Dose Seasonal PF 07/03/2016  . Influenza,inj,Quad PF,36+ Mos 03/10/2015  . Pneumococcal Conjugate-13 08/01/2015  . Tdap 11/19/2014   Health Maintenance Due  Topic Date Due  . PNA vac Low Risk Adult (2 of 2 - PPSV23) 07/31/2016    Patient Care Team: Ann Held, DO as PCP - General (Family Medicine) Erma Heritage, [place] as Physician Assistant (Physician Assistant) Sanda Klein, MD as Consulting Physician (Cardiology)  Indicate any recent Medical Services you may have received from other than [facility] providers in the past year (date may be approximate).    Assessment:   This is a routine wellness examination for [employer]. Physical assessment deferred to PCP.   Hearing/Vision screen  Visual Acuity Screening   Right eye Left eye Both eyes  Without correction: 20/20 20/20 20/20   With correction:     Hearing Screening Comments: Able to hear conversational tones w/o difficulty. No issues reported.  Passed whisper test.   Dietary issues and exercise activities discussed: Current Exercise Habits: Structured exercise class, Type of exercise: strength training/weights, Frequency (Times/Week): 7, Intensity: Moderate   Diet (meal preparation, eat out, water intake, caffeinated beverages, dairy products, fruits and vegetables): in general, a "healthy" diet         Goals    . Begin to work again part time.      Depression Screen PHQ 2/9 Scores 08/23/2016 08/23/2016 08/01/2015 11/28/2014  PHQ - 2 Score 0 0 0 0    Fall Risk Fall Risk  08/23/2016 08/01/2015 11/28/2014  Falls in the past year? No No No    Cognitive Function: Ad8 score reviewed for issues:  Issues making decisions:no  Less interest in hobbies / activities:no  Repeats questions, stories (family complaining):no  Trouble using ordinary gadgets (microwave, computer, phone):no  Forgets the month or year: no  Mismanaging  finances: no  Remembering appts:no  Daily problems with thinking and/or memory:no Ad8 score is=0            Screening Tests Health Maintenance  Topic Date Due  . PNA vac Low Risk Adult (2 of 2 - PPSV23) 07/31/2016  . COLONOSCOPY  06/15/2019  . TETANUS/TDAP  11/18/2024  . INFLUENZA VACCINE  Completed  . Hepatitis C Screening  Completed        Plan:  Continue to eat heart healthy diet (full of fruits, vegetables, whole grains, lean protein, water--limit salt, fat, and sugar intake) and increase physical activity as tolerated.   During the course of the visit Demarr was educated and counseled about the following appropriate screening and preventive services:   Vaccines to include Pneumoccal, Influenza, Hepatitis B, Td, HCV  Colorectal cancer screening  Cardiovascular disease screening  Diabetes screening  Glaucoma screening  Nutrition counseling  Prostate cancer screening Patient Instructions (the written plan) were given to the patient.   Shela Nevin, [place]   08/23/2016

## 2016-08-20 NOTE — Progress Notes (Signed)
Pre visit review using our clinic review tool, if applicable. No additional management support is needed unless otherwise documented below in the visit note. 

## 2016-08-23 ENCOUNTER — Ambulatory Visit (INDEPENDENT_AMBULATORY_CARE_PROVIDER_SITE_OTHER): Payer: Medicare Other | Admitting: Family Medicine

## 2016-08-23 ENCOUNTER — Encounter: Payer: Self-pay | Admitting: Family Medicine

## 2016-08-23 VITALS — BP 108/70 | HR 66 | Temp 97.7°F | Resp 16 | Ht 72.0 in | Wt 286.2 lb

## 2016-08-23 DIAGNOSIS — I10 Essential (primary) hypertension: Secondary | ICD-10-CM

## 2016-08-23 DIAGNOSIS — Z0000 Encounter for general adult medical examination without abnormal findings: Secondary | ICD-10-CM | POA: Diagnosis not present

## 2016-08-23 DIAGNOSIS — E785 Hyperlipidemia, unspecified: Secondary | ICD-10-CM

## 2016-08-23 MED ORDER — VALSARTAN 320 MG PO TABS: 320.0000 mg | ORAL_TABLET | Freq: Every day | ORAL | 3 refills | Status: DC

## 2016-08-23 MED ORDER — FUROSEMIDE 40 MG PO TABS: 40.0000 mg | ORAL_TABLET | Freq: Two times a day (BID) | ORAL | 3 refills | Status: DC

## 2016-08-23 MED ORDER — PRAVASTATIN SODIUM 20 MG PO TABS: 20.0000 mg | ORAL_TABLET | Freq: Every day | ORAL | 1 refills | Status: DC

## 2016-08-23 MED ORDER — ZOSTER VAC RECOMB ADJUVANTED 50 MCG/0.5ML IM SUSR: 50.0000 ug | Freq: Once | INTRAMUSCULAR | 1 refills | Status: AC

## 2016-08-23 MED ORDER — CARVEDILOL 12.5 MG PO TABS: 12.5000 mg | ORAL_TABLET | Freq: Two times a day (BID) | ORAL | 3 refills | Status: DC

## 2016-08-23 NOTE — Patient Instructions (Signed)
Preventive Care 70 Years and Older, Male Preventive care refers to lifestyle choices and visits with your health care provider that can promote health and wellness. What does preventive care include?  A yearly physical exam. This is also called an annual well check.  Dental exams once or twice a year.  Routine eye exams. Ask your health care provider how often you should have your eyes checked.  Personal lifestyle choices, including:  Daily care of your teeth and gums.  Regular physical activity.  Eating a healthy diet.  Avoiding tobacco and drug use.  Limiting alcohol use.  Practicing safe sex.  Taking low doses of aspirin every day.  Taking vitamin and mineral supplements as recommended by your health care provider. What happens during an annual well check? The services and screenings done by your health care provider during your annual well check will depend on your age, overall health, lifestyle risk factors, and family history of disease. Counseling  Your health care provider may ask you questions about your:  Alcohol use.  Tobacco use.  Drug use.  Emotional well-being.  Home and relationship well-being.  Sexual activity.  Eating habits.  History of falls.  Memory and ability to understand (cognition).  Work and work environment. Screening  You may have the following tests or measurements:  Height, weight, and BMI.  Blood pressure.  Lipid and cholesterol levels. These may be checked every 5 years, or more frequently if you are over 50 years old.  Skin check.  Lung cancer screening. You may have this screening every year starting at age 55 if you have a 30-pack-year history of smoking and currently smoke or have quit within the past 15 years.  Fecal occult blood test (FOBT) of the stool. You may have this test every year starting at age 50.  Flexible sigmoidoscopy or colonoscopy. You may have a sigmoidoscopy every 5 years or a colonoscopy every 10  years starting at age 50.  Prostate cancer screening. Recommendations will vary depending on your family history and other risks.  Hepatitis C blood test.  Hepatitis B blood test.  Sexually transmitted disease (STD) testing.  Diabetes screening. This is done by checking your blood sugar (glucose) after you have not eaten for a while (fasting). You may have this done every 1-3 years.  Abdominal aortic aneurysm (AAA) screening. You may need this if you are a current or former smoker.  Osteoporosis. You may be screened starting at age 70 if you are at high risk. Talk with your health care provider about your test results, treatment options, and if necessary, the need for more tests. Vaccines  Your health care provider may recommend certain vaccines, such as:  Influenza vaccine. This is recommended every year.  Tetanus, diphtheria, and acellular pertussis (Tdap, Td) vaccine. You may need a Td booster every 10 years.  Varicella vaccine. You may need this if you have not been vaccinated.  Zoster vaccine. You may need this after age 60.  Measles, mumps, and rubella (MMR) vaccine. You may need at least one dose of MMR if you were born in 1957 or later. You may also need a second dose.  Pneumococcal 13-valent conjugate (PCV13) vaccine. One dose is recommended after age 65.  Pneumococcal polysaccharide (PPSV23) vaccine. One dose is recommended after age 65.  Meningococcal vaccine. You may need this if you have certain conditions.  Hepatitis A vaccine. You may need this if you have certain conditions or if you travel or work in places where   you may be exposed to hepatitis A.  Hepatitis B vaccine. You may need this if you have certain conditions or if you travel or work in places where you may be exposed to hepatitis B.  Haemophilus influenzae type b (Hib) vaccine. You may need this if you have certain risk factors. Talk to your health care provider about which screenings and vaccines you  need and how often you need them. This information is not intended to replace advice given to you by your health care provider. Make sure you discuss any questions you have with your health care provider. Document Released: 06/27/2015 Document Revised: 02/18/2016 Document Reviewed: 04/01/2015 Elsevier Interactive Patient Education  2017 [employer].

## 2016-08-23 NOTE — Progress Notes (Signed)
Subjective:  I acted as a Education administrator for Dr. Royden Purl, LPN    Patient ID: Alexander Higgins, male    DOB: 12/28/1945, 71 y.o.   MRN: [id number]  Chief Complaint  Patient presents with  . Hypertension    follow up  . Hyperlipidemia    follow up  . Annual Exam    Hypertension  This is a chronic problem. The current episode started more than 1 year ago. The problem is controlled. Pertinent negatives include no blurred vision, chest pain, headaches or palpitations.  Hyperlipidemia  This is a chronic problem. The current episode started more than 1 year ago. The problem is controlled. Pertinent negatives include no chest pain.    Patient is in today for follow up annual physical, hypertension, and hyperlipidemia. Patient have no concerns at this time.  Patient Care Team: Ann Held, DO as PCP - General (Family Medicine) Erma Heritage, [place] as Physician Assistant (Physician Assistant) Sanda Klein, MD as Consulting Physician (Cardiology)   Past Medical History:  Diagnosis Date  . AICD (automatic cardioverter/defibrillator) present   . Arthritis    "right knee" (04/23/2015)  . Atrial fibrillation ([place])    a. identified on device check in 07/2016.  Marland Kitchen Chronic combined systolic and diastolic CHF (congestive heart failure) ([place]) dx'd 11/2014   a. 05/2016: echo showing EF of 30-35%, Grade 1 DD, trivial MR, mild TR, PA peak pressure 17 mm Hg.   Marland Kitchen Hyperlipidemia   . Hypertension   . Nonischemic cardiomyopathy ([place])    a. s/p ICD placement in 04/2015    Past Surgical History:  Procedure Laterality Date  . EP IMPLANTABLE DEVICE N/A 04/23/2015   Procedure: ICD Implant;  Surgeon: Sanda Klein, MD;  Location: [facility];  Service: Cardiovascular;  Laterality: N/A;  . INSERTION OF ICD  04/23/2015  . TONSILLECTOMY      Family History  Problem Relation Age of Onset  . Hypertension Mother   . Heart disease Mother   . Hypertension Brother   . Heart attack Brother    . Stroke Sister     Social History   Social History  . Marital status: Divorced    Spouse name: N/A  . Number of children: N/A  . Years of education: N/A   Occupational History  . Not on file.   Social History Main Topics  . Smoking status: Never Smoker  . Smokeless tobacco: Never Used  . Alcohol use No  . Drug use: No  . Sexual activity: Yes   Other Topics Concern  . Not on file   Social History Narrative  . No narrative on file    Outpatient Medications Prior to Visit  Medication Sig Dispense Refill  . apixaban (ELIQUIS) 5 MG TABS tablet Take 1 tablet (5 mg total) by mouth 2 (two) times daily. 60 tablet 0  . carvedilol (COREG) 12.5 MG tablet Take 1 tablet (12.5 mg total) by mouth 2 (two) times daily. 180 tablet 3  . furosemide (LASIX) 40 MG tablet Take 1 tablet (40 mg total) by mouth 2 (two) times daily. 180 tablet 3  . pravastatin (PRAVACHOL) 20 MG tablet TAKE ONE TABLET BY MOUTH ONCE DAILY 90 tablet 1  . valsartan (DIOVAN) 320 MG tablet Take 1 tablet (320 mg total) by mouth daily. 90 tablet 3   No facility-administered medications prior to visit.     No Known Allergies  Review of Systems  Constitutional: Negative for fever.  HENT: Negative for  congestion.   Eyes: Negative for blurred vision.  Respiratory: Negative for cough.   Cardiovascular: Negative for chest pain and palpitations.  Gastrointestinal: Negative for vomiting.  Musculoskeletal: Negative for back pain.  Skin: Negative for rash.  Neurological: Negative for loss of consciousness and headaches.       Objective:    Physical Exam  Constitutional: He is oriented to person, place, and time. He appears well-developed and well-nourished. No distress.  HENT:  Head: Normocephalic and atraumatic.  Eyes: Conjunctivae are normal.  Neck: Normal range of motion. No thyromegaly present.  Cardiovascular: Normal rate and regular rhythm.   Pulmonary/Chest: Effort normal and breath sounds normal. He has  no wheezes.  Abdominal: Soft. Bowel sounds are normal. There is no tenderness.  Musculoskeletal: Normal range of motion. He exhibits no edema or deformity.  Neurological: He is alert and oriented to person, place, and time.  Skin: Skin is warm and dry. He is not diaphoretic.  Psychiatric: He has a normal mood and affect.    BP 108/70 (BP Location: Left Arm, Patient Position: Sitting, Cuff Size: Large)   Pulse 66   Temp 97.7 F (36.5 C) (Oral)   Resp 16   Ht 6' (1.829 m)   Wt 286 lb 3.2 oz (129.8 kg)   SpO2 96%   BMI 38.82 kg/m  Wt Readings from Last 3 Encounters:  08/23/16 286 lb 3.2 oz (129.8 kg)  08/13/16 286 lb 9.6 oz (130 kg)  06/25/16 292 lb 9.6 oz (132.7 kg)     Lab Results  Component Value Date   WBC 6.8 08/01/2015   HGB 15.7 08/01/2015   HCT 45.9 08/01/2015   PLT 186.0 08/01/2015   GLUCOSE 94 08/13/2016   CHOL 178 02/11/2016   TRIG 103.0 02/11/2016   HDL 39.30 02/11/2016   LDLCALC 118 (H) 02/11/2016   ALT 30 02/11/2016   AST 37 02/11/2016   NA 139 08/13/2016   K 4.4 08/13/2016   CL 104 08/13/2016   CREATININE 1.30 (H) 08/13/2016   BUN 28 (H) 08/13/2016   CO2 25 08/13/2016   TSH 1.84 08/13/2016   PSA 0.48 08/01/2015   INR 1.08 04/21/2015   MICROALBUR 0.7 08/01/2015    Lab Results  Component Value Date   TSH 1.84 08/13/2016   Lab Results  Component Value Date   WBC 6.8 08/01/2015   HGB 15.7 08/01/2015   HCT 45.9 08/01/2015   MCV 91.3 08/01/2015   PLT 186.0 08/01/2015   Lab Results  Component Value Date   NA 139 08/13/2016   K 4.4 08/13/2016   CO2 25 08/13/2016   GLUCOSE 94 08/13/2016   BUN 28 (H) 08/13/2016   CREATININE 1.30 (H) 08/13/2016   BILITOT 0.6 02/11/2016   ALKPHOS 50 02/11/2016   AST 37 02/11/2016   ALT 30 02/11/2016   PROT 7.7 02/11/2016   ALBUMIN 4.0 02/11/2016   CALCIUM 9.0 08/13/2016   GFR 65.16 02/11/2016   Lab Results  Component Value Date   CHOL 178 02/11/2016   Lab Results  Component Value Date   HDL 39.30  02/11/2016   Lab Results  Component Value Date   LDLCALC 118 (H) 02/11/2016   Lab Results  Component Value Date   TRIG 103.0 02/11/2016   Lab Results  Component Value Date   CHOLHDL 5 02/11/2016   No results found for: HGBA1C     Assessment & Plan:   Problem List Items Addressed This Visit      Unprioritized  Essential hypertension    Stable con't meds      Relevant Medications   carvedilol (COREG) 12.5 MG tablet   furosemide (LASIX) 40 MG tablet   pravastatin (PRAVACHOL) 20 MG tablet   valsartan (DIOVAN) 320 MG tablet   Other Relevant Orders   CBC   Comprehensive metabolic panel   Hyperlipidemia    con't meds Check labs      Relevant Medications   carvedilol (COREG) 12.5 MG tablet   furosemide (LASIX) 40 MG tablet   pravastatin (PRAVACHOL) 20 MG tablet   valsartan (DIOVAN) 320 MG tablet   Other Relevant Orders   Lipid panel    Other Visit Diagnoses    Preventative health care    -  Primary   Encounter for Medicare annual wellness exam          I have changed Mr. Lascala's pravastatin. I am also having him start on Zoster Vac Recomb Adjuvanted. Additionally, I am having him maintain his apixaban, carvedilol, furosemide, and valsartan.  Meds ordered this encounter  Medications  . Zoster Vac Recomb Adjuvanted (SHINGRIX) 50 MCG SUSR    Sig: Inject 50 mcg into the muscle once.    Dispense:  1 each    Refill:  1  . carvedilol (COREG) 12.5 MG tablet    Sig: Take 1 tablet (12.5 mg total) by mouth 2 (two) times daily.    Dispense:  180 tablet    Refill:  3  . furosemide (LASIX) 40 MG tablet    Sig: Take 1 tablet (40 mg total) by mouth 2 (two) times daily.    Dispense:  180 tablet    Refill:  3  . pravastatin (PRAVACHOL) 20 MG tablet    Sig: Take 1 tablet (20 mg total) by mouth daily.    Dispense:  90 tablet    Refill:  1    Please consider 90 day supplies to promote better adherence  . valsartan (DIOVAN) 320 MG tablet    Sig: Take 1 tablet (320 mg  total) by mouth daily.    Dispense:  90 tablet    Refill:  3    CMA served as scribe during this visit. History, Physical and Plan performed by medical provider. Documentation and orders reviewed and attested to.  Ann Held, DO  Patient ID: Alexander Higgins, male   DOB: 1946-05-25, 71 y.o.   MRN: [id number]

## 2016-08-23 NOTE — Assessment & Plan Note (Signed)
Stable con't meds 

## 2016-08-23 NOTE — Progress Notes (Signed)
reviewed

## 2016-08-23 NOTE — Assessment & Plan Note (Signed)
con't meds  Check labs 

## 2016-09-20 ENCOUNTER — Other Ambulatory Visit: Payer: Self-pay | Admitting: Student

## 2016-09-20 NOTE — Telephone Encounter (Signed)
Rx request sent to pharmacy.  

## 2016-09-24 ENCOUNTER — Ambulatory Visit (INDEPENDENT_AMBULATORY_CARE_PROVIDER_SITE_OTHER): Payer: Medicare Other | Admitting: Cardiovascular Disease

## 2016-09-24 ENCOUNTER — Encounter: Payer: Self-pay | Admitting: Cardiovascular Disease

## 2016-09-24 VITALS — BP 134/90 | HR 62 | Ht 72.0 in | Wt 286.0 lb

## 2016-09-24 DIAGNOSIS — I10 Essential (primary) hypertension: Secondary | ICD-10-CM | POA: Diagnosis not present

## 2016-09-24 DIAGNOSIS — I480 Paroxysmal atrial fibrillation: Secondary | ICD-10-CM

## 2016-09-24 DIAGNOSIS — Z7901 Long term (current) use of anticoagulants: Secondary | ICD-10-CM | POA: Diagnosis not present

## 2016-09-24 DIAGNOSIS — I5042 Chronic combined systolic (congestive) and diastolic (congestive) heart failure: Secondary | ICD-10-CM | POA: Diagnosis not present

## 2016-09-24 DIAGNOSIS — Z9189 Other specified personal risk factors, not elsewhere classified: Secondary | ICD-10-CM

## 2016-09-24 DIAGNOSIS — Z95810 Presence of automatic (implantable) cardiac defibrillator: Secondary | ICD-10-CM

## 2016-09-24 MED ORDER — SACUBITRIL-VALSARTAN 49-51 MG PO TABS: 1.0000 | ORAL_TABLET | Freq: Two times a day (BID) | ORAL | 3 refills | Status: DC

## 2016-09-24 MED ORDER — SACUBITRIL-VALSARTAN 97-103 MG PO TABS: 1.0000 | ORAL_TABLET | Freq: Two times a day (BID) | ORAL | 3 refills | Status: DC

## 2016-09-24 NOTE — Progress Notes (Signed)
Patient ID: Alexander Higgins, male   DOB: 03-30-1946, 71 y.o.   MRN: [id number]     Cardiology Office Note    Date:  09/24/2016   ID:  [name], DOB Jan 02, 1946, MRN [id number]  PCP:  Ann Held, DO  Cardiologist:   Quay Burow, M.D.; Sanda Klein, MD   Chief complaint:  Follow-up ICD, atrial fibrillation   History of Present Illness:  Alexander Higgins is a 71 y.o. male , roughly 1 year status post implantation of a [employer] dual-chamber defibrillator for severe nonischemic cardiomyopathy with a left ventricular ejection fraction of approximately 30%  and symptomatic sinus bradycardia.  Mr. Aigner feels great. He is going regularly to the gym 5 days a week where he spends at least an hour and a half each day. After lifting weights he exercises on the bike or elliptical for another 30-50 minutes. He has lost weight, probably still obese, although the BMI however overestimates his true degree of obesity, since he is extremely muscular. His blood pressure is slightly high today but when he checks it at home it is typically 120/70 mmHg. Has not taken his medications yet this morning  Interrogation of his defibrillator shows normal function with an estimated generator longevity of 10.5 years. There have been no episodes of treated ventricular tachycardia or ventricular fibrillation. He has had 22 episodes of nonsustained ventricular tachycardia, all of them very brief. Some episodes of ventricular tachycardia in the monitor zone are atrial or sinus tachycardia.  He has 20% atrial pacing and only 1% ventricular pacing. Occasional episodes of paroxysmal atrial tachycardia have also been recorded. A 2 hour episode of atrial fibrillation occurred 3 months ago, none since. Tolerating oral anticoagulants without complications.  The patient specifically denies any chest pain at rest or with exertion, dyspnea at rest or with exertion, orthopnea, paroxysmal nocturnal dyspnea, syncope,  palpitations, focal neurological deficits, intermittent claudication, lower extremity edema, unexplained weight gain, cough, hemoptysis or wheezing.  The patient also denies abdominal pain, nausea, vomiting, dysphagia, diarrhea, constipation, polyuria, polydipsia, dysuria, hematuria, frequency, urgency, abnormal bleeding or bruising, fever, chills, unexpected weight changes, mood swings, change in skin or hair texture, change in voice quality, auditory or visual problems, allergic reactions or rashes, new musculoskeletal complaints other than usual "aches and pains".     Past Medical History:  Diagnosis Date  . AICD (automatic cardioverter/defibrillator) present   . Arthritis    "right knee" (04/23/2015)  . Atrial fibrillation ([place])    a. identified on device check in 07/2016.  Marland Kitchen Chronic combined systolic and diastolic CHF (congestive heart failure) ([place]) dx'd 11/2014   a. 05/2016: echo showing EF of 30-35%, Grade 1 DD, trivial MR, mild TR, PA peak pressure 17 mm Hg.   Marland Kitchen Hyperlipidemia   . Hypertension   . Nonischemic cardiomyopathy ([place])    a. s/p ICD placement in 04/2015    Past Surgical History:  Procedure Laterality Date  . EP IMPLANTABLE DEVICE N/A 04/23/2015   Procedure: ICD Implant;  Surgeon: Sanda Klein, MD;  Location: [facility];  Service: Cardiovascular;  Laterality: N/A;  . INSERTION OF ICD  04/23/2015  . TONSILLECTOMY      Current Outpatient Prescriptions  Medication Sig Dispense Refill  . carvedilol (COREG) 12.5 MG tablet Take 1 tablet (12.5 mg total) by mouth 2 (two) times daily. 180 tablet 3  . ELIQUIS 5 MG TABS tablet TAKE 1 TABLET BY MOUTH TWICE DAILY 60 tablet 6  . furosemide (LASIX) 40 MG  tablet Take 1 tablet (40 mg total) by mouth 2 (two) times daily. 180 tablet 3  . pravastatin (PRAVACHOL) 20 MG tablet Take 1 tablet (20 mg total) by mouth daily. 90 tablet 1  . sacubitril-valsartan (ENTRESTO) 97-103 MG Take 1 tablet by mouth 2 (two) times daily. 180  tablet 3   No current facility-administered medications for this visit.     Allergies:   Patient has no known allergies.   Social History   Social History  . Marital status: Divorced    Spouse name: N/A  . Number of children: N/A  . Years of education: N/A   Social History Main Topics  . Smoking status: Never Smoker  . Smokeless tobacco: Never Used  . Alcohol use No  . Drug use: No  . Sexual activity: Yes   Other Topics Concern  . Not on file   Social History Narrative  . No narrative on file     Family History:  The patient's family history includes Heart attack in his brother; Heart disease in his mother; Hypertension in his brother and mother; Stroke in his sister.   ROS:   Please see the history of present illness.    ROS All other systems reviewed and are negative.   PHYSICAL EXAM:   VS:  BP 134/90   Pulse 62   Ht 6' (1.829 m)   Wt 129.7 kg (286 lb)   BMI 38.79 kg/m    GEN: Well nourished, well developed, in no acute distress  HEENT: normal  Neck: no JVD, carotid bruits, or masses Cardiac: RRR; no murmurs, rubs, or gallops,no edema ;  Healthy left subclavian ICD site Respiratory:  clear to auscultation bilaterally, normal work of breathing GI: soft, nontender, nondistended, + BS MS: no deformity or atrophy  Skin: warm and dry, no rash Neuro:  Alert and Oriented x 3, Strength and sensation are intact Psych: euthymic mood, full affect  Wt Readings from Last 3 Encounters:  09/24/16 129.7 kg (286 lb)  08/23/16 129.8 kg (286 lb 3.2 oz)  08/13/16 130 kg (286 lb 9.6 oz)      Studies/Labs Reviewed:   EKG:  EKG is ordered today. It shows atrial paced ventricular sensed rhythm with occasional premature atrial beats and at least one atrial couplet. QTC 491 ms  Recent Labs: 02/11/2016: ALT 30 08/13/2016: BUN 28; Creat 1.30; Magnesium 2.1; Potassium 4.4; Sodium 139; TSH 1.84   Lipid Panel    Component Value Date/Time   CHOL 178 02/11/2016 0814   TRIG  103.0 02/11/2016 0814   HDL 39.30 02/11/2016 0814   CHOLHDL 5 02/11/2016 0814   VLDL 20.6 02/11/2016 0814   LDLCALC 118 (H) 02/11/2016 0814      ASSESSMENT:    1. Chronic combined systolic and diastolic CHF (congestive heart failure) ([place])   2. ICD (implantable cardioverter-defibrillator) in place   3. Paroxysmal atrial fibrillation ([facility])   4. Long term current use of anticoagulant      PLAN:  In order of problems listed above:  1.  CHF: Nonischemic cardiomyopathy. Good functional statusUnfortunately, LVEF remains moderately to severely depressed, 30-35%. Euvolemic without diuretic therapy. For better outcome recommend switching from valsartan to [facility]. Samples for 49/51 mg dose x 2 weeks given, anticipate he will tolerate 97/103 dose. F/U w clinical pharmacist in 2 weeks. 2. ICD: Normally functioning dual-chamber defibrillator. Excellent lead parameters. Occasional brief episodes of nonsustained VT recorded, none requiring therapy. Remote download every 3 months. Yearly office visit. 3. Afib: arrhythmia  burden is very low. CHADSVasc 3 (age, HF, HTN), on anticoagulant. 4. Eliquis: no bleeding complications 5. HTN: reevaluate after titration of Entresto.   Medication Adjustments/Labs and Tests Ordered: Current medicines are reviewed at length with the patient today.  Concerns regarding medicines are outlined above.  Medication changes, Labs and Tests ordered today are listed below. Patient Instructions  Dr Sallyanne Kuster has recommended making the following medication changes: 1. STOP Valsartan 2. START Entresto >>TAKE the 49-51 dose twice daily, starting tomorrow.  >>You have been scheduled to see one of our clinical pharmacists on Thursday, April 26th, at 2:00p. I have sent a prescription to your pharmacy for the 97-103 dose, do NOT pick this up until this appointment.  Remote monitoring is used to monitor your Pacemaker of ICD from home. This monitoring reduces the number of  office visits required to check your device to one time per year. It allows [place] to keep an eye on the functioning of your device to ensure it is working properly. You are scheduled for a device check from home on Friday, July 13th, 2018. You may send your transmission at any time that day. If you have a wireless device, the transmission will be sent automatically. After your physician reviews your transmission, you will receive a postcard with your next transmission date.  Dr Sallyanne Kuster recommends that you schedule a follow-up appointment in 12 months with a defibrillator check. You will receive a reminder letter in the mail two months in advance. If you don't receive a letter, please call our office to schedule the follow-up appointment.  If you need a refill on your cardiac medications before your next appointment, please call your pharmacy.     Signed, Sanda Klein, MD  09/24/2016 1:24 PM    [facility] [place], [place], [place]  [address] Phone: [phone]; Fax: [phone]

## 2016-09-24 NOTE — Patient Instructions (Signed)
Dr Sallyanne Kuster has recommended making the following medication changes: 1. STOP Valsartan 2. START Entresto >>TAKE the 49-51 dose twice daily, starting tomorrow.  >>You have been scheduled to see one of our clinical pharmacists on Thursday, April 26th, at 2:00p. I have sent a prescription to your pharmacy for the 97-103 dose, do NOT pick this up until this appointment.  Remote monitoring is used to monitor your Pacemaker of ICD from home. This monitoring reduces the number of office visits required to check your device to one time per year. It allows [place] to keep an eye on the functioning of your device to ensure it is working properly. You are scheduled for a device check from home on Friday, July 13th, 2018. You may send your transmission at any time that day. If you have a wireless device, the transmission will be sent automatically. After your physician reviews your transmission, you will receive a postcard with your next transmission date.  Dr Sallyanne Kuster recommends that you schedule a follow-up appointment in 12 months with a defibrillator check. You will receive a reminder letter in the mail two months in advance. If you don't receive a letter, please call our office to schedule the follow-up appointment.  If you need a refill on your cardiac medications before your next appointment, please call your pharmacy.

## 2016-10-06 LAB — CUP PACEART INCLINIC DEVICE CHECK
Battery Remaining Percentage: 100 %
Brady Statistic RA Percent Paced: 20 %
HighPow Impedance: 72 Ohm
Implantable Lead Implant Date: 20161109
Implantable Lead Location: 753859
Implantable Lead Location: 753860
Implantable Lead Model: 293
Implantable Lead Model: 7741
Implantable Lead Serial Number: 710559
Lead Channel Impedance Value: 465 Ohm
Lead Channel Impedance Value: 625 Ohm
Lead Channel Pacing Threshold Pulse Width: 0.4 ms
Lead Channel Setting Pacing Amplitude: 2.4 V
MDC IDC LEAD IMPLANT DT: 20161109
MDC IDC LEAD SERIAL: 385866
MDC IDC MSMT BATTERY REMAINING LONGEVITY: 126 mo
MDC IDC MSMT LEADCHNL RA PACING THRESHOLD AMPLITUDE: 0.8 V
MDC IDC MSMT LEADCHNL RV PACING THRESHOLD AMPLITUDE: 0.8 V
MDC IDC MSMT LEADCHNL RV PACING THRESHOLD PULSEWIDTH: 0.4 ms
MDC IDC PG IMPLANT DT: 20161109
MDC IDC SESS DTM: 20180413142700
MDC IDC SET LEADCHNL RA PACING AMPLITUDE: 2.4 V
MDC IDC SET LEADCHNL RV PACING PULSEWIDTH: 0.4 ms
MDC IDC SET LEADCHNL RV SENSING SENSITIVITY: 0.6 mV
MDC IDC STAT BRADY RV PERCENT PACED: 1 %
Pulse Gen Serial Number: 512157

## 2016-10-07 ENCOUNTER — Ambulatory Visit (INDEPENDENT_AMBULATORY_CARE_PROVIDER_SITE_OTHER): Payer: Medicare Other | Admitting: Pharmacist Clinician (PhC)/ Clinical Pharmacy Specialist

## 2016-10-07 VITALS — BP 122/82 | HR 68 | Ht 72.0 in | Wt 282.6 lb

## 2016-10-07 DIAGNOSIS — I428 Other cardiomyopathies: Secondary | ICD-10-CM

## 2016-10-07 DIAGNOSIS — I5042 Chronic combined systolic (congestive) and diastolic (congestive) heart failure: Secondary | ICD-10-CM | POA: Diagnosis not present

## 2016-10-07 LAB — BASIC METABOLIC PANEL
BUN: 23 mg/dL (ref 7–25)
CHLORIDE: 106 mmol/L (ref 98–110)
CO2: 18 mmol/L — ABNORMAL LOW (ref 20–31)
Calcium: 9.3 mg/dL (ref 8.6–10.3)
Creat: 1.31 mg/dL — ABNORMAL HIGH (ref 0.70–1.25)
Glucose, Bld: 104 mg/dL — ABNORMAL HIGH (ref 65–99)
POTASSIUM: 4.3 mmol/L (ref 3.5–5.3)
SODIUM: 140 mmol/L (ref 135–146)

## 2016-10-07 NOTE — Progress Notes (Signed)
Thank you MCr 

## 2016-10-07 NOTE — Progress Notes (Signed)
10/07/2016 Alexander Higgins 28-Jun-1945 [id number]   HPI:  Alexander Higgins is a 71 y.o. male patient of Dr Sallyanne Kuster, with a PMH below who presents today for Entresto titration.  He has combined systolic and diastolic CHF, with a dual-chamber defibrillator and EF of 30%.   At his visit with Dr. Sallyanne Kuster the valsartan was discontinued and he was started on Entresto 49/51.    Today he is in the office and reports feeling well.  No chest pain, shortness of breath, lower extremity edema or dizziness.  He does have a home blood pressure monitor, but has not used it in some time.    Blood Pressure Goal:  130/80   Current Medications:  Entresto 49/51  Furosemide 40 mg bid  Family Hx:  mother had some "heart disease" died at 74,  Brother died with CHF at 20  1 sister with stroke at age 78, still living  1 sister healthy  Daughter had some issues until lost significant weight  Social Hx:  Decaf coffee only; no alcohol; no tobacco Diet:  Patient states he tends to eat the same foods most days of the week; will occasionally go out to eat with one of his children or grandchildren, but otherwise tends to eat the following:  Breakfast = 2-3 boiled eggs with [place] bacon, piece of fruit  Lunch = Tuna/chicken salad wrapped in lettuce, baked sweet potato or other vegetable  Dinner = baked fish or grilled in olive oil, mixed vegetables   Exercise:  Gym daily (at least 5 days per week)  Home BP readings:  Has home cuff, has not used.    Intolerances:   none  Wt Readings from Last 3 Encounters:  10/07/16 282 lb 9.6 oz (128.2 kg)  09/24/16 286 lb (129.7 kg)  08/23/16 286 lb 3.2 oz (129.8 kg)   BP Readings from Last 3 Encounters:  10/07/16 122/82  09/24/16 134/90  08/23/16 108/70   Pulse Readings from Last 3 Encounters:  10/07/16 68  09/24/16 62  08/23/16 66    Current Outpatient Prescriptions  Medication Sig Dispense Refill  . carvedilol (COREG) 12.5 MG tablet Take 1 tablet  (12.5 mg total) by mouth 2 (two) times daily. 180 tablet 3  . ELIQUIS 5 MG TABS tablet TAKE 1 TABLET BY MOUTH TWICE DAILY 60 tablet 6  . furosemide (LASIX) 40 MG tablet Take 1 tablet (40 mg total) by mouth 2 (two) times daily. 180 tablet 3  . pravastatin (PRAVACHOL) 20 MG tablet Take 1 tablet (20 mg total) by mouth daily. 90 tablet 1  . sacubitril-valsartan (ENTRESTO) 97-103 MG Take 1 tablet by mouth 2 (two) times daily. 180 tablet 3   No current facility-administered medications for this visit.     No Known Allergies  Past Medical History:  Diagnosis Date  . AICD (automatic cardioverter/defibrillator) present   . Arthritis    "right knee" (04/23/2015)  . Atrial fibrillation ([place])    a. identified on device check in 07/2016.  Marland Kitchen Chronic combined systolic and diastolic CHF (congestive heart failure) ([place]) dx'd 11/2014   a. 05/2016: echo showing EF of 30-35%, Grade 1 DD, trivial MR, mild TR, PA peak pressure 17 mm Hg.   Marland Kitchen Hyperlipidemia   . Hypertension   . Nonischemic cardiomyopathy ([place])    a. s/p ICD placement in 04/2015    Blood pressure 122/82, pulse 68, height 6' (1.829 m), weight 282 lb 9.6 oz (128.2 kg).  Chronic combined systolic and diastolic CHF (congestive  heart failure) [facility]) Patient doing well since switching from valsartan to [facility] 49/51.  His BP is stable today at 122/82 and he reports no dizziness, SOB or fatigue.  Will increase his dose to 97/103 mg twice daily.  He is to go to the lab today for a BMET, then call in 2 weeks to let us know how his home BP reading look.  He continues with furosemide 40 mg twice daily.  Today his weight is down about 4 pounds, he attributes this to getting back to his normal eating patterns.      Tommy Medal PharmD CPP [facility]

## 2016-10-07 NOTE — Patient Instructions (Signed)
Call in 2 weeks to let us know what your BP readings are.  Call [phone] Erasmo Downer or Raquel)  Your blood pressure today is 122/82  Check your blood pressure at home daily and keep record of the readings.  Take your BP meds as follows:  Increase Entresto to 97/103 mg dose.  Continue taking twice daily  Go to the lab today and get blood draw.  Bring all of your meds, your BP cuff and your record of home blood pressures to your next appointment.  Exercise as you're able, try to walk approximately 30 minutes per day.  Keep salt intake to a minimum, especially watch canned and prepared boxed foods.  Eat more fresh fruits and vegetables and fewer canned items.  Avoid eating in fast food restaurants.    HOW TO TAKE YOUR BLOOD PRESSURE: . Rest 5 minutes before taking your blood pressure. .  Don't smoke or drink caffeinated beverages for at least 30 minutes before. . Take your blood pressure before (not after) you eat. . Sit comfortably with your back supported and both feet on the floor (don't cross your legs). . Elevate your arm to heart level on a table or a desk. . Use the proper sized cuff. It should fit smoothly and snugly around your bare upper arm. There should be enough room to slip a fingertip under the cuff. The bottom edge of the cuff should be 1 inch above the crease of the elbow. . Ideally, take 3 measurements at one sitting and record the average.

## 2016-10-07 NOTE — Assessment & Plan Note (Signed)
Patient doing well since switching from valsartan to [employer] [facility].  His BP is stable today at 122/82 and he reports no dizziness, SOB or fatigue.  Will increase his dose to 97/103 mg twice daily.  He is to go to the lab today for a BMET, then call in 2 weeks to let us know how his home BP reading look.  He continues with furosemide 40 mg twice daily.  Today his weight is down about 4 pounds, he attributes this to getting back to his normal eating patterns.

## 2016-10-08 ENCOUNTER — Telehealth: Payer: Self-pay | Admitting: *Deleted

## 2016-10-08 NOTE — Telephone Encounter (Signed)
Spoke to patient. Result given . Verbalized understanding Patient states he will not be able to get Entresto 97/103  until Monday . Pharmacy has to order medications.  samples  49/51 mg  TO DOUBLE DOSE TWICE A DAY. Okayed by Dr Sallyanne Kuster. Patient will pick samples  Lot [id number] EXP OCT 2019 X1/

## 2016-10-08 NOTE — Telephone Encounter (Signed)
-----   Message from Sanda Klein, MD sent at 10/08/2016  9:13 AM EDT ----- Labs show unchanged kidney function after med changes

## 2016-12-03 ENCOUNTER — Telehealth: Payer: Self-pay | Admitting: Cardiovascular Disease

## 2016-12-03 ENCOUNTER — Other Ambulatory Visit: Payer: Self-pay | Admitting: Family Medicine

## 2016-12-03 NOTE — Telephone Encounter (Signed)
New message       Talk to the nurse regarding bp medication pt was recently put on.  Daughter cannot remember the name of the medication.  Please call before you leave today

## 2016-12-03 NOTE — Telephone Encounter (Signed)
Spoke to daughter, DPR, answered questions regarding CHF diagnosis and entresto use. She voices pt has been compliant w medications. No new problems.  Aware to call again if new concerns, o/w plan to follow up as advised.

## 2016-12-24 ENCOUNTER — Ambulatory Visit (INDEPENDENT_AMBULATORY_CARE_PROVIDER_SITE_OTHER): Payer: Medicare Other | Admitting: *Deleted

## 2016-12-24 DIAGNOSIS — I428 Other cardiomyopathies: Secondary | ICD-10-CM | POA: Diagnosis not present

## 2016-12-24 NOTE — Progress Notes (Signed)
Remote ICD transmission.   

## 2016-12-28 ENCOUNTER — Encounter: Payer: Self-pay | Admitting: Cardiology

## 2017-01-06 ENCOUNTER — Telehealth: Payer: Self-pay | Admitting: Family Medicine

## 2017-01-06 NOTE — Telephone Encounter (Signed)
Pt's daughter called in to request a call back from assistance.   [name] [phone]

## 2017-01-07 NOTE — Telephone Encounter (Signed)
Patient was given testosterone injections/prescription by urologist you referred him to in February 2018. His daughter stated he has never used/and put it in the freezer. Now the patient wants her daughter to start giving him shots/she did not know what to do, but to call PCP for advice. I did tell her you did not prescribe and to call the MD on bottle first?then would forward this message to you as well.

## 2017-01-07 NOTE — Telephone Encounter (Signed)
He will need to go to back to the urologist or call them since they wrote it -- we do not give those shots

## 2017-01-07 NOTE — Telephone Encounter (Signed)
Called the daughter back and informed of PCP instructions.

## 2017-01-07 NOTE — Telephone Encounter (Signed)
Called the daughter left a message to call back.

## 2017-01-21 LAB — CUP PACEART REMOTE DEVICE CHECK
Battery Remaining Longevity: 126 mo
Brady Statistic RV Percent Paced: 1 %
Date Time Interrogation Session: 20180713050100
HIGH POWER IMPEDANCE MEASURED VALUE: 82 Ohm
Implantable Lead Location: 753859
Implantable Lead Location: 753860
Implantable Lead Serial Number: 710559
Implantable Pulse Generator Implant Date: 20161109
Lead Channel Impedance Value: 548 Ohm
Lead Channel Pacing Threshold Amplitude: 0.8 V
Lead Channel Pacing Threshold Pulse Width: 0.4 ms
Lead Channel Pacing Threshold Pulse Width: 0.4 ms
Lead Channel Setting Pacing Pulse Width: 0.4 ms
Lead Channel Setting Sensing Sensitivity: 0.6 mV
MDC IDC LEAD IMPLANT DT: 20161109
MDC IDC LEAD IMPLANT DT: 20161109
MDC IDC LEAD SERIAL: 385866
MDC IDC MSMT BATTERY REMAINING PERCENTAGE: 100 %
MDC IDC MSMT LEADCHNL RA IMPEDANCE VALUE: 760 Ohm
MDC IDC MSMT LEADCHNL RA PACING THRESHOLD AMPLITUDE: 0.8 V
MDC IDC SET LEADCHNL RA PACING AMPLITUDE: 2.4 V
MDC IDC SET LEADCHNL RV PACING AMPLITUDE: 2.4 V
MDC IDC STAT BRADY RA PERCENT PACED: 20 %
Pulse Gen Serial Number: 512157

## 2017-02-18 ENCOUNTER — Telehealth: Payer: Self-pay | Admitting: Cardiovascular Disease

## 2017-02-18 NOTE — Telephone Encounter (Signed)
Received call from daughter (ok per DPR)-states patient went to pick up Eliquis and Entresto this AM and he is going to be unable to afford either medication.  Reports he is out of both medications at this time.     Gave daughter # for Eliquis assistance, also samples provided.     Spoke to pharmacist in regards to [place]. Will provide samples of Entresto 49/51 with instructions to take 2 tablets BID.   Assistance number for Entresto to be provided.     Left message for daughter to call back.

## 2017-02-18 NOTE — Telephone Encounter (Signed)
New message   Pt c/o medication issue:  1. Name of Medication: ELIQUIS 5 MG TABS tablet  2. How are you currently taking this medication (dosage and times per day)? 5mg   3. Are you having a reaction (difficulty breathing--STAT)? no  4. What is your medication issue? Cannot afford this medication anymore. Pt would like to be prescribed something cheaper

## 2017-02-18 NOTE — Telephone Encounter (Signed)
Left message to call back  

## 2017-02-18 NOTE — Telephone Encounter (Signed)
lmtcb

## 2017-02-21 NOTE — Telephone Encounter (Signed)
Phone goes to VM - left msg to call and to inform samples available.

## 2017-02-21 NOTE — Telephone Encounter (Signed)
New Message     Pt returned called through the answering service

## 2017-02-25 NOTE — Telephone Encounter (Signed)
Alexander Higgins, continue help with this please

## 2017-02-25 NOTE — Telephone Encounter (Signed)
Spoke to daughter. Informed her samples are available for pick up.  She states  medication cost was $150 and $170 patient is unable to pay that - per daughter patient is in the donut-hole.  Aware will defer to DR Gwenlyn Found to see if there is other options  Patient aware will pick up samples

## 2017-02-28 NOTE — Telephone Encounter (Signed)
Alexander Higgins - have you contacted patient about filling out patient assistance forms?  Hildred Alamin was working with them about 10 days ago, but not sure where she left off.

## 2017-02-28 NOTE — Telephone Encounter (Signed)
This was just routed to me on Friday. Will call patient today to follow-up.

## 2017-03-07 ENCOUNTER — Other Ambulatory Visit: Payer: Self-pay | Admitting: Cardiovascular Disease

## 2017-03-07 MED ORDER — APIXABAN 5 MG PO TABS: 5.0000 mg | ORAL_TABLET | Freq: Two times a day (BID) | ORAL | 1 refills | Status: DC

## 2017-03-07 MED ORDER — SACUBITRIL-VALSARTAN 97-103 MG PO TABS: 1.0000 | ORAL_TABLET | Freq: Two times a day (BID) | ORAL | 11 refills | Status: DC

## 2017-03-07 NOTE — Telephone Encounter (Signed)
Spoke to [place], [place] concerning this. Dr. Sallyanne Kuster follows his ICD and would need Dr. Gwenlyn Found to start patient assitance. She stated Hayley, RN had been working on this. Routing to her to find out what needs to be done.

## 2017-03-07 NOTE — Telephone Encounter (Signed)
Spoke to pt and informed him that I have started patient assistance forms for Entresto and Eliquis. I have filled out sections to be filled out by [place] and will have signed by Dr. Gwenlyn Found. Informed him that there are some parts he will need to fill out as I do not have that information. Told pt I will leave these forms at front desk after they are signed.  Pt stated his daughter will be able to pick up [employer].

## 2017-03-07 NOTE — Telephone Encounter (Signed)
Spoke to pt. Pt stated Dr. Loletha Grayer was going to look into patient assistance forms for Eliquis and Entresto. Told pt I will send a message to [facility], [place] and see where we are on those. If we have not started them, I will start those for him and get back to him with any info.

## 2017-03-07 NOTE — Telephone Encounter (Signed)
Primary nurse aware that [facility] assistance # given, assistance forms were not initiated at the time of the triage call.   Primary will initiate assistance forms.

## 2017-03-11 NOTE — Telephone Encounter (Signed)
Pt daughter picked up forms today. Explained to fill out all highlighted areas and pt can either bring back and I will fax them or he can fax them. Pt verbalized understanding.

## 2017-03-28 ENCOUNTER — Ambulatory Visit (INDEPENDENT_AMBULATORY_CARE_PROVIDER_SITE_OTHER): Payer: Medicare Other | Admitting: *Deleted

## 2017-03-28 DIAGNOSIS — I428 Other cardiomyopathies: Secondary | ICD-10-CM | POA: Diagnosis not present

## 2017-03-28 NOTE — Progress Notes (Signed)
Remote ICD transmission.   

## 2017-03-31 LAB — CUP PACEART REMOTE DEVICE CHECK
Battery Remaining Longevity: 126 mo
Battery Remaining Percentage: 100 %
Date Time Interrogation Session: 20181015050200
HighPow Impedance: 89 Ohm
Implantable Lead Implant Date: 20161109
Implantable Lead Location: 753860
Implantable Pulse Generator Implant Date: 20161109
Lead Channel Pacing Threshold Pulse Width: 0.4 ms
Lead Channel Setting Pacing Amplitude: 2.4 V
Lead Channel Setting Pacing Amplitude: 2.4 V
Lead Channel Setting Pacing Pulse Width: 0.4 ms
Lead Channel Setting Sensing Sensitivity: 0.6 mV
MDC IDC LEAD IMPLANT DT: 20161109
MDC IDC LEAD LOCATION: 753859
MDC IDC LEAD SERIAL: 385866
MDC IDC LEAD SERIAL: 710559
MDC IDC MSMT LEADCHNL RA IMPEDANCE VALUE: 736 Ohm
MDC IDC MSMT LEADCHNL RA PACING THRESHOLD AMPLITUDE: 0.8 V
MDC IDC MSMT LEADCHNL RA PACING THRESHOLD PULSEWIDTH: 0.4 ms
MDC IDC MSMT LEADCHNL RV IMPEDANCE VALUE: 514 Ohm
MDC IDC MSMT LEADCHNL RV PACING THRESHOLD AMPLITUDE: 0.8 V
MDC IDC STAT BRADY RA PERCENT PACED: 20 %
MDC IDC STAT BRADY RV PERCENT PACED: 1 %
Pulse Gen Serial Number: 512157

## 2017-04-01 ENCOUNTER — Encounter: Payer: Self-pay | Admitting: Cardiology

## 2017-04-21 ENCOUNTER — Telehealth: Payer: Self-pay | Admitting: Cardiovascular Disease

## 2017-04-21 NOTE — Telephone Encounter (Signed)
Received call from daughter-cannot locate assistance forms.  Advised I would route to primary nurse to get assistance forms printed and filled out.  Advised I would have her call when this is ready.

## 2017-04-21 NOTE — Telephone Encounter (Signed)
Received call back from daughter-states she called and they never received the paperwork that she faxed and she is unsure she still has the paperwork.   Advised we do have Eliquis samples that we can provide but do not have Entresto samples at this dosage.   Samples will be placed at the front desk for pickup.  Daughter will try to locate forms to refax.  Will notify us if we need to start over with the assistance forms.   Medication samples have been provided to the patient.  Drug name: Eliquis 5 mg  Qty: 3 weeks  LOT: [id number]  Exp.Date: 3/21

## 2017-04-21 NOTE — Telephone Encounter (Signed)
Spoke with pt dtr, aware samples are at the front desk for pick up. Forms will be available to pick up once signed by dr berry.

## 2017-04-21 NOTE — Telephone Encounter (Signed)
Returned call to daughter (ok per DPR)-states they faxed the assistance paperwork for Eliquis and Entresto and have not heard back.   Advised daughter to call the # for Delene Loll and Eliquis patient assistance (# provided) to see what the status of the applications are.  If they need anything further from up, daughter will let us know.    Routed to primary to make aware.

## 2017-04-21 NOTE — Telephone Encounter (Signed)
New message  Pt daughter verbalized that she is calling for the rn   She said that she was told to call back to pick up the samples of medication and the forms   Told her I will send message to rn and she said she is on her way to the office

## 2017-04-21 NOTE — Telephone Encounter (Signed)
Patient daughter [name]) calling,  States that she is calling in regards to assistance with patient Alexander Higgins and Eliquis medications. Please f/u

## 2017-05-31 DIAGNOSIS — L811 Chloasma: Secondary | ICD-10-CM | POA: Diagnosis not present

## 2017-05-31 DIAGNOSIS — L219 Seborrheic dermatitis, unspecified: Secondary | ICD-10-CM | POA: Diagnosis not present

## 2017-06-27 ENCOUNTER — Ambulatory Visit (INDEPENDENT_AMBULATORY_CARE_PROVIDER_SITE_OTHER): Payer: Medicare Other | Admitting: *Deleted

## 2017-06-27 DIAGNOSIS — I428 Other cardiomyopathies: Secondary | ICD-10-CM | POA: Diagnosis not present

## 2017-06-29 NOTE — Progress Notes (Signed)
Remote ICD transmission.   

## 2017-06-30 LAB — CUP PACEART REMOTE DEVICE CHECK
Battery Remaining Longevity: 126 mo
Battery Remaining Percentage: 100 %
Brady Statistic RA Percent Paced: 20 %
Brady Statistic RV Percent Paced: 1 %
HIGH POWER IMPEDANCE MEASURED VALUE: 78 Ohm
Implantable Lead Implant Date: 20161109
Implantable Lead Location: 753860
Implantable Lead Model: 293
Implantable Lead Model: 7741
Implantable Lead Serial Number: 710559
Implantable Pulse Generator Implant Date: 20161109
Lead Channel Impedance Value: 774 Ohm
Lead Channel Pacing Threshold Amplitude: 0.8 V
Lead Channel Pacing Threshold Pulse Width: 0.4 ms
Lead Channel Setting Pacing Amplitude: 2.4 V
Lead Channel Setting Pacing Pulse Width: 0.4 ms
MDC IDC LEAD IMPLANT DT: 20161109
MDC IDC LEAD LOCATION: 753859
MDC IDC LEAD SERIAL: 385866
MDC IDC MSMT LEADCHNL RV IMPEDANCE VALUE: 501 Ohm
MDC IDC MSMT LEADCHNL RV PACING THRESHOLD AMPLITUDE: 0.8 V
MDC IDC MSMT LEADCHNL RV PACING THRESHOLD PULSEWIDTH: 0.4 ms
MDC IDC PG SERIAL: 512157
MDC IDC SESS DTM: 20190115225900
MDC IDC SET LEADCHNL RA PACING AMPLITUDE: 2.4 V
MDC IDC SET LEADCHNL RV SENSING SENSITIVITY: 0.6 mV

## 2017-07-01 ENCOUNTER — Encounter: Payer: Self-pay | Admitting: Cardiology

## 2017-07-27 ENCOUNTER — Telehealth: Payer: Self-pay | Admitting: Cardiovascular Disease

## 2017-07-27 NOTE — Telephone Encounter (Signed)
New message    Patient daughter calling to request assistance in getting medication cheaper. She provided phone # [phone] to call to help get discount, she did not know the name of the company. Please call Alexander Higgins  Pt c/o medication issue:  1. Name of Medication: Entresto, Eliquis  2. How are you currently taking this medication (dosage and times per day)? As prescribed  3. Are you having a reaction (difficulty breathing--STAT)? No  4. What is your medication issue? Too costlty

## 2017-07-27 NOTE — Telephone Encounter (Signed)
Patient's daughter called stating that the patient needs assistance with Entresto and Eliquis. She has provided the number below.  Samples of eliquis have been provided but we do not carry the [facility] in that dosage. The patient is currently out of these medications  Medication Samples have been provided to the patient.  Drug name: Eliquis       Strength: 5 mg        Qty: 2 boxes  LOT: [id number]  Exp.Date: 3/21  Message routed to the provider's assistant.

## 2017-07-27 NOTE — Telephone Encounter (Signed)
New message       *STAT* If patient is at the pharmacy, call can be transferred to refill team.   1. Which medications need to be refilled? (please list name of each medication and dose if known) apixaban (ELIQUIS) 5 MG TABS tablet and sacubitril-valsartan (ENTRESTO) 97-103 MG  2. Which pharmacy/location (including street and city if local pharmacy) is medication to be sent to? [place], [place] - [address]  3. Do they need a 30 day or 90 day supply? [place]

## 2017-07-28 ENCOUNTER — Other Ambulatory Visit: Payer: Self-pay | Admitting: Pharmacist Clinician (PhC)/ Clinical Pharmacy Specialist

## 2017-07-28 MED ORDER — SACUBITRIL-VALSARTAN 97-103 MG PO TABS: 1.0000 | ORAL_TABLET | Freq: Two times a day (BID) | ORAL | 1 refills | Status: DC

## 2017-07-28 MED ORDER — APIXABAN 5 MG PO TABS: 5.0000 mg | ORAL_TABLET | Freq: Two times a day (BID) | ORAL | 1 refills | Status: DC

## 2017-07-28 NOTE — Addendum Note (Signed)
Addended by: Rockne Menghini on: 07/28/2017 02:24 PM   Modules accepted: Orders

## 2017-07-28 NOTE — Addendum Note (Signed)
Addended by: Venetia Maxon on: 07/28/2017 01:48 PM   Modules accepted: Orders

## 2017-07-29 ENCOUNTER — Telehealth: Payer: Self-pay | Admitting: Cardiovascular Disease

## 2017-07-29 NOTE — Telephone Encounter (Signed)
New message    Cover MyMeds is calling for status of authorization for Entresto. Please use ref key # [id number] when returning call to [phone].  Pt c/o medication issue:  1. Name of Medication: Entresto  2. How are you currently taking this medication (dosage and times per day)? As prescribed  3. Are you having a reaction (difficulty breathing--STAT)? NO  4. What is your medication issue? Authorization requested from Cover My Meds

## 2017-08-01 NOTE — Telephone Encounter (Signed)
Waiting for confirmation. Plan has been submitted.

## 2017-08-05 ENCOUNTER — Other Ambulatory Visit: Payer: Self-pay | Admitting: Student

## 2017-08-05 NOTE — Telephone Encounter (Signed)
REFILL 

## 2017-08-05 NOTE — Telephone Encounter (Signed)
This is Dr. Berry's pt 

## 2017-08-22 DIAGNOSIS — L219 Seborrheic dermatitis, unspecified: Secondary | ICD-10-CM | POA: Diagnosis not present

## 2017-08-22 DIAGNOSIS — L811 Chloasma: Secondary | ICD-10-CM | POA: Diagnosis not present

## 2017-09-01 ENCOUNTER — Encounter: Payer: Self-pay | Admitting: Family Medicine

## 2017-09-01 ENCOUNTER — Ambulatory Visit (INDEPENDENT_AMBULATORY_CARE_PROVIDER_SITE_OTHER): Payer: Medicare Other | Admitting: Family Medicine

## 2017-09-01 VITALS — BP 128/82 | HR 56 | Temp 97.6°F | Resp 16 | Ht 72.0 in | Wt 290.8 lb

## 2017-09-01 DIAGNOSIS — I10 Essential (primary) hypertension: Secondary | ICD-10-CM | POA: Diagnosis not present

## 2017-09-01 DIAGNOSIS — E785 Hyperlipidemia, unspecified: Secondary | ICD-10-CM

## 2017-09-01 DIAGNOSIS — I480 Paroxysmal atrial fibrillation: Secondary | ICD-10-CM | POA: Diagnosis not present

## 2017-09-01 DIAGNOSIS — I5042 Chronic combined systolic (congestive) and diastolic (congestive) heart failure: Secondary | ICD-10-CM

## 2017-09-01 DIAGNOSIS — Z23 Encounter for immunization: Secondary | ICD-10-CM | POA: Diagnosis not present

## 2017-09-01 DIAGNOSIS — Z0000 Encounter for general adult medical examination without abnormal findings: Secondary | ICD-10-CM | POA: Diagnosis not present

## 2017-09-01 MED ORDER — CARVEDILOL 12.5 MG PO TABS: 12.5000 mg | ORAL_TABLET | Freq: Two times a day (BID) | ORAL | 3 refills | Status: DC

## 2017-09-01 NOTE — Patient Instructions (Signed)
Preventive Care 65 Years and Older, Male Preventive care refers to lifestyle choices and visits with your health care provider that can promote health and wellness. What does preventive care include?  A yearly physical exam. This is also called an annual well check.  Dental exams once or twice a year.  Routine eye exams. Ask your health care provider how often you should have your eyes checked.  Personal lifestyle choices, including: ? Daily care of your teeth and gums. ? Regular physical activity. ? Eating a healthy diet. ? Avoiding tobacco and drug use. ? Limiting alcohol use. ? Practicing safe sex. ? Taking low doses of aspirin every day. ? Taking vitamin and mineral supplements as recommended by your health care provider. What happens during an annual well check? The services and screenings done by your health care provider during your annual well check will depend on your age, overall health, lifestyle risk factors, and family history of disease. Counseling Your health care provider may ask you questions about your:  Alcohol use.  Tobacco use.  Drug use.  Emotional well-being.  Home and relationship well-being.  Sexual activity.  Eating habits.  History of falls.  Memory and ability to understand (cognition).  Work and work environment.  Screening You may have the following tests or measurements:  Height, weight, and BMI.  Blood pressure.  Lipid and cholesterol levels. These may be checked every 5 years, or more frequently if you are over 50 years old.  Skin check.  Lung cancer screening. You may have this screening every year starting at age 55 if you have a 30-pack-year history of smoking and currently smoke or have quit within the past 15 years.  Fecal occult blood test (FOBT) of the stool. You may have this test every year starting at age 50.  Flexible sigmoidoscopy or colonoscopy. You may have a sigmoidoscopy every 5 years or a colonoscopy every 10  years starting at age 50.  Prostate cancer screening. Recommendations will vary depending on your family history and other risks.  Hepatitis C blood test.  Hepatitis B blood test.  Sexually transmitted disease (STD) testing.  Diabetes screening. This is done by checking your blood sugar (glucose) after you have not eaten for a while (fasting). You may have this done every 1-3 years.  Abdominal aortic aneurysm (AAA) screening. You may need this if you are a current or former smoker.  Osteoporosis. You may be screened starting at age 70 if you are at high risk.  Talk with your health care provider about your test results, treatment options, and if necessary, the need for more tests. Vaccines Your health care provider may recommend certain vaccines, such as:  Influenza vaccine. This is recommended every year.  Tetanus, diphtheria, and acellular pertussis (Tdap, Td) vaccine. You may need a Td booster every 10 years.  Varicella vaccine. You may need this if you have not been vaccinated.  Zoster vaccine. You may need this after age 60.  Measles, mumps, and rubella (MMR) vaccine. You may need at least one dose of MMR if you were born in 1957 or later. You may also need a second dose.  Pneumococcal 13-valent conjugate (PCV13) vaccine. One dose is recommended after age 65.  Pneumococcal polysaccharide (PPSV23) vaccine. One dose is recommended after age 65.  Meningococcal vaccine. You may need this if you have certain conditions.  Hepatitis A vaccine. You may need this if you have certain conditions or if you travel or work in places where you   may be exposed to hepatitis A.  Hepatitis B vaccine. You may need this if you have certain conditions or if you travel or work in places where you may be exposed to hepatitis B.  Haemophilus influenzae type b (Hib) vaccine. You may need this if you have certain risk factors.  Talk to your health care provider about which screenings and vaccines  you need and how often you need them. This information is not intended to replace advice given to you by your health care provider. Make sure you discuss any questions you have with your health care provider. Document Released: 06/27/2015 Document Revised: 02/18/2016 Document Reviewed: 04/01/2015 Elsevier Interactive Patient Education  2018 Elsevier Inc.  

## 2017-09-01 NOTE — Assessment & Plan Note (Signed)
ghm utd Check labs  See AVS Discussed shingrix with pt

## 2017-09-01 NOTE — Assessment & Plan Note (Signed)
Per cardiology ?On entresto ?

## 2017-09-01 NOTE — Assessment & Plan Note (Signed)
Well controlled, no changes to meds. Encouraged heart healthy diet such as the DASH diet and exercise as tolerated.  °

## 2017-09-01 NOTE — Assessment & Plan Note (Signed)
Encouraged heart healthy diet, increase exercise, avoid trans fats, consider a krill oil cap daily  Pt stopped his statin Recheck labs today

## 2017-09-01 NOTE — Progress Notes (Signed)
Patient ID: Alexander Higgins, male    DOB: Nov 20, 1945  Age: 72 y.o. MRN: [id number]    Subjective:  Subjective  HPI Alexander Higgins presents for cpe --- no complaints.    Review of Systems  Constitutional: Negative.  Negative for appetite change, diaphoresis, fatigue and unexpected weight change.  HENT: Negative for congestion, ear pain, hearing loss, nosebleeds, postnasal drip, rhinorrhea, sinus pressure, sneezing and tinnitus.   Eyes: Negative for photophobia, pain, discharge, redness, itching and visual disturbance.  Respiratory: Negative.  Negative for cough, chest tightness, shortness of breath and wheezing.   Cardiovascular: Negative.  Negative for chest pain, palpitations and leg swelling.  Gastrointestinal: Negative for abdominal distention, abdominal pain, anal bleeding, blood in stool and constipation.  Endocrine: Negative.  Negative for cold intolerance, heat intolerance, polydipsia, polyphagia and polyuria.  Genitourinary: Negative.  Negative for difficulty urinating, dysuria and frequency.  Musculoskeletal: Negative.   Skin: Negative.   Allergic/Immunologic: Negative.   Neurological: Negative for dizziness, weakness, light-headedness, numbness and headaches.  Psychiatric/Behavioral: Negative for agitation, confusion, decreased concentration, dysphoric mood, sleep disturbance and suicidal ideas. The patient is not nervous/anxious.     History Past Medical History:  Diagnosis Date  . AICD (automatic cardioverter/defibrillator) present   . Arthritis    "right knee" (04/23/2015)  . Atrial fibrillation ([place])    a. identified on device check in 07/2016.  Marland Kitchen Chronic combined systolic and diastolic CHF (congestive heart failure) ([place]) dx'd 11/2014   a. 05/2016: echo showing EF of 30-35%, Grade 1 DD, trivial MR, mild TR, PA peak pressure 17 mm Hg.   Marland Kitchen Hyperlipidemia   . Hypertension   . Nonischemic cardiomyopathy ([facility])    a. s/p ICD placement in 04/2015    He has a past  surgical history that includes Tonsillectomy; Insertion of ICD (04/23/2015); and Cardiac catheterization (N/A, 04/23/2015).   His family history includes Heart attack in his brother; Heart disease in his mother; Hypertension in his brother and mother; Stroke in his sister.He reports that he has never smoked. He has never used smokeless tobacco. He reports that he does not drink alcohol or use drugs.  Current Outpatient Medications on File Prior to Visit  Medication Sig Dispense Refill  . apixaban (ELIQUIS) 5 MG TABS tablet Take 1 tablet (5 mg total) by mouth 2 (two) times daily. 180 tablet 1  . furosemide (LASIX) 40 MG tablet Take 1 tablet (40 mg total) by mouth 2 (two) times daily. NEED OV. 180 tablet 0  . sacubitril-valsartan (ENTRESTO) 97-103 MG Take 1 tablet by mouth 2 (two) times daily. KEEP OV. 60 tablet 1   No current facility-administered medications on file prior to visit.      Objective:  Objective  Physical Exam  Constitutional: He is oriented to person, place, and time. He appears well-developed and well-nourished. No distress.  HENT:  Head: Normocephalic and atraumatic.  Right Ear: External ear normal.  Left Ear: External ear normal.  Nose: Nose normal.  Mouth/Throat: Oropharynx is clear and moist. No oropharyngeal exudate.  Eyes: Pupils are equal, round, and reactive to light. Conjunctivae and EOM are normal. Right eye exhibits no discharge. Left eye exhibits no discharge.  Neck: Normal range of motion. Neck supple. No JVD present. No thyromegaly present.  Cardiovascular: Normal rate, regular rhythm and intact distal pulses. Exam reveals no gallop and no friction rub.  No murmur heard. Pulmonary/Chest: Effort normal and breath sounds normal. No respiratory distress. He has no wheezes. He has no rales. He exhibits no tenderness.  Abdominal: Soft. Bowel sounds are normal. He exhibits no distension and no mass. There is no tenderness. There is no rebound and no guarding.    Genitourinary: Rectum normal, prostate normal and penis normal. Rectal exam shows guaiac negative stool. No penile tenderness.  Musculoskeletal: Normal range of motion. He exhibits no edema or tenderness.  Lymphadenopathy:    He has no cervical adenopathy.  Neurological: He is alert and oriented to person, place, and time. He displays normal reflexes. He exhibits normal muscle tone.  Skin: Skin is warm and dry. No rash noted. He is not diaphoretic. No erythema. No pallor.  Psychiatric: He has a normal mood and affect. His behavior is normal. Judgment and thought content normal.  Nursing note and vitals reviewed.  BP 128/82 (BP Location: Right Arm, Cuff Size: Large)   Pulse (!) 56   Temp 97.6 F (36.4 C) (Oral)   Resp 16   Ht 6' (1.829 m)   Wt 290 lb 12.8 oz (131.9 kg)   SpO2 96%   BMI 39.44 kg/m  Wt Readings from Last 3 Encounters:  09/01/17 290 lb 12.8 oz (131.9 kg)  10/07/16 282 lb 9.6 oz (128.2 kg)  09/24/16 286 lb (129.7 kg)     Lab Results  Component Value Date   WBC 6.8 08/01/2015   HGB 15.7 08/01/2015   HCT 45.9 08/01/2015   PLT 186.0 08/01/2015   GLUCOSE 104 (H) 10/07/2016   CHOL 178 02/11/2016   TRIG 103.0 02/11/2016   HDL 39.30 02/11/2016   LDLCALC 118 (H) 02/11/2016   ALT 30 02/11/2016   AST 37 02/11/2016   NA 140 10/07/2016   K 4.3 10/07/2016   CL 106 10/07/2016   CREATININE 1.31 (H) 10/07/2016   BUN 23 10/07/2016   CO2 18 (L) 10/07/2016   TSH 1.84 08/13/2016   PSA 0.48 08/01/2015   INR 1.08 04/21/2015   MICROALBUR 0.7 08/01/2015    Dg Chest 2 View  Result Date: 04/24/2015 CLINICAL DATA:  Pacemaker placement EXAM: CHEST  2 VIEW COMPARISON:  11/28/2014 FINDINGS: Interval placement of left subclavian dual lead AICD pacemaker with leads in the right atrium and right ventricle. Negative for pneumothorax. Cardiac enlargement without heart failure. Mild atelectasis in the lung bases. No pleural effusion. IMPRESSION: Satisfactory AICD placement Mild  bibasilar atelectasis. Electronically Signed   By: Alexander Higgins M.D.   On: 04/24/2015 07:00     Assessment & Plan:  Plan  I have discontinued Alexander Higgins's pravastatin. I am also having him maintain his sacubitril-valsartan, apixaban, furosemide, and carvedilol.  Meds ordered this encounter  Medications  . carvedilol (COREG) 12.5 MG tablet    Sig: Take 1 tablet (12.5 mg total) by mouth 2 (two) times daily.    Dispense:  180 tablet    Refill:  3    Problem List Items Addressed This Visit      Unprioritized   Chronic combined systolic and diastolic CHF (congestive heart failure) ([place])    Per cardiology On entresto      Relevant Medications   carvedilol (COREG) 12.5 MG tablet   Essential hypertension    Well controlled, no changes to meds. Encouraged heart healthy diet such as the DASH diet and exercise as tolerated.       Relevant Medications   carvedilol (COREG) 12.5 MG tablet   Other Relevant Orders   CBC with Differential/Platelet   Comprehensive metabolic panel   Lipid panel   PSA   Hyperlipidemia    Encouraged heart  healthy diet, increase exercise, avoid trans fats, consider a krill oil cap daily  Pt stopped his statin Recheck labs today      Relevant Medications   carvedilol (COREG) 12.5 MG tablet   Paroxysmal atrial fibrillation (HCC)   Relevant Medications   carvedilol (COREG) 12.5 MG tablet   Other Relevant Orders   CBC with Differential/Platelet   Comprehensive metabolic panel   Lipid panel   PSA   Preventative health care - Primary    ghm utd Check labs  See AVS Discussed shingrix with pt        Other Visit Diagnoses    Chronic combined systolic and diastolic heart failure (HCC)       Relevant Medications   carvedilol (COREG) 12.5 MG tablet   Other Relevant Orders   CBC with Differential/Platelet   Comprehensive metabolic panel   Lipid panel   PSA   Hyperlipidemia LDL goal <100       Relevant Medications   carvedilol (COREG) 12.5  MG tablet   Other Relevant Orders   CBC with Differential/Platelet   Comprehensive metabolic panel   Lipid panel   PSA   Need for pneumococcal vaccine       Relevant Orders   Pneumococcal polysaccharide vaccine 23-valent greater than or equal to 2yo subcutaneous/IM (Completed)   Flu vaccine need       Relevant Orders   Flu vaccine HIGH DOSE PF (Fluzone High Dose) (Completed)      Follow-up: Return in about 1 year (around 09/02/2018) for annual exam, fasting.  Ann Held, DO

## 2017-09-02 LAB — CBC WITH DIFFERENTIAL/PLATELET
BASOS ABS: 0.1 10*3/uL (ref 0.0–0.1)
BASOS PCT: 0.9 % (ref 0.0–3.0)
EOS PCT: 2.5 % (ref 0.0–5.0)
Eosinophils Absolute: 0.2 10*3/uL (ref 0.0–0.7)
HEMATOCRIT: 49.4 % (ref 39.0–52.0)
Hemoglobin: 16.9 g/dL (ref 13.0–17.0)
LYMPHS ABS: 2.8 10*3/uL (ref 0.7–4.0)
LYMPHS PCT: 35.9 % (ref 12.0–46.0)
MCHC: 34.2 g/dL (ref 30.0–36.0)
MCV: 91.6 fl (ref 78.0–100.0)
MONOS PCT: 11 % (ref 3.0–12.0)
Monocytes Absolute: 0.9 10*3/uL (ref 0.1–1.0)
NEUTROS ABS: 3.9 10*3/uL (ref 1.4–7.7)
Neutrophils Relative %: 49.7 % (ref 43.0–77.0)
Platelets: 197 10*3/uL (ref 150.0–400.0)
RBC: 5.4 Mil/uL (ref 4.22–5.81)
RDW: 14.3 % (ref 11.5–15.5)
WBC: 7.8 10*3/uL (ref 4.0–10.5)

## 2017-09-02 LAB — COMPREHENSIVE METABOLIC PANEL
ALT: 27 U/L (ref 0–53)
AST: 25 U/L (ref 0–37)
Albumin: 4.1 g/dL (ref 3.5–5.2)
Alkaline Phosphatase: 49 U/L (ref 39–117)
BILIRUBIN TOTAL: 0.6 mg/dL (ref 0.2–1.2)
BUN: 19 mg/dL (ref 6–23)
CALCIUM: 9.3 mg/dL (ref 8.4–10.5)
CO2: 24 mEq/L (ref 19–32)
Chloride: 105 mEq/L (ref 96–112)
Creatinine, Ser: 1.18 mg/dL (ref 0.40–1.50)
GFR: 78.35 mL/min (ref >60.00)
Glucose, Bld: 96 mg/dL (ref 70–99)
POTASSIUM: 4.2 meq/L (ref 3.5–5.1)
Sodium: 141 mEq/L (ref 135–145)
Total Protein: 7.9 g/dL (ref 6.0–8.3)

## 2017-09-02 LAB — LIPID PANEL
CHOL/HDL RATIO: 5
Cholesterol: 233 mg/dL — ABNORMAL HIGH (ref 0–200)
HDL: 44.4 mg/dL (ref >39.00)
LDL Cholesterol: 165 mg/dL — ABNORMAL HIGH (ref 0–99)
NonHDL: 189.05
TRIGLYCERIDES: 119 mg/dL (ref 0.0–149.0)
VLDL: 23.8 mg/dL (ref 0.0–40.0)

## 2017-09-02 LAB — PSA: PSA: 0.44 ng/mL (ref 0.10–4.00)

## 2017-09-09 MED ORDER — ATORVASTATIN CALCIUM 10 MG PO TABS: 10.0000 mg | ORAL_TABLET | Freq: Every day | ORAL | 2 refills | Status: DC

## 2017-09-09 NOTE — Addendum Note (Signed)
Addended by: Bartholome Bill on: 09/09/2017 10:01 AM   Modules accepted: Orders

## 2017-09-26 ENCOUNTER — Ambulatory Visit (INDEPENDENT_AMBULATORY_CARE_PROVIDER_SITE_OTHER): Payer: Medicare Other | Admitting: *Deleted

## 2017-09-26 DIAGNOSIS — I428 Other cardiomyopathies: Secondary | ICD-10-CM | POA: Diagnosis not present

## 2017-09-26 NOTE — Progress Notes (Signed)
Remote ICD transmission.   

## 2017-09-28 ENCOUNTER — Other Ambulatory Visit: Payer: Self-pay | Admitting: Cardiovascular Disease

## 2017-09-28 ENCOUNTER — Other Ambulatory Visit: Payer: Self-pay | Admitting: Student

## 2017-09-28 ENCOUNTER — Encounter: Payer: Self-pay | Admitting: Cardiology

## 2017-09-28 NOTE — Telephone Encounter (Signed)
Rx(s) sent to pharmacy electronically.  

## 2017-10-05 ENCOUNTER — Telehealth: Payer: Self-pay

## 2017-10-05 LAB — CUP PACEART REMOTE DEVICE CHECK
Battery Remaining Longevity: 126 mo
Battery Remaining Percentage: 100 %
Brady Statistic RV Percent Paced: 2 %
HighPow Impedance: 78 Ohm
Implantable Lead Implant Date: 20161109
Implantable Lead Location: 753860
Implantable Lead Serial Number: 710559
Implantable Pulse Generator Implant Date: 20161109
Lead Channel Pacing Threshold Amplitude: 0.8 V
Lead Channel Pacing Threshold Pulse Width: 0.4 ms
Lead Channel Setting Pacing Amplitude: 2.4 V
Lead Channel Setting Pacing Pulse Width: 0.4 ms
MDC IDC LEAD IMPLANT DT: 20161109
MDC IDC LEAD LOCATION: 753859
MDC IDC LEAD SERIAL: 385866
MDC IDC MSMT LEADCHNL RA IMPEDANCE VALUE: 741 Ohm
MDC IDC MSMT LEADCHNL RV IMPEDANCE VALUE: 486 Ohm
MDC IDC MSMT LEADCHNL RV PACING THRESHOLD AMPLITUDE: 0.8 V
MDC IDC MSMT LEADCHNL RV PACING THRESHOLD PULSEWIDTH: 0.4 ms
MDC IDC PG SERIAL: 512157
MDC IDC SESS DTM: 20190415115700
MDC IDC SET LEADCHNL RA PACING AMPLITUDE: 2.4 V
MDC IDC SET LEADCHNL RV SENSING SENSITIVITY: 0.6 mV
MDC IDC STAT BRADY RA PERCENT PACED: 20 %

## 2017-10-05 NOTE — Telephone Encounter (Signed)
Mailed Eliquis patient assistance paperwork to patient for completion.

## 2017-10-14 ENCOUNTER — Other Ambulatory Visit: Payer: Self-pay | Admitting: Cardiovascular Disease

## 2017-10-17 ENCOUNTER — Other Ambulatory Visit: Payer: Self-pay | Admitting: *Deleted

## 2017-10-17 MED ORDER — SACUBITRIL-VALSARTAN 97-103 MG PO TABS: 1.0000 | ORAL_TABLET | Freq: Two times a day (BID) | ORAL | 2 refills | Status: DC

## 2017-10-17 MED ORDER — CARVEDILOL 12.5 MG PO TABS: 12.5000 mg | ORAL_TABLET | Freq: Two times a day (BID) | ORAL | 0 refills | Status: DC

## 2017-10-17 NOTE — Telephone Encounter (Signed)
REFILL 

## 2017-10-24 ENCOUNTER — Encounter: Payer: Self-pay | Admitting: Cardiovascular Disease

## 2017-10-24 ENCOUNTER — Ambulatory Visit: Payer: Medicare Other | Admitting: Cardiovascular Disease

## 2017-10-24 VITALS — BP 120/80 | HR 77 | Ht 72.0 in | Wt 292.0 lb

## 2017-10-24 DIAGNOSIS — I480 Paroxysmal atrial fibrillation: Secondary | ICD-10-CM | POA: Diagnosis not present

## 2017-10-24 DIAGNOSIS — Z95810 Presence of automatic (implantable) cardiac defibrillator: Secondary | ICD-10-CM

## 2017-10-24 DIAGNOSIS — I5042 Chronic combined systolic (congestive) and diastolic (congestive) heart failure: Secondary | ICD-10-CM | POA: Diagnosis not present

## 2017-10-24 DIAGNOSIS — I10 Essential (primary) hypertension: Secondary | ICD-10-CM | POA: Diagnosis not present

## 2017-10-24 DIAGNOSIS — Z7901 Long term (current) use of anticoagulants: Secondary | ICD-10-CM | POA: Diagnosis not present

## 2017-10-24 MED ORDER — APIXABAN 5 MG PO TABS: 5.0000 mg | ORAL_TABLET | Freq: Two times a day (BID) | ORAL | 3 refills | Status: DC

## 2017-10-24 NOTE — Progress Notes (Signed)
Patient ID: Alexander Higgins, male   DOB: [birth year], 72 y.o.   MRN: [id number]     Cardiology Office Note    Date:  10/25/2017   ID:  Alexander Higgins, DOB [birth year], MRN [id number]  PCP:  Carollee Herter, Alferd Apa, DO  Cardiologist:   Quay Burow, M.D.; Sanda Klein, MD   Chief complaint:  Follow-up ICD, atrial fibrillation   History of Present Illness:  Alexander Higgins is a 72 y.o. male , roughly 2.5 years status post implantation of a [employer] dual-chamber defibrillator for severe nonischemic cardiomyopathy with a left ventricular ejection fraction of approximately 30%  and symptomatic sinus bradycardia.  He continues to feel very well.  He goes to the gym 5 days a week for he exercises between 1 to 2 hours every day he has not managed to lose some weight, but his BMI overestimates the degree of obesity since he is very muscular.  His blood pressure control has been good.  He is on maximum dose of Entresto and a good dose of carvedilol, a relatively modest dose of diuretic.Marland Kitchen  He is on oral anticoagulants for atrial fibrillation.  Interrogation of his defibrillator shows normal function with an estimated generator longevity of 10.5 years. There have been no episodes of treated ventricular tachycardia or ventricular fibrillation. He has had 22 episodes of nonsustained ventricular tachycardia, all of them very brief. Some episodes of ventricular tachycardia in the monitor zone are atrial or sinus tachycardia.  He has 20% atrial pacing and only 1% ventricular pacing. Occasional episodes of paroxysmal atrial tachycardia have also been recorded. A 2 hour episode of atrial fibrillation occurred 3 months ago, none since. Tolerating oral anticoagulants without complications.  The patient specifically denies any chest pain at rest exertion, dyspnea at rest or with exertion, orthopnea, paroxysmal nocturnal dyspnea, syncope, palpitations, focal neurological deficits, intermittent claudication,  lower extremity edema, unexplained weight gain, cough, hemoptysis or wheezing.    Past Medical History:  Diagnosis Date  . AICD (automatic cardioverter/defibrillator) present   . Arthritis    "right knee" (04/23/2015)  . Atrial fibrillation ([facility])    a. identified on device check in 07/2016.  Marland Kitchen Chronic combined systolic and diastolic CHF (congestive heart failure) ([facility]) dx'd 11/2014   a. 05/2016: echo showing EF of 30-35%, Grade 1 DD, trivial MR, mild TR, PA peak pressure 17 mm Hg.   Marland Kitchen Hyperlipidemia   . Hypertension   . Nonischemic cardiomyopathy ([facility])    a. s/p ICD placement in 04/2015    Past Surgical History:  Procedure Laterality Date  . EP IMPLANTABLE DEVICE N/A 04/23/2015   Procedure: ICD Implant;  Surgeon: Sanda Klein, MD;  Location: [facility];  Service: Cardiovascular;  Laterality: N/A;  . INSERTION OF ICD  04/23/2015  . TONSILLECTOMY      Current Outpatient Medications  Medication Sig Dispense Refill  . apixaban (ELIQUIS) 5 MG TABS tablet Take 1 tablet (5 mg total) by mouth 2 (two) times daily. 180 tablet 3  . atorvastatin (LIPITOR) 10 MG tablet Take 1 tablet (10 mg total) by mouth daily. 30 tablet 2  . carvedilol (COREG) 12.5 MG tablet Take 1 tablet (12.5 mg total) by mouth 2 (two) times daily with a meal. KEEP OV. 180 tablet 0  . furosemide (LASIX) 40 MG tablet Take 1 tablet (40 mg total) by mouth 2 (two) times daily. NEED OV. 180 tablet 0  . sacubitril-valsartan (ENTRESTO) 97-103 MG Take 1 tablet by mouth 2 (two) times daily. KEEP OV.  180 tablet 2   No current facility-administered medications for this visit.     Allergies:   Patient has no known allergies.   Social History   Socioeconomic History  . Marital status: Divorced    Spouse name: Not on file  . Number of children: Not on file  . Years of education: Not on file  . Highest education level: Not on file  Occupational History  . Not on file  Social Needs  . Financial resource strain: Not on  file  . Food insecurity:    Worry: Not on file    Inability: Not on file  . Transportation needs:    Medical: Not on file    Non-medical: Not on file  Tobacco Use  . Smoking status: Never Smoker  . Smokeless tobacco: Never Used  Substance and Sexual Activity  . Alcohol use: No    Alcohol/week: 0.0 oz  . Drug use: No  . Sexual activity: Yes  Lifestyle  . Physical activity:    Days per week: Not on file    Minutes per session: Not on file  . Stress: Not on file  Relationships  . Social connections:    Talks on phone: Not on file    Gets together: Not on file    Attends religious service: Not on file    Active member of club or organization: Not on file    Attends meetings of clubs or organizations: Not on file    Relationship status: Not on file  Other Topics Concern  . Not on file  Social History Narrative   Exercise in gym every day     Family History:  The patient's family history includes Heart attack in his brother; Heart disease in his mother; Hypertension in his brother and mother; Stroke in his sister.   ROS:   Please see the history of present illness.    ROS All other systems reviewed and are negative.   PHYSICAL EXAM:   VS:  BP 120/80   Pulse 77   Ht 6' (1.829 m)   Wt 292 lb (132.5 kg)   BMI 39.60 kg/m     General: Alert, oriented x3, no distress, obese but also appears very strongly muscular Head: no evidence of trauma, PERRL, EOMI, no exophtalmos or lid lag, no myxedema, no xanthelasma; normal ears, nose and oropharynx Neck: normal jugular venous pulsations and no hepatojugular reflux; brisk carotid pulses without delay and no carotid bruits Chest: clear to auscultation, no signs of consolidation by percussion or palpation, normal fremitus, symmetrical and full respiratory excursions Cardiovascular: normal position and quality of the apical impulse, regular rhythm, normal first and second heart sounds, no murmurs, rubs or gallops Abdomen: no  tenderness or distention, no masses by palpation, no abnormal pulsatility or arterial bruits, normal bowel sounds, no hepatosplenomegaly Extremities: no clubbing, cyanosis or edema; 2+ radial, ulnar and brachial pulses bilaterally; 2+ right femoral, posterior tibial and dorsalis pedis pulses; 2+ left femoral, posterior tibial and dorsalis pedis pulses; no subclavian or femoral bruits Neurological: grossly nonfocal Psych: Normal mood and affect   Wt Readings from Last 3 Encounters:  10/24/17 292 lb (132.5 kg)  09/01/17 290 lb 12.8 oz (131.9 kg)  10/07/16 282 lb 9.6 oz (128.2 kg)      Studies/Labs Reviewed:   EKG:  EKG is ordered today.  He shows sinus rhythm with PACs and occasional atrial pacing due to post-PVC pauses, otherwise normal tracing, QTC 427 ms  Recent Labs: 09/01/2017: ALT  27; BUN 19; Creatinine, Ser 1.18; Hemoglobin 16.9; Platelets 197.0; Potassium 4.2; Sodium 141   Lipid Panel    Component Value Date/Time   CHOL 233 (H) 09/01/2017 1453   TRIG 119.0 09/01/2017 1453   HDL 44.40 09/01/2017 1453   CHOLHDL 5 09/01/2017 1453   VLDL 23.8 09/01/2017 1453   LDLCALC 165 (H) 09/01/2017 1453      ASSESSMENT:    1. Chronic combined systolic and diastolic CHF (congestive heart failure) ([facility])   2. ICD (implantable cardioverter-defibrillator) in place   3. Paroxysmal atrial fibrillation ([facility])   4. Long term (current) use of anticoagulants   5. Essential hypertension      PLAN:  In order of problems listed above:  1.  CHF: Nonischemic cardiomyopathy.  Excellent functional status. EF 30-35%. On Entresto and maximum dose and carvedilol and moderate to high dose.  Appears clinically euvolemic. 2. ICD: Normal device function.  Occasional nonsustained VT not requiring therapy.  Continue remote downloads every 3 months. 3. Afib: Very low burden of arrhythmia, asymptomatic. CHADSVasc 3 (age, HF, HTN), on anticoagulant. 4. Eliquis: Well-tolerated, no bleeding  complications 5. HTN: Very well controlled   Medication Adjustments/Labs and Tests Ordered: Current medicines are reviewed at length with the patient today.  Concerns regarding medicines are outlined above.  Medication changes, Labs and Tests ordered today are listed below. Patient Instructions  Dr Sallyanne Kuster recommends that you continue on your current medications as directed. Please refer to the Current Medication list given to you today.  Remote monitoring is used to monitor your Pacemaker or ICD from home. This monitoring reduces the number of office visits required to check your device to one time per year. It allows [place] to keep an eye on the functioning of your device to ensure it is working properly. You are scheduled for a device check from home on Monday, July 15th, 2019. You may send your transmission at any time that day. If you have a wireless device, the transmission will be sent automatically. After your physician reviews your transmission, you will receive a notification with your next transmission date.  To improve our patient care and to more adequately follow your device, [facility] has decided, as a practice, to start following each patient four times a year with your home monitor. This means that you may experience a remote appointment that is close to an in-office appointment with your physician. Your insurance will apply at the same rate as other remote monitoring transmissions.  Dr Sallyanne Kuster recommends that you schedule a follow-up appointment in 12 months with an ICD check. You will receive a reminder letter in the mail two months in advance. If you don't receive a letter, please call our office to schedule the follow-up appointment.  If you need a refill on your cardiac medications before your next appointment, please call your pharmacy.     Signed, Sanda Klein, MD  10/25/2017 3:54 PM    [address], [place], [place]   [address] Phone: [phone]; Fax: [phone]

## 2017-10-24 NOTE — Patient Instructions (Signed)
Dr Sallyanne Kuster recommends that you continue on your current medications as directed. Please refer to the Current Medication list given to you today.  Remote monitoring is used to monitor your Pacemaker or ICD from home. This monitoring reduces the number of office visits required to check your device to one time per year. It allows [place] to keep an eye on the functioning of your device to ensure it is working properly. You are scheduled for a device check from home on Monday, July 15th, 2019. You may send your transmission at any time that day. If you have a wireless device, the transmission will be sent automatically. After your physician reviews your transmission, you will receive a notification with your next transmission date.  To improve our patient care and to more adequately follow your device, [facility] has decided, as a practice, to start following each patient four times a year with your home monitor. This means that you may experience a remote appointment that is close to an in-office appointment with your physician. Your insurance will apply at the same rate as other remote monitoring transmissions.  Dr Sallyanne Kuster recommends that you schedule a follow-up appointment in 12 months with an ICD check. You will receive a reminder letter in the mail two months in advance. If you don't receive a letter, please call our office to schedule the follow-up appointment.  If you need a refill on your cardiac medications before your next appointment, please call your pharmacy.

## 2017-11-13 ENCOUNTER — Other Ambulatory Visit: Payer: Self-pay | Admitting: Student

## 2017-12-26 ENCOUNTER — Ambulatory Visit (INDEPENDENT_AMBULATORY_CARE_PROVIDER_SITE_OTHER): Payer: Medicare Other | Admitting: *Deleted

## 2017-12-26 DIAGNOSIS — I428 Other cardiomyopathies: Secondary | ICD-10-CM | POA: Diagnosis not present

## 2017-12-26 NOTE — Progress Notes (Signed)
Remote ICD transmission.   

## 2017-12-27 LAB — CUP PACEART REMOTE DEVICE CHECK
Date Time Interrogation Session: 20190713194400
HIGH POWER IMPEDANCE MEASURED VALUE: 72 Ohm
Implantable Lead Implant Date: 20161109
Implantable Lead Implant Date: 20161109
Implantable Lead Location: 753859
Implantable Lead Location: 753860
Implantable Lead Serial Number: 385866
Implantable Pulse Generator Implant Date: 20161109
Lead Channel Impedance Value: 502 Ohm
Lead Channel Pacing Threshold Amplitude: 0.8 V
Lead Channel Pacing Threshold Pulse Width: 0.4 ms
Lead Channel Pacing Threshold Pulse Width: 0.4 ms
Lead Channel Setting Pacing Pulse Width: 0.4 ms
Lead Channel Setting Sensing Sensitivity: 0.6 mV
MDC IDC LEAD SERIAL: 710559
MDC IDC MSMT BATTERY REMAINING LONGEVITY: 126 mo
MDC IDC MSMT BATTERY REMAINING PERCENTAGE: 100 %
MDC IDC MSMT LEADCHNL RA IMPEDANCE VALUE: 703 Ohm
MDC IDC MSMT LEADCHNL RA PACING THRESHOLD AMPLITUDE: 0.8 V
MDC IDC SET LEADCHNL RA PACING AMPLITUDE: 2.4 V
MDC IDC SET LEADCHNL RV PACING AMPLITUDE: 2.4 V
MDC IDC STAT BRADY RA PERCENT PACED: 19 %
MDC IDC STAT BRADY RV PERCENT PACED: 3 %
Pulse Gen Serial Number: 512157

## 2017-12-28 ENCOUNTER — Encounter: Payer: Self-pay | Admitting: Cardiology

## 2018-01-31 ENCOUNTER — Other Ambulatory Visit: Payer: Self-pay | Admitting: Cardiovascular Disease

## 2018-02-01 NOTE — Telephone Encounter (Signed)
Rx sent to pharmacy   

## 2018-02-03 ENCOUNTER — Telehealth: Payer: Self-pay | Admitting: Cardiovascular Disease

## 2018-02-03 NOTE — Telephone Encounter (Signed)
New Message         *STAT* If patient is at the pharmacy, call can be transferred to refill team.   1. Which medications need to be refilled? (please list name of each medication and dose if known) Entresto   2. Which pharmacy/location (including street and city if local pharmacy) is medication to be sent to? [place]  3. Do they need a 30 day or 90 day supply? 90  Patient is out x 3 days

## 2018-02-03 NOTE — Telephone Encounter (Signed)
Spoke to pt and advised that a prescription was sent in on 8/21 to [facility] in [place]. Pt verbalized understanding.

## 2018-02-22 ENCOUNTER — Other Ambulatory Visit: Payer: Self-pay | Admitting: Cardiovascular Disease

## 2018-03-03 ENCOUNTER — Other Ambulatory Visit: Payer: Self-pay | Admitting: Cardiovascular Disease

## 2018-03-27 ENCOUNTER — Ambulatory Visit (INDEPENDENT_AMBULATORY_CARE_PROVIDER_SITE_OTHER): Payer: Medicare Other | Admitting: *Deleted

## 2018-03-27 DIAGNOSIS — I428 Other cardiomyopathies: Secondary | ICD-10-CM

## 2018-03-28 NOTE — Progress Notes (Signed)
Remote ICD transmission.   

## 2018-03-30 ENCOUNTER — Encounter: Payer: Self-pay | Admitting: Cardiology

## 2018-04-19 LAB — CUP PACEART REMOTE DEVICE CHECK
Date Time Interrogation Session: 20191106130137
Implantable Lead Implant Date: 20161109
Implantable Lead Location: 753859
Implantable Lead Model: 293
Implantable Lead Serial Number: 385866
Implantable Lead Serial Number: 710559
MDC IDC LEAD IMPLANT DT: 20161109
MDC IDC LEAD LOCATION: 753860
MDC IDC PG IMPLANT DT: 20161109
MDC IDC PG SERIAL: 512157

## 2018-05-06 ENCOUNTER — Other Ambulatory Visit: Payer: Self-pay | Admitting: Cardiovascular Disease

## 2018-05-08 ENCOUNTER — Other Ambulatory Visit: Payer: Self-pay

## 2018-05-08 MED ORDER — CARVEDILOL 12.5 MG PO TABS: ORAL_TABLET | ORAL | 3 refills | Status: DC

## 2018-05-08 NOTE — Telephone Encounter (Signed)
Rx request sent to pharmacy.  

## 2018-05-18 ENCOUNTER — Other Ambulatory Visit: Payer: Self-pay | Admitting: Cardiovascular Disease

## 2018-06-27 ENCOUNTER — Telehealth: Payer: Self-pay | Admitting: Cardiovascular Disease

## 2018-06-27 ENCOUNTER — Other Ambulatory Visit: Payer: Self-pay

## 2018-06-27 MED ORDER — APIXABAN 5 MG PO TABS: 5.0000 mg | ORAL_TABLET | Freq: Two times a day (BID) | ORAL | 1 refills | Status: DC

## 2018-06-27 NOTE — Telephone Encounter (Signed)
Pt calling requesting a refill on Eliquis sent to [facility] in [facility] on [address]. Please address

## 2018-06-27 NOTE — Telephone Encounter (Signed)
°*  STAT* If patient is at the pharmacy, call can be transferred to refill team.   1. Which medications need to be refilled? (please list name of each medication and dose if know New prescription for Eliquis  2. Which pharmacy/location (including street and city if Management consultant) [facility] [place] 3. Do they need a 30 day or 90 day supply? 60 and refills

## 2018-06-28 ENCOUNTER — Other Ambulatory Visit: Payer: Self-pay

## 2018-06-28 ENCOUNTER — Telehealth: Payer: Self-pay

## 2018-06-28 MED ORDER — APIXABAN 5 MG PO TABS: 5.0000 mg | ORAL_TABLET | Freq: Two times a day (BID) | ORAL | 1 refills | Status: DC

## 2018-06-28 MED ORDER — SACUBITRIL-VALSARTAN 97-103 MG PO TABS: 1.0000 | ORAL_TABLET | Freq: Two times a day (BID) | ORAL | 1 refills | Status: DC

## 2018-06-28 NOTE — Telephone Encounter (Signed)
F/U Message        Patient  Is calling checking status on his medication, Patient states he has being trying to get a refill for two days now. Pls call and advise.    *STAT* If patient is at the pharmacy, call can be transferred to refill team.   1. Which medications need to be refilled? (please list name of each medication and dose if known) Eliquis 5 mg/Entrusto 97-103mg   2. Which pharmacy/location (including street and city if local pharmacy) is medication to be sent to? [facility] [address]  3. Do they need a 30 day or 90 day supply? [place]

## 2018-06-28 NOTE — Telephone Encounter (Signed)
Left message for patient to remind of missed remote transmission.  

## 2018-06-30 ENCOUNTER — Encounter: Payer: Self-pay | Admitting: Cardiology

## 2018-07-03 ENCOUNTER — Ambulatory Visit (INDEPENDENT_AMBULATORY_CARE_PROVIDER_SITE_OTHER): Payer: Medicare PPO

## 2018-07-03 DIAGNOSIS — I5042 Chronic combined systolic (congestive) and diastolic (congestive) heart failure: Secondary | ICD-10-CM

## 2018-07-03 DIAGNOSIS — I428 Other cardiomyopathies: Secondary | ICD-10-CM | POA: Diagnosis not present

## 2018-07-04 NOTE — Progress Notes (Signed)
Remote ICD transmission.   

## 2018-07-06 LAB — CUP PACEART REMOTE DEVICE CHECK
Battery Remaining Percentage: 100 %
Brady Statistic RA Percent Paced: 18 %
Brady Statistic RV Percent Paced: 4 %
Date Time Interrogation Session: 20200119233400
HighPow Impedance: 77 Ohm
Implantable Lead Implant Date: 20161109
Implantable Lead Implant Date: 20161109
Implantable Lead Location: 753859
Implantable Lead Location: 753860
Implantable Lead Model: 293
Implantable Lead Model: 7741
Implantable Lead Serial Number: 385866
Implantable Pulse Generator Implant Date: 20161109
Lead Channel Impedance Value: 492 Ohm
Lead Channel Impedance Value: 763 Ohm
Lead Channel Pacing Threshold Amplitude: 0.8 V
Lead Channel Pacing Threshold Pulse Width: 0.4 ms
Lead Channel Pacing Threshold Pulse Width: 0.4 ms
Lead Channel Setting Pacing Amplitude: 2.4 V
Lead Channel Setting Pacing Amplitude: 2.4 V
Lead Channel Setting Sensing Sensitivity: 0.6 mV
MDC IDC LEAD SERIAL: 710559
MDC IDC MSMT BATTERY REMAINING LONGEVITY: 126 mo
MDC IDC MSMT LEADCHNL RA PACING THRESHOLD AMPLITUDE: 0.8 V
MDC IDC SET LEADCHNL RV PACING PULSEWIDTH: 0.4 ms
Pulse Gen Serial Number: 512157

## 2018-08-04 ENCOUNTER — Telehealth: Payer: Self-pay | Admitting: Cardiovascular Disease

## 2018-08-04 NOTE — Telephone Encounter (Signed)
F/U Message        Patient's daughter is at [facility] waiting for a preauth, to be sent over.

## 2018-08-04 NOTE — Telephone Encounter (Signed)
New Message   Pt c/o medication issue:  1. Name of Medication: sacubitril-valsartan (ENTRESTO) 97-103 MG    2. How are you currently taking this medication (dosage and times per day)? Take 1 tablet by mouth 2 (two) times daily.  3. Are you having a reaction (difficulty breathing--STAT)?   4. What is your medication issue? Patient is calling because Mcarthur Rossetti is requesting a authorization for him to be able to take the [facility]. They should be sending a fax as well.

## 2018-08-04 NOTE — Telephone Encounter (Signed)
Rip Harbour, LPN called insurance, started a new PA through [employer], as they did show a current one was on file, but allowed [place] to do a new one. It went through review, we then got notification from patient daughter that the medication went through from the pharmacy and they were able to get the medication.

## 2018-08-04 NOTE — Telephone Encounter (Signed)
Called patient, he states that he is always having issues with this medication, and he is questioning whether he can use something else, since he continues to to have issues with getting the medication.  I would route to Dr.C and nurse to make aware.

## 2018-08-04 NOTE — Telephone Encounter (Signed)
Spoke with daughter and patient will continue Entresto. Followed up with insurance and PA good until 2039. Advised pharmacist as she said still saying needs PA when runs through. She will reach out to insurance company and call back if any further issues.

## 2018-08-24 ENCOUNTER — Telehealth: Payer: Self-pay | Admitting: *Deleted

## 2018-08-24 NOTE — Telephone Encounter (Signed)
Copied from [place] [phone]. Topic: Referral - Question >> Aug 24, 2018 11:33 AM Vernona Rieger wrote: Reason for CRM: Patient states that Dr Carollee Herter referred him to a neurologist about one year ago - nothing under referrals. Patient is trying to contact this neurologist. Can someone give this patient a call?

## 2018-08-27 ENCOUNTER — Other Ambulatory Visit: Payer: Self-pay | Admitting: Cardiovascular Disease

## 2018-08-29 NOTE — Telephone Encounter (Signed)
Patient stated that he had stuff taken care of.  It was for urology not neuro.

## 2018-10-02 ENCOUNTER — Encounter: Payer: Medicare PPO | Admitting: *Deleted

## 2018-10-02 ENCOUNTER — Other Ambulatory Visit: Payer: Self-pay

## 2018-10-03 ENCOUNTER — Telehealth: Payer: Self-pay

## 2018-10-03 NOTE — Telephone Encounter (Signed)
Unable to speak  with patient to remind of missed remote transmission 

## 2018-10-09 ENCOUNTER — Other Ambulatory Visit: Payer: Self-pay

## 2018-10-09 ENCOUNTER — Ambulatory Visit (INDEPENDENT_AMBULATORY_CARE_PROVIDER_SITE_OTHER): Payer: Medicare PPO | Admitting: *Deleted

## 2018-10-09 DIAGNOSIS — I428 Other cardiomyopathies: Secondary | ICD-10-CM | POA: Diagnosis not present

## 2018-10-09 DIAGNOSIS — I5042 Chronic combined systolic (congestive) and diastolic (congestive) heart failure: Secondary | ICD-10-CM

## 2018-10-10 LAB — CUP PACEART REMOTE DEVICE CHECK
Battery Remaining Longevity: 126 mo
Battery Remaining Percentage: 100 %
Brady Statistic RA Percent Paced: 17 %
Brady Statistic RV Percent Paced: 4 %
Date Time Interrogation Session: 20200426112800
HighPow Impedance: 74 Ohm
Implantable Lead Implant Date: 20161109
Implantable Lead Implant Date: 20161109
Implantable Lead Location: 753859
Implantable Lead Location: 753860
Implantable Lead Model: 293
Implantable Lead Model: 7741
Implantable Lead Serial Number: 385866
Implantable Lead Serial Number: 710559
Implantable Pulse Generator Implant Date: 20161109
Lead Channel Impedance Value: 451 Ohm
Lead Channel Impedance Value: 757 Ohm
Lead Channel Pacing Threshold Amplitude: 0.8 V
Lead Channel Pacing Threshold Amplitude: 0.8 V
Lead Channel Pacing Threshold Pulse Width: 0.4 ms
Lead Channel Pacing Threshold Pulse Width: 0.4 ms
Lead Channel Setting Pacing Amplitude: 2.4 V
Lead Channel Setting Pacing Amplitude: 2.4 V
Lead Channel Setting Pacing Pulse Width: 0.4 ms
Lead Channel Setting Sensing Sensitivity: 0.6 mV
Pulse Gen Serial Number: 512157

## 2018-10-17 NOTE — Progress Notes (Signed)
Remote ICD transmission.   

## 2018-11-19 ENCOUNTER — Other Ambulatory Visit: Payer: Self-pay | Admitting: Cardiovascular Disease

## 2018-12-29 ENCOUNTER — Other Ambulatory Visit: Payer: Self-pay | Admitting: Cardiovascular Disease

## 2019-01-08 ENCOUNTER — Ambulatory Visit (INDEPENDENT_AMBULATORY_CARE_PROVIDER_SITE_OTHER): Payer: Medicare PPO | Admitting: *Deleted

## 2019-01-08 DIAGNOSIS — I428 Other cardiomyopathies: Secondary | ICD-10-CM

## 2019-01-08 DIAGNOSIS — I480 Paroxysmal atrial fibrillation: Secondary | ICD-10-CM | POA: Diagnosis not present

## 2019-01-09 LAB — CUP PACEART REMOTE DEVICE CHECK
Battery Remaining Longevity: 126 mo
Battery Remaining Percentage: 100 %
Brady Statistic RA Percent Paced: 17 %
Brady Statistic RV Percent Paced: 4 %
Date Time Interrogation Session: 20200727131700
HighPow Impedance: 77 Ohm
Implantable Lead Implant Date: 20161109
Implantable Lead Implant Date: 20161109
Implantable Lead Location: 753859
Implantable Lead Location: 753860
Implantable Lead Model: 293
Implantable Lead Model: 7741
Implantable Lead Serial Number: 385866
Implantable Lead Serial Number: 710559
Implantable Pulse Generator Implant Date: 20161109
Lead Channel Impedance Value: 530 Ohm
Lead Channel Impedance Value: 750 Ohm
Lead Channel Pacing Threshold Amplitude: 0.8 V
Lead Channel Pacing Threshold Amplitude: 0.8 V
Lead Channel Pacing Threshold Pulse Width: 0.4 ms
Lead Channel Pacing Threshold Pulse Width: 0.4 ms
Lead Channel Setting Pacing Amplitude: 2.4 V
Lead Channel Setting Pacing Amplitude: 2.4 V
Lead Channel Setting Pacing Pulse Width: 0.4 ms
Lead Channel Setting Sensing Sensitivity: 0.6 mV
Pulse Gen Serial Number: 512157

## 2019-01-19 ENCOUNTER — Other Ambulatory Visit: Payer: Self-pay | Admitting: Cardiovascular Disease

## 2019-01-23 NOTE — Progress Notes (Signed)
Remote ICD transmission.   

## 2019-02-08 ENCOUNTER — Other Ambulatory Visit: Payer: Self-pay | Admitting: Cardiovascular Disease

## 2019-02-14 ENCOUNTER — Encounter: Payer: Self-pay | Admitting: Family Medicine

## 2019-02-14 NOTE — Telephone Encounter (Signed)
Patient has medicare.

## 2019-02-15 ENCOUNTER — Other Ambulatory Visit: Payer: Self-pay

## 2019-02-15 ENCOUNTER — Ambulatory Visit (INDEPENDENT_AMBULATORY_CARE_PROVIDER_SITE_OTHER): Payer: Medicare PPO | Admitting: Family Medicine

## 2019-02-15 ENCOUNTER — Encounter: Payer: Self-pay | Admitting: Family Medicine

## 2019-02-15 VITALS — BP 122/80 | HR 77 | Temp 97.6°F | Resp 18 | Ht 72.0 in | Wt 300.8 lb

## 2019-02-15 DIAGNOSIS — Z0000 Encounter for general adult medical examination without abnormal findings: Secondary | ICD-10-CM

## 2019-02-15 DIAGNOSIS — R140 Abdominal distension (gaseous): Secondary | ICD-10-CM

## 2019-02-15 DIAGNOSIS — R351 Nocturia: Secondary | ICD-10-CM

## 2019-02-15 DIAGNOSIS — Z95810 Presence of automatic (implantable) cardiac defibrillator: Secondary | ICD-10-CM

## 2019-02-15 DIAGNOSIS — I10 Essential (primary) hypertension: Secondary | ICD-10-CM | POA: Diagnosis not present

## 2019-02-15 DIAGNOSIS — E785 Hyperlipidemia, unspecified: Secondary | ICD-10-CM

## 2019-02-15 DIAGNOSIS — I5042 Chronic combined systolic (congestive) and diastolic (congestive) heart failure: Secondary | ICD-10-CM | POA: Diagnosis not present

## 2019-02-15 DIAGNOSIS — I480 Paroxysmal atrial fibrillation: Secondary | ICD-10-CM

## 2019-02-15 DIAGNOSIS — Z23 Encounter for immunization: Secondary | ICD-10-CM | POA: Diagnosis not present

## 2019-02-15 NOTE — Assessment & Plan Note (Signed)
On eliquis---  Pt given phone number for pt assistance for this med as well

## 2019-02-15 NOTE — Assessment & Plan Note (Signed)
Tolerating statin, encouraged heart healthy diet, avoid trans fats, minimize simple carbs and saturated fats. Increase exercise as tolerated 

## 2019-02-15 NOTE — Telephone Encounter (Signed)
They can talk to cardiology ---- see if they have samples ,   They can also go to the drug company's website and see if they qualify for pt assistance They have to fill out their part and cardilogy would need to fill out the other part since they are the prescribing physician

## 2019-02-15 NOTE — Assessment & Plan Note (Signed)
Pt is on eliquis 

## 2019-02-15 NOTE — Patient Instructions (Signed)
Preventive Care 72 Years and Older, Male Preventive care refers to lifestyle choices and visits with your health care provider that can promote health and wellness. This includes:  A yearly physical exam. This is also called an annual well check.  Regular dental and eye exams.  Immunizations.  Screening for certain conditions.  Healthy lifestyle choices, such as diet and exercise. What can I expect for my preventive care visit? Physical exam Your health care provider will check:  Height and weight. These may be used to calculate body mass index (BMI), which is a measurement that tells if you are at a healthy weight.  Heart rate and blood pressure.  Your skin for abnormal spots. Counseling Your health care provider may ask you questions about:  Alcohol, tobacco, and drug use.  Emotional well-being.  Home and relationship well-being.  Sexual activity.  Eating habits.  History of falls.  Memory and ability to understand (cognition).  Work and work Statistician. What immunizations do I need?  Influenza (flu) vaccine  This is recommended every year. Tetanus, diphtheria, and pertussis (Tdap) vaccine  You may need a Td booster every 10 years. Varicella (chickenpox) vaccine  You may need this vaccine if you have not already been vaccinated. Zoster (shingles) vaccine  You may need this after age 50. Pneumococcal conjugate (PCV13) vaccine  One dose is recommended after age 24. Pneumococcal polysaccharide (PPSV23) vaccine  One dose is recommended after age 33. Measles, mumps, and rubella (MMR) vaccine  You may need at least one dose of MMR if you were born in 1957 or later. You may also need a second dose. Meningococcal conjugate (MenACWY) vaccine  You may need this if you have certain conditions. Hepatitis A vaccine  You may need this if you have certain conditions or if you travel or work in places where you may be exposed to hepatitis A. Hepatitis B vaccine   You may need this if you have certain conditions or if you travel or work in places where you may be exposed to hepatitis B. Haemophilus influenzae type b (Hib) vaccine  You may need this if you have certain conditions. You may receive vaccines as individual doses or as more than one vaccine together in one shot (combination vaccines). Talk with your health care provider about the risks and benefits of combination vaccines. What tests do I need? Blood tests  Lipid and cholesterol levels. These may be checked every 5 years, or more frequently depending on your overall health.  Hepatitis C test.  Hepatitis B test. Screening  Lung cancer screening. You may have this screening every year starting at age 72 if you have a 30-pack-year history of smoking and currently smoke or have quit within the past 15 years.  Colorectal cancer screening. All adults should have this screening starting at age 72 and continuing until age 54. Your health care provider may recommend screening at age 47 if you are at increased risk. You will have tests every 1-10 years, depending on your results and the type of screening test.  Prostate cancer screening. Recommendations will vary depending on your family history and other risks.  Diabetes screening. This is done by checking your blood sugar (glucose) after you have not eaten for a while (fasting). You may have this done every 1-3 years.  Abdominal aortic aneurysm (AAA) screening. You may need this if you are a current or former smoker.  Sexually transmitted disease (STD) testing. Follow these instructions at home: Eating and drinking  Eat  a diet that includes fresh fruits and vegetables, whole grains, lean protein, and low-fat dairy products. Limit your intake of foods with high amounts of sugar, saturated fats, and salt.  Take vitamin and mineral supplements as recommended by your health care provider.  Do not drink alcohol if your health care provider  tells you not to drink.  If you drink alcohol: ? Limit how much you have to 0-2 drinks a day. ? Be aware of how much alcohol is in your drink. In the [place] one drink equals one 12 oz bottle of beer (355 mL), one 5 oz glass of wine (148 mL), or one 1 oz glass of hard liquor (44 mL). Lifestyle  Take daily care of your teeth and gums.  Stay active. Exercise for at least 30 minutes on 5 or more days each week.  Do not use any products that contain nicotine or tobacco, such as cigarettes, e-cigarettes, and chewing tobacco. If you need help quitting, ask your health care provider.  If you are sexually active, practice safe sex. Use a condom or other form of protection to prevent STIs (sexually transmitted infections).  Talk with your health care provider about taking a low-dose aspirin or statin. What's next?  Visit your health care provider once a year for a well check visit.  Ask your health care provider how often you should have your eyes and teeth checked.  Stay up to date on all vaccines. This information is not intended to replace advice given to you by your health care provider. Make sure you discuss any questions you have with your health care provider. Document Released: 06/27/2015 Document Revised: 05/25/2018 Document Reviewed: 05/25/2018 Elsevier Patient Education  2020 Elsevier Inc.  

## 2019-02-15 NOTE — Assessment & Plan Note (Signed)
Well controlled, no changes to meds. Encouraged heart healthy diet such as the DASH diet and exercise as tolerated.  °

## 2019-02-15 NOTE — Assessment & Plan Note (Signed)
Per cardiology On entresto Gave the pt pt assistance forms for [employer] ---- to see if he can get the med from the co

## 2019-02-15 NOTE — Assessment & Plan Note (Signed)
ghm utd Flu shot given See Avs Check labs

## 2019-02-15 NOTE — Progress Notes (Signed)
Patient ID: Alexander Higgins, male    DOB: Feb 20, 1946  Age: 73 y.o. MRN: [id number]    Subjective:  Subjective  HPI   Alexander Higgins presents for cpe pt c/o abd bloating , no pain , some constipation x several weeks   He also c/o the cost of his cardiac meds.  He has not d/w cardiology.     Review of Systems  Constitutional: Negative.  Negative for chills, fever and unexpected weight change.  HENT: Negative for congestion, ear pain, hearing loss, nosebleeds, postnasal drip, rhinorrhea, sinus pressure, sneezing and tinnitus.   Eyes: Negative for photophobia, discharge, itching and visual disturbance.  Respiratory: Negative.  Negative for cough and shortness of breath.   Cardiovascular: Negative.  Negative for chest pain, palpitations and leg swelling.  Gastrointestinal: Positive for abdominal distention. Negative for abdominal pain, anal bleeding, blood in stool, constipation, diarrhea, nausea and vomiting.  Endocrine: Negative.   Genitourinary: Negative.  Negative for dysuria, frequency, hematuria and urgency.  Musculoskeletal: Negative.  Negative for back pain and myalgias.  Skin: Negative.  Negative for rash.  Allergic/Immunologic: Negative.  Negative for environmental allergies.  Neurological: Negative for dizziness, weakness, light-headedness, numbness and headaches.  Hematological: Does not bruise/bleed easily.  Psychiatric/Behavioral: Negative for agitation, confusion, decreased concentration, dysphoric mood, sleep disturbance and suicidal ideas. The patient is not nervous/anxious.     History Past Medical History:  Diagnosis Date  . AICD (automatic cardioverter/defibrillator) present   . Arthritis    "right knee" (04/23/2015)  . Atrial fibrillation ([place])    a. identified on device check in 07/2016.  Marland Kitchen Chronic combined systolic and diastolic CHF (congestive heart failure) ([place]) dx'd 11/2014   a. 05/2016: echo showing EF of 30-35%, Grade 1 DD, trivial MR, mild TR, PA peak pressure  17 mm Hg.   Marland Kitchen Hyperlipidemia   . Hypertension   . Nonischemic cardiomyopathy ([place])    a. s/p ICD placement in 04/2015    He has a past surgical history that includes Tonsillectomy; Insertion of ICD (04/23/2015); and Cardiac catheterization (N/A, 04/23/2015).   His family history includes Heart attack in his brother; Heart disease in his mother; Hypertension in his brother and mother; Stroke in his sister.He reports that he has never smoked. He has never used smokeless tobacco. He reports that he does not drink alcohol or use drugs.  Current Outpatient Medications on File Prior to Visit  Medication Sig Dispense Refill  . apixaban (ELIQUIS) 5 MG TABS tablet Take 1 tablet (5 mg total) by mouth 2 (two) times daily. 90 tablet 1  . carvedilol (COREG) 12.5 MG tablet TAKE 1 TABLET BY MOUTH TWICE DAILY WITH A MEAL KEEP  OFFICE  VISIT 180 tablet 1  . furosemide (LASIX) 40 MG tablet TAKE 1 TABLET BY MOUTH TWICE DAILY . APPOINTMENT REQUIRED FOR FUTURE REFILLS 60 tablet 0  . sacubitril-valsartan (ENTRESTO) 97-103 MG Take 1 tablet by mouth 2 (two) times daily. NEEDS OV 45 tablet 0   No current facility-administered medications on file prior to visit.      Objective:  Objective  Physical Exam Vitals signs and nursing note reviewed.  Constitutional:      General: He is sleeping. He is not in acute distress.    Appearance: He is well-developed. He is not diaphoretic.  HENT:     Head: Normocephalic and atraumatic.     Right Ear: External ear normal.     Left Ear: External ear normal.     Nose: Nose normal.  Mouth/Throat:     Pharynx: No oropharyngeal exudate.  Eyes:     General:        Right eye: No discharge.        Left eye: No discharge.     Conjunctiva/sclera: Conjunctivae normal.     Pupils: Pupils are equal, round, and reactive to light.  Neck:     Musculoskeletal: Normal range of motion and neck supple.     Thyroid: No thyromegaly.     Vascular: No JVD.  Cardiovascular:     Rate  and Rhythm: Normal rate and regular rhythm.     Heart sounds: Normal heart sounds. No murmur.  Pulmonary:     Effort: Pulmonary effort is normal. No respiratory distress.     Breath sounds: Normal breath sounds. No wheezing or rales.  Chest:     Chest wall: No tenderness.  Abdominal:     General: Bowel sounds are normal. There is distension.     Palpations: Abdomen is soft. There is no mass.     Tenderness: There is no abdominal tenderness. There is no guarding or rebound.  Genitourinary:    Comments: Pt refused  Musculoskeletal: Normal range of motion.        General: No tenderness.  Lymphadenopathy:     Cervical: No cervical adenopathy.  Skin:    General: Skin is warm and dry.     Findings: No erythema or rash.  Neurological:     Mental Status: He is oriented to person, place, and time.     Cranial Nerves: No cranial nerve deficit.     Motor: No abnormal muscle tone.     Deep Tendon Reflexes: Reflexes are normal and symmetric. Reflexes normal.  Psychiatric:        Behavior: Behavior normal.        Thought Content: Thought content normal.        Judgment: Judgment normal.    BP 122/80 (BP Location: Right Arm, Patient Position: Sitting, Cuff Size: Large)   Pulse 77   Temp 97.6 F (36.4 C) (Temporal)   Resp 18   Ht 6' (1.829 m)   Wt (!) 300 lb 12.8 oz (136.4 kg)   SpO2 94%   BMI 40.80 kg/m  Wt Readings from Last 3 Encounters:  02/15/19 (!) 300 lb 12.8 oz (136.4 kg)  10/24/17 292 lb (132.5 kg)  09/01/17 290 lb 12.8 oz (131.9 kg)     Lab Results  Component Value Date   WBC 7.8 09/01/2017   HGB 16.9 09/01/2017   HCT 49.4 09/01/2017   PLT 197.0 09/01/2017   GLUCOSE 96 09/01/2017   CHOL 233 (H) 09/01/2017   TRIG 119.0 09/01/2017   HDL 44.40 09/01/2017   LDLCALC 165 (H) 09/01/2017   ALT 27 09/01/2017   AST 25 09/01/2017   NA 141 09/01/2017   K 4.2 09/01/2017   CL 105 09/01/2017   CREATININE 1.18 09/01/2017   BUN 19 09/01/2017   CO2 24 09/01/2017   TSH 1.84  08/13/2016   PSA 0.44 09/01/2017   INR 1.08 04/21/2015   MICROALBUR 0.7 08/01/2015    Dg Chest 2 View  Result Date: 04/24/2015 CLINICAL DATA:  Pacemaker placement EXAM: CHEST  2 VIEW COMPARISON:  11/28/2014 FINDINGS: Interval placement of left subclavian dual lead AICD pacemaker with leads in the right atrium and right ventricle. Negative for pneumothorax. Cardiac enlargement without heart failure. Mild atelectasis in the lung bases. No pleural effusion. IMPRESSION: Satisfactory AICD placement Mild bibasilar atelectasis. Electronically  Signed   By: Franchot Gallo M.D.   On: 04/24/2015 07:00     Assessment & Plan:  Plan  I have discontinued Alexander Higgins's atorvastatin. I am also having him maintain his apixaban, carvedilol, Entresto, and furosemide.  No orders of the defined types were placed in this encounter.   Problem List Items Addressed This Visit      Unprioritized   Chronic combined systolic and diastolic CHF (congestive heart failure) [facility])    Per cardiology On entresto Gave the pt pt assistance forms for [employer] ---- to see if he can get the med from the co       Essential hypertension    Well controlled, no changes to meds. Encouraged heart healthy diet such as the DASH diet and exercise as tolerated.       Relevant Orders   Lipid panel   CBC with Differential/Platelet   PSA   Comprehensive metabolic panel   Hyperlipidemia    Tolerating statin, encouraged heart healthy diet, avoid trans fats, minimize simple carbs and saturated fats. Increase exercise as tolerated      Relevant Orders   Lipid panel   Comprehensive metabolic panel   ICD (implantable cardioverter-defibrillator) in place    On eliquis---  Pt given phone number for pt assistance for this med as well       Paroxysmal atrial fibrillation ([place])    Pt is on eliquis       Preventative health care - Primary    ghm utd Flu shot given See Avs Check labs        Other Visit Diagnoses     Abdominal bloating       Relevant Orders   US Abdomen Complete   Nocturia       Relevant Orders   PSA   Need for influenza vaccination       Relevant Orders   Flu Vaccine QUAD High Dose(Fluad) (Completed)      Follow-up: Return in about 1 year (around 02/15/2020), or if symptoms worsen or fail to improve, for annual exam, fasting.  Ann Held, DO

## 2019-02-15 NOTE — Telephone Encounter (Signed)
I will discuss this with him but it sounds like he needs to f/u with cardiology as well

## 2019-02-16 LAB — COMPREHENSIVE METABOLIC PANEL
ALT: 21 U/L (ref 0–53)
AST: 28 U/L (ref 0–37)
Albumin: 3.9 g/dL (ref 3.5–5.2)
Alkaline Phosphatase: 45 U/L (ref 39–117)
BUN: 17 mg/dL (ref 6–23)
CO2: 29 mEq/L (ref 19–32)
Calcium: 9 mg/dL (ref 8.4–10.5)
Chloride: 105 mEq/L (ref 96–112)
Creatinine, Ser: 1.09 mg/dL (ref 0.40–1.50)
GFR: 80.46 mL/min (ref >60.00)
Glucose, Bld: 85 mg/dL (ref 70–99)
Potassium: 3.7 mEq/L (ref 3.5–5.1)
Sodium: 141 mEq/L (ref 135–145)
Total Bilirubin: 0.7 mg/dL (ref 0.2–1.2)
Total Protein: 7.2 g/dL (ref 6.0–8.3)

## 2019-02-16 LAB — LIPID PANEL
Cholesterol: 204 mg/dL — ABNORMAL HIGH (ref 0–200)
HDL: 36.8 mg/dL — ABNORMAL LOW (ref >39.00)
LDL Cholesterol: 136 mg/dL — ABNORMAL HIGH (ref 0–99)
NonHDL: 166.89
Total CHOL/HDL Ratio: 6
Triglycerides: 154 mg/dL — ABNORMAL HIGH (ref 0.0–149.0)
VLDL: 30.8 mg/dL (ref 0.0–40.0)

## 2019-02-16 LAB — CBC WITH DIFFERENTIAL/PLATELET
Basophils Absolute: 0.1 10*3/uL (ref 0.0–0.1)
Basophils Relative: 1.1 % (ref 0.0–3.0)
Eosinophils Absolute: 0.2 10*3/uL (ref 0.0–0.7)
Eosinophils Relative: 2.3 % (ref 0.0–5.0)
HCT: 47.9 % (ref 39.0–52.0)
Hemoglobin: 16.2 g/dL (ref 13.0–17.0)
Lymphocytes Relative: 33.8 % (ref 12.0–46.0)
Lymphs Abs: 2.4 10*3/uL (ref 0.7–4.0)
MCHC: 33.8 g/dL (ref 30.0–36.0)
MCV: 93.3 fl (ref 78.0–100.0)
Monocytes Absolute: 0.8 10*3/uL (ref 0.1–1.0)
Monocytes Relative: 11.6 % (ref 3.0–12.0)
Neutro Abs: 3.6 10*3/uL (ref 1.4–7.7)
Neutrophils Relative %: 51.2 % (ref 43.0–77.0)
Platelets: 175 10*3/uL (ref 150.0–400.0)
RBC: 5.13 Mil/uL (ref 4.22–5.81)
RDW: 14.4 % (ref 11.5–15.5)
WBC: 7.1 10*3/uL (ref 4.0–10.5)

## 2019-02-16 LAB — PSA: PSA: 0.49 ng/mL (ref 0.10–4.00)

## 2019-02-20 ENCOUNTER — Other Ambulatory Visit: Payer: Self-pay | Admitting: Cardiovascular Disease

## 2019-02-21 ENCOUNTER — Other Ambulatory Visit: Payer: Self-pay | Admitting: Family Medicine

## 2019-02-21 DIAGNOSIS — E785 Hyperlipidemia, unspecified: Secondary | ICD-10-CM

## 2019-02-22 ENCOUNTER — Other Ambulatory Visit: Payer: Self-pay

## 2019-02-22 ENCOUNTER — Ambulatory Visit (HOSPITAL_BASED_OUTPATIENT_CLINIC_OR_DEPARTMENT_OTHER)
Admission: RE | Admit: 2019-02-22 | Discharge: 2019-02-22 | Disposition: A | Discharge status: Home / Self Care | Payer: Medicare PPO | Source: Ambulatory Visit | Attending: Family Medicine | Admitting: Family Medicine

## 2019-02-22 ENCOUNTER — Encounter: Payer: Self-pay | Admitting: Family Medicine

## 2019-02-22 DIAGNOSIS — R140 Abdominal distension (gaseous): Secondary | ICD-10-CM | POA: Diagnosis not present

## 2019-02-23 ENCOUNTER — Other Ambulatory Visit: Payer: Self-pay | Admitting: Family Medicine

## 2019-02-23 ENCOUNTER — Encounter: Payer: Self-pay | Admitting: Family Medicine

## 2019-02-23 DIAGNOSIS — N281 Cyst of kidney, acquired: Secondary | ICD-10-CM

## 2019-02-23 DIAGNOSIS — R140 Abdominal distension (gaseous): Secondary | ICD-10-CM

## 2019-02-23 MED ORDER — ROSUVASTATIN CALCIUM 5 MG PO TABS: 5.0000 mg | ORAL_TABLET | Freq: Every day | ORAL | 2 refills | Status: DC

## 2019-03-06 ENCOUNTER — Encounter: Payer: Self-pay | Admitting: Family Medicine

## 2019-03-06 DIAGNOSIS — R351 Nocturia: Secondary | ICD-10-CM

## 2019-03-06 NOTE — Telephone Encounter (Deleted)
We sent a referral to [facility] in [place] on 02/23/2019. I called them and they advised me they have tried to contact you two times. Please contact them directly at [phone] to schedule an appointment. Thank you.

## 2019-03-10 ENCOUNTER — Other Ambulatory Visit: Payer: Self-pay | Admitting: Cardiovascular Disease

## 2019-03-25 ENCOUNTER — Other Ambulatory Visit: Payer: Self-pay | Admitting: Cardiovascular Disease

## 2019-04-02 ENCOUNTER — Other Ambulatory Visit: Payer: Self-pay | Admitting: Cardiovascular Disease

## 2019-04-06 ENCOUNTER — Telehealth: Payer: Self-pay | Admitting: Cardiovascular Disease

## 2019-04-06 ENCOUNTER — Encounter: Payer: Self-pay | Admitting: Cardiovascular Disease

## 2019-04-06 ENCOUNTER — Ambulatory Visit (INDEPENDENT_AMBULATORY_CARE_PROVIDER_SITE_OTHER): Payer: Medicare PPO | Admitting: Cardiovascular Disease

## 2019-04-06 ENCOUNTER — Other Ambulatory Visit: Payer: Self-pay

## 2019-04-06 VITALS — BP 115/76 | HR 70 | Temp 96.8°F | Ht 72.0 in | Wt 298.6 lb

## 2019-04-06 DIAGNOSIS — I5042 Chronic combined systolic (congestive) and diastolic (congestive) heart failure: Secondary | ICD-10-CM

## 2019-04-06 DIAGNOSIS — I480 Paroxysmal atrial fibrillation: Secondary | ICD-10-CM

## 2019-04-06 DIAGNOSIS — E7800 Pure hypercholesterolemia, unspecified: Secondary | ICD-10-CM

## 2019-04-06 DIAGNOSIS — N522 Drug-induced erectile dysfunction: Secondary | ICD-10-CM

## 2019-04-06 DIAGNOSIS — Z95810 Presence of automatic (implantable) cardiac defibrillator: Secondary | ICD-10-CM

## 2019-04-06 DIAGNOSIS — Z7901 Long term (current) use of anticoagulants: Secondary | ICD-10-CM

## 2019-04-06 DIAGNOSIS — I10 Essential (primary) hypertension: Secondary | ICD-10-CM

## 2019-04-06 MED ORDER — ROSUVASTATIN CALCIUM 10 MG PO TABS: 10.0000 mg | ORAL_TABLET | Freq: Every day | ORAL | 3 refills | Status: DC

## 2019-04-06 MED ORDER — SILDENAFIL CITRATE 20 MG PO TABS: 20.0000 mg | ORAL_TABLET | Freq: Three times a day (TID) | ORAL | 0 refills | Status: DC

## 2019-04-06 NOTE — Patient Instructions (Signed)
Medication Instructions:  INCREASE the Rosuvastatin to 10 mg once daily TAKE Sildenafil 100 mg as needed (5 of the 20 mg tablets)  *If you need a refill on your cardiac medications before your next appointment, please call your pharmacy*  Lab Work: None ordered If you have labs (blood work) drawn today and your tests are completely normal, you will receive your results only by: Marland Kitchen MyChart Message (if you have MyChart) OR . A paper copy in the mail If you have any lab test that is abnormal or we need to change your treatment, we will call you to review the results.  Testing/Procedures: None ordered  Follow-Up: At [facility], you and your health needs are our priority.  As part of our continuing mission to provide you with exceptional heart care, we have created designated Provider Care Teams.  These Care Teams include your primary Cardiologist (physician) and Advanced Practice Providers (APPs -  Physician Assistants and Nurse Practitioners) who all work together to provide you with the care you need, when you need it.  Your next appointment:   12 months  The format for your next appointment:   In Person  Provider:   Sanda Klein, MD

## 2019-04-06 NOTE — Progress Notes (Signed)
Patient ID: Alexander Higgins, male   DOB: [birth year], 73 y.o.   MRN: [id number]     Cardiology Office Note    Date:  04/06/2019   ID:  Alexander Higgins, DOB 08-21-1945, MRN [id number]  PCP:  Carollee Herter, Alferd Apa, DO  Cardiologist:   Quay Burow, M.D.; Sanda Klein, MD   Chief complaint:  Follow-up ICD, atrial fibrillation   History of Present Illness:  Alexander Higgins is a 73 y.o. male , roughly 2.5 years status post implantation of a [employer] dual-chamber defibrillator for severe nonischemic cardiomyopathy with a left ventricular ejection fraction of approximately 30%  and symptomatic sinus bradycardia.  He continues to feel great.  He can no longer go to the gym but has started walking 2 miles a day, 35-minute periods.  He had difficulty with stamina in the beginning, but now can do it without a problem.  He has not had issues with exertional dyspnea, angina, dizziness, palpitations or syncope.  He denies leg edema.  He has not had any defibrillator discharges. He is on maximum tolerated doses of Entresto and carvedilol.  He has problems with maintaining erections.  He does not require loop diuretics.  He is taking Eliquis for asymptomatic paroxysmal atrial fibrillation and has not had any bleeding problems, falls or injuries.  Interrogation of his defibrillator shows normal device function estimated generator longevity of 10.5 years and excellent lead parameters.  He has 60% atrial pacing and 4% ventricular pacing.  He has had only 2% atrial fibrillation, with the most recent episode on October 9.  Ventricular rate control is good.  He had a couple of episodes of nonsustained ventricular tachycardia in August, and a single episode October 14.   Past Medical History:  Diagnosis Date  . AICD (automatic cardioverter/defibrillator) present   . Arthritis    "right knee" (04/23/2015)  . Atrial fibrillation ([place])    a. identified on device check in 07/2016.  Marland Kitchen Chronic combined  systolic and diastolic CHF (congestive heart failure) ([place]) dx'd 11/2014   a. 05/2016: echo showing EF of 30-35%, Grade 1 DD, trivial MR, mild TR, PA peak pressure 17 mm Hg.   Marland Kitchen Hyperlipidemia   . Hypertension   . Nonischemic cardiomyopathy ([place])    a. s/p ICD placement in 04/2015    Past Surgical History:  Procedure Laterality Date  . EP IMPLANTABLE DEVICE N/A 04/23/2015   Procedure: ICD Implant;  Surgeon: Sanda Klein, MD;  Location: [facility];  Service: Cardiovascular;  Laterality: N/A;  . INSERTION OF ICD  04/23/2015  . TONSILLECTOMY      Current Outpatient Medications  Medication Sig Dispense Refill  . apixaban (ELIQUIS) 5 MG TABS tablet Take 1 tablet (5 mg total) by mouth 2 (two) times daily. 90 tablet 1  . carvedilol (COREG) 12.5 MG tablet TAKE 1 TABLET BY MOUTH TWICE DAILY WITH MEALS OFFICE  VISIT  FOR  REFILLS 60 tablet 0  . ENTRESTO 97-103 MG Take 1 tablet by mouth twice daily 30 tablet 1  . furosemide (LASIX) 40 MG tablet TAKE 1 TABLET BY MOUTH TWICE DAILY . APPOINTMENT REQUIRED FOR FUTURE REFILLS 30 tablet 0  . rosuvastatin (CRESTOR) 10 MG tablet Take 1 tablet (10 mg total) by mouth at bedtime. 90 tablet 3  . sildenafil (REVATIO) 20 MG tablet Take 1 tablet (20 mg total) by mouth 3 (three) times daily. 90 tablet 0   No current facility-administered medications for this visit.     Allergies:   Patient  has no known allergies.   Social History   Socioeconomic History  . Marital status: Divorced    Spouse name: Not on file  . Number of children: Not on file  . Years of education: Not on file  . Highest education level: Not on file  Occupational History  . Not on file  Social Needs  . Financial resource strain: Not on file  . Food insecurity    Worry: Not on file    Inability: Not on file  . Transportation needs    Medical: Not on file    Non-medical: Not on file  Tobacco Use  . Smoking status: Never Smoker  . Smokeless tobacco: Never Used  Substance  and Sexual Activity  . Alcohol use: No    Alcohol/week: 0.0 standard drinks  . Drug use: No  . Sexual activity: Yes  Lifestyle  . Physical activity    Days per week: Not on file    Minutes per session: Not on file  . Stress: Not on file  Relationships  . Social Herbalist on phone: Not on file    Gets together: Not on file    Attends religious service: Not on file    Active member of club or organization: Not on file    Attends meetings of clubs or organizations: Not on file    Relationship status: Not on file  Other Topics Concern  . Not on file  Social History Narrative   Exercise in gym every day     Family History:  The patient's family history includes Heart attack in his brother; Heart disease in his mother; Hypertension in his brother and mother; Stroke in his sister.   ROS:   Please see the history of present illness.    ROS All other systems reviewed and are negative.   PHYSICAL EXAM:   VS:  BP 115/76   Pulse 70   Temp (!) 96.8 F (36 C)   Ht 6' (1.829 m)   Wt 298 lb 9.6 oz (135.4 kg)   SpO2 91%   BMI 40.50 kg/m      General: Alert, oriented x3, no distress, obese, also very muscular Head: no evidence of trauma, PERRL, EOMI, no exophtalmos or lid lag, no myxedema, no xanthelasma; normal ears, nose and oropharynx Neck: normal jugular venous pulsations and no hepatojugular reflux; brisk carotid pulses without delay and no carotid bruits Chest: clear to auscultation, no signs of consolidation by percussion or palpation, normal fremitus, symmetrical and full respiratory excursions Cardiovascular: normal position and quality of the apical impulse, regular rhythm, normal first and second heart sounds, no murmurs, rubs or gallops Abdomen: no tenderness or distention, no masses by palpation, no abnormal pulsatility or arterial bruits, normal bowel sounds, no hepatosplenomegaly Extremities: no clubbing, cyanosis or edema; 2+ radial, ulnar and brachial  pulses bilaterally; 2+ right femoral, posterior tibial and dorsalis pedis pulses; 2+ left femoral, posterior tibial and dorsalis pedis pulses; no subclavian or femoral bruits Neurological: grossly nonfocal Psych: Normal mood and affect    Wt Readings from Last 3 Encounters:  04/06/19 298 lb 9.6 oz (135.4 kg)  02/15/19 (!) 300 lb 12.8 oz (136.4 kg)  10/24/17 292 lb (132.5 kg)      Studies/Labs Reviewed:   EKG:  EKG is ordered today.  It shows normal sinus rhythm with sinus arrhythmia.  QTc 495 ms.  Recent Labs: 02/15/2019: ALT 21; BUN 17; Creatinine, Ser 1.09; Hemoglobin 16.2; Platelets 175.0; Potassium 3.7; Sodium  141   Lipid Panel    Component Value Date/Time   CHOL 204 (H) 02/15/2019 1443   TRIG 154.0 (H) 02/15/2019 1443   HDL 36.80 (L) 02/15/2019 1443   CHOLHDL 6 02/15/2019 1443   VLDL 30.8 02/15/2019 1443   LDLCALC 136 (H) 02/15/2019 1443      ASSESSMENT:    1. Paroxysmal atrial fibrillation (HCC)   2. Chronic combined systolic and diastolic heart failure ([place])   3. ICD (implantable cardioverter-defibrillator) in place   4. Long term current use of anticoagulant   5. Essential hypertension   6. Drug-induced erectile dysfunction   7. Hypercholesteremia      PLAN:  In order of problems listed above:  1.  CHF: Hemic cardiomyopathy with EF 30-35%.  NYHA functional class I at this point, maintaining euvolemia without loop diuretics. 2. ICD: Normal device function.  He has occasional nonsustained VT and has never required therapy.  Continue remote downloads every 3 months. 3. Afib: Paroxysmal events are asymptomatic and well rate controlled.  Overall burden is very low at 2% or less.. CHADSVasc 3 (age, HF, HTN), on anticoagulant. 4. Eliquis: No bleeding complications 5. HTN: Controlled 6. ED: 50 mg dose was ineffective and I think he can safely take 100 mg.  Discussed possible side effects.  He is aware that he should never combine this medication with nitrates due to  the risk of fatal hypotension. 7. HLP: Has been eating 3 eggs a day every week.  He now understands the need to cut back on intake of cholesterol rich foods, regardless I think he should increase his rosuvastatin to 10 mg daily to achieve target LDL less than 100.  Not have known CAD or PAD.   Medication Adjustments/Labs and Tests Ordered: Current medicines are reviewed at length with the patient today.  Concerns regarding medicines are outlined above.  Medication changes, Labs and Tests ordered today are listed below. Patient Instructions  Medication Instructions:  INCREASE the Rosuvastatin to 10 mg once daily TAKE Sildenafil 100 mg as needed (5 of the 20 mg tablets)  *If you need a refill on your cardiac medications before your next appointment, please call your pharmacy*  Lab Work: None ordered If you have labs (blood work) drawn today and your tests are completely normal, you will receive your results only by: Marland Kitchen MyChart Message (if you have MyChart) OR . A paper copy in the mail If you have any lab test that is abnormal or we need to change your treatment, we will call you to review the results.  Testing/Procedures: None ordered  Follow-Up: At [facility], you and your health needs are our priority.  As part of our continuing mission to provide you with exceptional heart care, we have created designated Provider Care Teams.  These Care Teams include your primary Cardiologist (physician) and Advanced Practice Providers (APPs -  Physician Assistants and Nurse Practitioners) who all work together to provide you with the care you need, when you need it.  Your next appointment:   12 months  The format for your next appointment:   In Person  Provider:   Sanda Klein, MD      Signed, Sanda Klein, MD  04/06/2019 9:32 AM    [address], [place], [place]  [address] Phone: [phone]; Fax: [phone]

## 2019-04-06 NOTE — Telephone Encounter (Signed)
Alexander Higgins, daughter of the patient called. She wanted to be on speaker during her Dad's appointment, but that did not work out. She would like to know if Dr. Lurline Del nurse could call her later to discuss the recent appointment.

## 2019-04-06 NOTE — Telephone Encounter (Signed)
Left a message for the patient to call back.  

## 2019-04-10 ENCOUNTER — Ambulatory Visit (INDEPENDENT_AMBULATORY_CARE_PROVIDER_SITE_OTHER): Payer: Medicare PPO | Admitting: *Deleted

## 2019-04-10 ENCOUNTER — Telehealth: Payer: Self-pay | Admitting: Cardiovascular Disease

## 2019-04-10 DIAGNOSIS — I428 Other cardiomyopathies: Secondary | ICD-10-CM

## 2019-04-10 DIAGNOSIS — I480 Paroxysmal atrial fibrillation: Secondary | ICD-10-CM

## 2019-04-10 NOTE — Telephone Encounter (Signed)
Spoke to pt daughter and was wanting to know why the pt O2 was 91% yesterday. Explained that sometimes wearing a mask can make O2 levels decrease. Pt daughter also wanted to know why his Rosuvastatin was increased. Read from Dr. Sallyanne Kuster note where the pt LDL was increased and that he wanted to double dosage to control that.

## 2019-04-10 NOTE — Telephone Encounter (Signed)
New message:    Patient daughter calling and would like for some one to call her back concering a appt on yesterday. Daughter would not tell me what she wanted. Please call back.

## 2019-04-11 LAB — CUP PACEART REMOTE DEVICE CHECK
Battery Remaining Longevity: 114 mo
Battery Remaining Percentage: 100 %
Brady Statistic RA Percent Paced: 16 %
Brady Statistic RV Percent Paced: 4 %
Date Time Interrogation Session: 20201027174900
HighPow Impedance: 80 Ohm
Implantable Lead Implant Date: 20161109
Implantable Lead Implant Date: 20161109
Implantable Lead Location: 753859
Implantable Lead Location: 753860
Implantable Lead Model: 293
Implantable Lead Model: 7741
Implantable Lead Serial Number: 385866
Implantable Lead Serial Number: 710559
Implantable Pulse Generator Implant Date: 20161109
Lead Channel Impedance Value: 472 Ohm
Lead Channel Impedance Value: 760 Ohm
Lead Channel Pacing Threshold Amplitude: 0.8 V
Lead Channel Pacing Threshold Amplitude: 0.8 V
Lead Channel Pacing Threshold Pulse Width: 0.4 ms
Lead Channel Pacing Threshold Pulse Width: 0.4 ms
Lead Channel Setting Pacing Amplitude: 2.4 V
Lead Channel Setting Pacing Amplitude: 2.4 V
Lead Channel Setting Pacing Pulse Width: 0.4 ms
Lead Channel Setting Sensing Sensitivity: 0.6 mV
Pulse Gen Serial Number: 512157

## 2019-04-11 NOTE — Telephone Encounter (Signed)
Spoke with the daughter. She had already spoken to a nurse yesterday and had all of her questions answered.

## 2019-04-18 ENCOUNTER — Other Ambulatory Visit: Payer: Self-pay | Admitting: Cardiovascular Disease

## 2019-05-01 NOTE — Progress Notes (Signed)
Remote ICD transmission.   

## 2019-05-03 ENCOUNTER — Other Ambulatory Visit: Payer: Self-pay | Admitting: Cardiovascular Disease

## 2019-05-07 ENCOUNTER — Other Ambulatory Visit: Payer: Self-pay | Admitting: Cardiovascular Disease

## 2019-05-17 ENCOUNTER — Other Ambulatory Visit: Payer: Self-pay | Admitting: Cardiovascular Disease

## 2019-06-10 DIAGNOSIS — Z7982 Long term (current) use of aspirin: Secondary | ICD-10-CM | POA: Diagnosis not present

## 2019-06-10 DIAGNOSIS — U071 COVID-19: Secondary | ICD-10-CM | POA: Diagnosis not present

## 2019-06-10 DIAGNOSIS — R05 Cough: Secondary | ICD-10-CM | POA: Diagnosis not present

## 2019-06-10 DIAGNOSIS — Z6839 Body mass index (BMI) 39.0-39.9, adult: Secondary | ICD-10-CM | POA: Diagnosis not present

## 2019-06-13 ENCOUNTER — Encounter: Payer: Self-pay | Admitting: Family Medicine

## 2019-06-21 ENCOUNTER — Other Ambulatory Visit: Payer: Self-pay | Admitting: Cardiovascular Disease

## 2019-07-10 ENCOUNTER — Ambulatory Visit (INDEPENDENT_AMBULATORY_CARE_PROVIDER_SITE_OTHER): Payer: Medicare PPO | Admitting: *Deleted

## 2019-07-10 DIAGNOSIS — I480 Paroxysmal atrial fibrillation: Secondary | ICD-10-CM | POA: Diagnosis not present

## 2019-07-11 LAB — CUP PACEART REMOTE DEVICE CHECK
Battery Remaining Longevity: 120 mo
Battery Remaining Percentage: 100 %
Brady Statistic RA Percent Paced: 16 %
Brady Statistic RV Percent Paced: 4 %
Date Time Interrogation Session: 20210126140100
HighPow Impedance: 79 Ohm
Implantable Lead Implant Date: 20161109
Implantable Lead Implant Date: 20161109
Implantable Lead Location: 753859
Implantable Lead Location: 753860
Implantable Lead Model: 293
Implantable Lead Model: 7741
Implantable Lead Serial Number: 385866
Implantable Lead Serial Number: 710559
Implantable Pulse Generator Implant Date: 20161109
Lead Channel Impedance Value: 481 Ohm
Lead Channel Impedance Value: 757 Ohm
Lead Channel Pacing Threshold Amplitude: 0.8 V
Lead Channel Pacing Threshold Amplitude: 0.8 V
Lead Channel Pacing Threshold Pulse Width: 0.4 ms
Lead Channel Pacing Threshold Pulse Width: 0.4 ms
Lead Channel Setting Pacing Amplitude: 2.4 V
Lead Channel Setting Pacing Amplitude: 2.4 V
Lead Channel Setting Pacing Pulse Width: 0.4 ms
Lead Channel Setting Sensing Sensitivity: 0.6 mV
Pulse Gen Serial Number: 512157

## 2019-07-19 ENCOUNTER — Encounter: Payer: Self-pay | Admitting: Family Medicine

## 2019-07-19 DIAGNOSIS — Z1211 Encounter for screening for malignant neoplasm of colon: Secondary | ICD-10-CM

## 2019-07-19 NOTE — Telephone Encounter (Signed)
Ok to put order in but we need to know with which physician --- and they will schedule with the pt

## 2019-07-23 NOTE — Addendum Note (Signed)
Addended by: Kelle Darting A on: 07/23/2019 10:23 AM   Modules accepted: Orders

## 2019-07-27 ENCOUNTER — Ambulatory Visit: Payer: Medicare PPO | Admitting: Physician Assistant

## 2019-07-27 ENCOUNTER — Encounter: Payer: Self-pay | Admitting: Family Medicine

## 2019-08-01 ENCOUNTER — Ambulatory Visit: Payer: Medicare HMO | Admitting: Physician Assistant

## 2019-08-06 ENCOUNTER — Encounter: Payer: Self-pay | Admitting: Family Medicine

## 2019-10-09 ENCOUNTER — Ambulatory Visit (INDEPENDENT_AMBULATORY_CARE_PROVIDER_SITE_OTHER): Payer: Medicare HMO | Admitting: *Deleted

## 2019-10-09 DIAGNOSIS — I480 Paroxysmal atrial fibrillation: Secondary | ICD-10-CM | POA: Diagnosis not present

## 2019-10-09 LAB — CUP PACEART REMOTE DEVICE CHECK
Battery Remaining Longevity: 126 mo
Battery Remaining Percentage: 100 %
Brady Statistic RA Percent Paced: 15 %
Brady Statistic RV Percent Paced: 4 %
Date Time Interrogation Session: 20210427113800
HighPow Impedance: 79 Ohm
Implantable Lead Implant Date: 20161109
Implantable Lead Implant Date: 20161109
Implantable Lead Location: 753859
Implantable Lead Location: 753860
Implantable Lead Model: 293
Implantable Lead Model: 7741
Implantable Lead Serial Number: 385866
Implantable Lead Serial Number: 710559
Implantable Pulse Generator Implant Date: 20161109
Lead Channel Impedance Value: 514 Ohm
Lead Channel Impedance Value: 747 Ohm
Lead Channel Pacing Threshold Amplitude: 0.8 V
Lead Channel Pacing Threshold Amplitude: 0.8 V
Lead Channel Pacing Threshold Pulse Width: 0.4 ms
Lead Channel Pacing Threshold Pulse Width: 0.4 ms
Lead Channel Setting Pacing Amplitude: 2.4 V
Lead Channel Setting Pacing Amplitude: 2.4 V
Lead Channel Setting Pacing Pulse Width: 0.4 ms
Lead Channel Setting Sensing Sensitivity: 0.6 mV
Pulse Gen Serial Number: 512157

## 2019-10-10 NOTE — Progress Notes (Signed)
ICD Remote  

## 2019-10-12 ENCOUNTER — Encounter: Payer: Self-pay | Admitting: Physician Assistant

## 2019-10-13 ENCOUNTER — Other Ambulatory Visit: Payer: Self-pay | Admitting: Cardiovascular Disease

## 2019-11-06 ENCOUNTER — Ambulatory Visit: Payer: Medicare HMO | Admitting: Physician Assistant

## 2019-11-06 ENCOUNTER — Telehealth: Payer: Self-pay

## 2019-11-06 ENCOUNTER — Encounter: Payer: Self-pay | Admitting: Physician Assistant

## 2019-11-06 VITALS — BP 120/70 | HR 64 | Ht 72.0 in | Wt 307.8 lb

## 2019-11-06 DIAGNOSIS — Z86010 Personal history of colonic polyps: Secondary | ICD-10-CM | POA: Diagnosis not present

## 2019-11-06 DIAGNOSIS — R140 Abdominal distension (gaseous): Secondary | ICD-10-CM

## 2019-11-06 DIAGNOSIS — Z860101 Personal history of adenomatous and serrated colon polyps: Secondary | ICD-10-CM

## 2019-11-06 NOTE — Addendum Note (Signed)
Addended by: Aleatha Borer on: 11/06/2019 01:29 PM   Modules accepted: Orders

## 2019-11-06 NOTE — Telephone Encounter (Signed)
Patient with diagnosis of afib on Eliquis for anticoagulation.    Procedure: colonoscopy Date of procedure: 01/22/20  CHADS2-VASc score of 3 (age, CHF, HTN)  CrCl 63mL/min using adjusted body weight Platelet count 175K  Per office protocol, patient can hold Eliquis for 2 days prior to procedure.

## 2019-11-06 NOTE — Telephone Encounter (Signed)
[facility] Pre-operative Risk Assessment     Request for surgical clearance:     Endoscopy Procedure  What type of surgery is being performed?     Colonoscopy  When is this surgery scheduled?     01/22/20  What type of clearance is required ?   Pharmacy  Are there any medications that need to be held prior to surgery and how long? Eliquis starting 2 days prior  Practice name and name of physician performing surgery?      [facility]  What is your office phone and fax number?      Phone- [phone]  [phone]  Anesthesia type (None, local, MAC, general) ?       MAC

## 2019-11-06 NOTE — Progress Notes (Signed)
Subjective:    Patient ID: Alexander Higgins, male    DOB: July 30, 1945, 74 y.o.   MRN: [id number]  HPI Alexander Higgins is a pleasant 74 year old African-American male, self-referred today for follow-up colonoscopy.  Patient states his last colonoscopy was done about 6 years ago at [facility], and that he was told to have follow-up at 5 years.  Report is visible in care everywhere and patient did have 2 polyps removed in July 2015, one was hyperplastic and the other was an 8 mm adenomatous polyp. Patient has GI symptoms other than chronic problems with bloating.  He says he is also gained weight over the past year or so and has been starting to exercise again with walking.  He specifically has no complaints of abdominal pain, no changes in bowel habits, no constipation or diarrhea, no melena or hematochezia. Patient has history of hypertension, left ventricular dysfunction with nonischemic cardiomyopathy and last EF documented at 30 to 35% in 2017.  He is status post ICD. Also with history of atrial fibrillation for which he is on Eliquis. He is followed by Dr. Croitoru/cardiology.  He has not had any recent cardiac issues.  Review of Systems Pertinent positive and negative review of systems were noted in the above HPI section.  All other review of systems was otherwise negative.  Outpatient Encounter Medications as of 73/25/2021  Medication Sig  . carvedilol (COREG) 12.5 MG tablet Take 1 tablet (12.5 mg total) by mouth 2 (two) times daily with a meal. TAKE 1 TABLET BY MOUTH TWICE DAILY WITH MEALS  . ELIQUIS 5 MG TABS tablet Take 1 tablet by mouth twice daily  . ENTRESTO 97-103 MG Take 1 tablet by mouth twice daily  . furosemide (LASIX) 40 MG tablet Take 1 tablet (40 mg total) by mouth 2 (two) times daily.  . rosuvastatin (CRESTOR) 10 MG tablet Take 1 tablet (10 mg total) by mouth at bedtime.  . sildenafil (REVATIO) 20 MG tablet Take 1 tablet (20 mg total) by mouth 3 (three) times daily.   No  facility-administered encounter medications on file as of 11/06/2019.   No Known Allergies Patient Active Problem List   Diagnosis Date Noted  . Preventative health care 09/01/2017  . Paroxysmal atrial fibrillation ([place]) 09/24/2016  . Long term current use of anticoagulant 09/24/2016  . At risk for sudden cardiac death April 29, 2015  . Symptomatic sinus bradycardia Apr 29, 2015  . ICD (implantable cardioverter-defibrillator) in place 29-Apr-2015  . Non-ischemic cardiomyopathy ([place])   . Hyperlipidemia 01/01/2015  . Chronic combined systolic and diastolic CHF (congestive heart failure) ([place]) 12/13/2014  . Essential hypertension 12/09/2014  . LV dysfunction 12/09/2014  . Dupuytren's contracture of foot 03/22/2012  . Dermatitis seborrheica 07/08/2010   Social History   Socioeconomic History  . Marital status: Divorced    Spouse name: Not on file  . Number of children: 5  . Years of education: Not on file  . Highest education level: Not on file  Occupational History  . Not on file  Tobacco Use  . Smoking status: Never Smoker  . Smokeless tobacco: Never Used  Substance and Sexual Activity  . Alcohol use: No    Alcohol/week: 0.0 standard drinks  . Drug use: No  . Sexual activity: Yes  Other Topics Concern  . Not on file  Social History Narrative   Exercise in gym every day   Social Determinants of Health   Financial Resource Strain:   . Difficulty of Paying Living Expenses:   Food Insecurity:   .  Worried About Charity fundraiser in the Last Year:   . Arboriculturist in the Last Year:   Transportation Needs:   . Film/video editor (Medical):   Marland Kitchen Lack of Transportation (Non-Medical):   Physical Activity:   . Days of Exercise per Week:   . Minutes of Exercise per Session:   Stress:   . Feeling of Stress :   Social Connections:   . Frequency of Communication with Friends and Family:   . Frequency of Social Gatherings with Friends and Family:   . Attends Religious  Services:   . Active Member of Clubs or Organizations:   . Attends Archivist Meetings:   Marland Kitchen Marital Status:   Intimate Partner Violence:   . Fear of Current or Ex-Partner:   . Emotionally Abused:   Marland Kitchen Physically Abused:   . Sexually Abused:     Alexander Higgins family history includes Heart attack in his brother; Heart disease in his mother; Hypertension in his brother and mother; Stroke in his sister.      Objective:    Vitals:   11/06/19 0945  BP: 120/70  Pulse: 64    Physical Exam Well-developed well-nourished older AA male in no acute distress.  Height, Weight, 307BMI 41.76  HEENT; nontraumatic normocephalic, EOMI, PER R LA, sclera anicteric. Oropharynx;not examined Neck; supple, no JVD Cardiovascular; regular rate and rhythm with S1-S2, no murmur rub or gallop Pulmonary; Clear bilaterally Abdomen; soft,obese , nontender, nondistended, no palpable mass or hepatosplenomegaly, bowel sounds are active Rectal;not done Skin; benign exam, no jaundice rash or appreciable lesions Extremities; no clubbing cyanosis or edema skin warm and dry Neuro/Psych; alert and oriented x4, grossly nonfocal mood and affect appropriate       Assessment & Plan:   #77 74 year old African-American male with history of adenomatous colon polyps, last colonoscopy July 2015 done at [facility], here to schedule follow-up colonoscopy. He is currently asymptomatic other than complaints of chronic abdominal bloating.  #2, hypertension 3.  Nonischemic cardiomyopathy with last EF 2017 at 30 to 35% 4.  Status post ICD 5.  Atrial fibrillation  Plan; Patient will be scheduled for colonoscopy with Dr. Loletha Carrow at the hospital given EF of 30 to 35%.  Procedure was discussed in detail with the patient including indications risks and benefits and he is agreeable to proceed. We will communicate with his cardiologist to assure that holding Eliquis for 48 hours prior to procedure is reasonable for this  patient. Patient has received COVID-19 vaccine x2. Regarding complaints of chronic bloating, he was given copy of a low gas diet, is also been taking in a lot of Kool-Aid with sugar mixed with artificial sweetener.  He was advised to avoid artificial sweeteners other than Stevia.    Kelsa Jaworowski S Kennice Finnie PA-C 11/06/2019   Cc: Ann Held, *

## 2019-11-06 NOTE — Patient Instructions (Addendum)
If you are age 73 or older, your body mass index should be between 23-30. Your Body mass index is 41.75 kg/m. If this is out of the aforementioned range listed, please consider follow up with your Primary Care Provider.  If you are age 92 or younger, your body mass index should be between 19-25. Your Body mass index is 41.75 kg/m. If this is out of the aformentioned range listed, please consider follow up with your Primary Care Provider.   You have been scheduled for a colonoscopy. Please follow written instructions given to you at your visit today.  Please pick up your prep supplies at the pharmacy within the next 1-3 days. If you use inhalers (even only as needed), please bring them with you on the day of your procedure.   You will be contaced by our office prior to your procedure for directions on holding your Eliquis.  If you do not hear from our office 1 week prior to your scheduled procedure, please call [phone] to discuss.  START a Low gas diet. Avoid artificial sweeteners and try using Stevia.

## 2019-11-06 NOTE — Telephone Encounter (Signed)
   Primary Cardiologist: Sanda Klein, MD  Chart reviewed as part of pre-operative protocol coverage. Given past medical history and time since last visit, based on ACC/AHA guidelines, Alexander Higgins would be at acceptable risk for the planned procedure without further cardiovascular testing.   Patient with diagnosis of afib on Eliquis for anticoagulation.    Procedure: colonoscopy Date of procedure: 01/22/20  CHADS2-VASc score of 3 (age, CHF, HTN)  CrCl 59mL/min using adjusted body weight Platelet count 175K  Per office protocol, patient can hold Eliquis for 2 days prior to procedure.   I will route this recommendation to the requesting party via Epic fax function and remove from pre-op pool.  Please call with questions.  [name]. [name] NP-C    11/06/2019, 2:51 PM [address] Office [phone] Fax [phone]

## 2019-11-06 NOTE — Telephone Encounter (Signed)
Can you please comment on how long Eliquis can be held prior to colonoscopy?  Thank you!

## 2019-11-07 NOTE — Progress Notes (Signed)
.  hdr

## 2019-11-07 NOTE — Progress Notes (Signed)
____________________________________________________________  Attending physician addendum:  Thank you for sending this case to me and discussing during the clinic visit. I have reviewed the entire note, and the outlined plan is what we discussed.  6 year interval for single adenomatous polyp is acceptable.  Wilfrid Lund, MD  ____________________________________________________________

## 2019-11-14 ENCOUNTER — Other Ambulatory Visit: Payer: Self-pay | Admitting: Cardiovascular Disease

## 2019-12-07 ENCOUNTER — Other Ambulatory Visit: Payer: Self-pay | Admitting: Cardiovascular Disease

## 2020-01-08 ENCOUNTER — Ambulatory Visit (INDEPENDENT_AMBULATORY_CARE_PROVIDER_SITE_OTHER): Payer: Medicare HMO | Admitting: *Deleted

## 2020-01-08 DIAGNOSIS — I428 Other cardiomyopathies: Secondary | ICD-10-CM | POA: Diagnosis not present

## 2020-01-09 ENCOUNTER — Encounter (HOSPITAL_COMMUNITY): Payer: Self-pay

## 2020-01-09 LAB — CUP PACEART REMOTE DEVICE CHECK
Battery Remaining Longevity: 114 mo
Battery Remaining Percentage: 100 %
Brady Statistic RA Percent Paced: 15 %
Brady Statistic RV Percent Paced: 4 %
Date Time Interrogation Session: 20210727142900
HighPow Impedance: 79 Ohm
Implantable Lead Implant Date: 20161109
Implantable Lead Implant Date: 20161109
Implantable Lead Location: 753859
Implantable Lead Location: 753860
Implantable Lead Model: 293
Implantable Lead Model: 7741
Implantable Lead Serial Number: 385866
Implantable Lead Serial Number: 710559
Implantable Pulse Generator Implant Date: 20161109
Lead Channel Impedance Value: 467 Ohm
Lead Channel Impedance Value: 746 Ohm
Lead Channel Pacing Threshold Amplitude: 0.8 V
Lead Channel Pacing Threshold Amplitude: 0.8 V
Lead Channel Pacing Threshold Pulse Width: 0.4 ms
Lead Channel Pacing Threshold Pulse Width: 0.4 ms
Lead Channel Setting Pacing Amplitude: 2.4 V
Lead Channel Setting Pacing Amplitude: 2.4 V
Lead Channel Setting Pacing Pulse Width: 0.4 ms
Lead Channel Setting Sensing Sensitivity: 0.6 mV
Pulse Gen Serial Number: 512157

## 2020-01-09 NOTE — Progress Notes (Signed)
Sent a message in mychart regarding the covid drive- thru location change. 

## 2020-01-11 NOTE — Progress Notes (Signed)
Remote ICD transmission.   

## 2020-01-18 ENCOUNTER — Other Ambulatory Visit (HOSPITAL_COMMUNITY)
Admission: RE | Admit: 2020-01-18 | Discharge: 2020-01-18 | Disposition: A | Discharge status: Home / Self Care | Payer: Medicare HMO | Source: Ambulatory Visit | Attending: Gastroenterology | Admitting: Gastroenterology

## 2020-01-18 ENCOUNTER — Telehealth: Payer: Self-pay | Admitting: Gastroenterology

## 2020-01-18 DIAGNOSIS — Z01812 Encounter for preprocedural laboratory examination: Secondary | ICD-10-CM | POA: Diagnosis present

## 2020-01-18 DIAGNOSIS — Z20822 Contact with and (suspected) exposure to covid-19: Secondary | ICD-10-CM | POA: Diagnosis not present

## 2020-01-18 LAB — SARS CORONAVIRUS 2 (TAT 6-24 HRS): SARS Coronavirus 2: NEGATIVE

## 2020-01-18 NOTE — Telephone Encounter (Signed)
Alexander Higgins, RPH-CPP     11/06/19 1:41 PM Note   Patient with diagnosis of afib on Eliquis for anticoagulation.    Procedure: colonoscopy Date of procedure: 01/22/20  CHADS2-VASc score of 3 (age, CHF, HTN)  CrCl 72mL/min using adjusted body weight Platelet count 175K  Per office protocol, patient can hold Eliquis for 2 days prior to procedure.     Spoke with pt and he knows to hold the eliquis for 2 days prior to the procedure.

## 2020-01-18 NOTE — Telephone Encounter (Signed)
Patient needs to know when to stop the Eliquis

## 2020-01-22 ENCOUNTER — Encounter (HOSPITAL_COMMUNITY): Payer: Self-pay | Admitting: Gastroenterology

## 2020-01-22 ENCOUNTER — Ambulatory Visit (HOSPITAL_COMMUNITY): Payer: Medicare HMO | Admitting: Certified Registered Nurse Anesthetist

## 2020-01-22 ENCOUNTER — Encounter (HOSPITAL_COMMUNITY)
Admission: RE | Disposition: A | Discharge status: Home / Self Care | Payer: Self-pay | Source: Home / Self Care | Attending: Gastroenterology

## 2020-01-22 ENCOUNTER — Other Ambulatory Visit: Payer: Self-pay

## 2020-01-22 ENCOUNTER — Ambulatory Visit (HOSPITAL_COMMUNITY)
Admission: RE | Admit: 2020-01-22 | Discharge: 2020-01-22 | Disposition: A | Discharge status: Home / Self Care | Payer: Medicare HMO | Attending: Gastroenterology | Admitting: Gastroenterology

## 2020-01-22 DIAGNOSIS — Z86010 Personal history of colonic polyps: Secondary | ICD-10-CM | POA: Insufficient documentation

## 2020-01-22 DIAGNOSIS — E785 Hyperlipidemia, unspecified: Secondary | ICD-10-CM | POA: Insufficient documentation

## 2020-01-22 DIAGNOSIS — Z6838 Body mass index (BMI) 38.0-38.9, adult: Secondary | ICD-10-CM | POA: Insufficient documentation

## 2020-01-22 DIAGNOSIS — D120 Benign neoplasm of cecum: Secondary | ICD-10-CM | POA: Insufficient documentation

## 2020-01-22 DIAGNOSIS — M1711 Unilateral primary osteoarthritis, right knee: Secondary | ICD-10-CM | POA: Insufficient documentation

## 2020-01-22 DIAGNOSIS — Z1211 Encounter for screening for malignant neoplasm of colon: Secondary | ICD-10-CM | POA: Insufficient documentation

## 2020-01-22 DIAGNOSIS — I110 Hypertensive heart disease with heart failure: Secondary | ICD-10-CM | POA: Diagnosis not present

## 2020-01-22 DIAGNOSIS — M1990 Unspecified osteoarthritis, unspecified site: Secondary | ICD-10-CM | POA: Insufficient documentation

## 2020-01-22 DIAGNOSIS — Q438 Other specified congenital malformations of intestine: Secondary | ICD-10-CM | POA: Diagnosis not present

## 2020-01-22 DIAGNOSIS — K635 Polyp of colon: Secondary | ICD-10-CM

## 2020-01-22 DIAGNOSIS — I428 Other cardiomyopathies: Secondary | ICD-10-CM | POA: Diagnosis not present

## 2020-01-22 DIAGNOSIS — I4891 Unspecified atrial fibrillation: Secondary | ICD-10-CM | POA: Insufficient documentation

## 2020-01-22 DIAGNOSIS — I071 Rheumatic tricuspid insufficiency: Secondary | ICD-10-CM | POA: Insufficient documentation

## 2020-01-22 DIAGNOSIS — E669 Obesity, unspecified: Secondary | ICD-10-CM | POA: Diagnosis not present

## 2020-01-22 DIAGNOSIS — Z7901 Long term (current) use of anticoagulants: Secondary | ICD-10-CM | POA: Diagnosis not present

## 2020-01-22 DIAGNOSIS — I5042 Chronic combined systolic (congestive) and diastolic (congestive) heart failure: Secondary | ICD-10-CM | POA: Diagnosis not present

## 2020-01-22 DIAGNOSIS — K621 Rectal polyp: Secondary | ICD-10-CM | POA: Diagnosis not present

## 2020-01-22 DIAGNOSIS — Z95810 Presence of automatic (implantable) cardiac defibrillator: Secondary | ICD-10-CM | POA: Diagnosis not present

## 2020-01-22 DIAGNOSIS — D122 Benign neoplasm of ascending colon: Secondary | ICD-10-CM | POA: Insufficient documentation

## 2020-01-22 HISTORY — PX: POLYPECTOMY: SHX5525

## 2020-01-22 HISTORY — PX: BIOPSY: SHX5522

## 2020-01-22 HISTORY — PX: COLONOSCOPY WITH PROPOFOL: SHX5780

## 2020-01-22 SURGERY — COLONOSCOPY WITH PROPOFOL
Anesthesia: Monitor Anesthesia Care

## 2020-01-22 MED ORDER — PROPOFOL 500 MG/50ML IV EMUL
INTRAVENOUS | Status: AC
  Filled 2020-01-22: qty 50

## 2020-01-22 MED ORDER — SODIUM CHLORIDE 0.9 % IV SOLN: INTRAVENOUS | Status: DC

## 2020-01-22 MED ORDER — PROPOFOL 10 MG/ML IV BOLUS
INTRAVENOUS | Status: DC | PRN
  Administered 2020-01-22 (×2): 20 mg via INTRAVENOUS
  Administered 2020-01-22: 10 mg via INTRAVENOUS

## 2020-01-22 MED ORDER — PROPOFOL 10 MG/ML IV BOLUS
INTRAVENOUS | Status: AC
  Filled 2020-01-22: qty 20

## 2020-01-22 MED ORDER — PROPOFOL 500 MG/50ML IV EMUL
INTRAVENOUS | Status: DC | PRN
  Administered 2020-01-22: 100 ug/kg/min via INTRAVENOUS

## 2020-01-22 MED ORDER — LACTATED RINGERS IV SOLN
INTRAVENOUS | Status: DC
  Administered 2020-01-22: 1000 mL via INTRAVENOUS

## 2020-01-22 MED ORDER — LIDOCAINE 2% (20 MG/ML) 5 ML SYRINGE
INTRAMUSCULAR | Status: DC | PRN
  Administered 2020-01-22: 100 mg via INTRAVENOUS

## 2020-01-22 SURGICAL SUPPLY — 21 items

## 2020-01-22 NOTE — Anesthesia Preprocedure Evaluation (Addendum)
Anesthesia Evaluation  Patient identified by MRN, date of birth, ID band Patient awake    Reviewed: Allergy & Precautions, NPO status , Patient's Chart, lab work & pertinent test results, reviewed documented beta blocker date and time   Airway Mallampati: III  TM Distance: >3 FB Neck ROM: Full    Dental  (+) Partial Lower, Dental Advisory Given   Pulmonary neg pulmonary ROS,    Pulmonary exam normal breath sounds clear to auscultation       Cardiovascular Exercise Tolerance: Poor hypertension, Pt. on medications and Pt. on home beta blockers +CHF  Normal cardiovascular exam+ dysrhythmias Atrial Fibrillation + Cardiac Defibrillator  Rhythm:Regular Rate:Normal  LVEF 30-35% Non ischemic CM  Echo 05/31/16 Left ventricle: The cavity size was normal. There was moderate concentric hypertrophy. Systolic function was moderately to severely reduced. The estimated ejection fraction was in the range of 30% to 35%. Diffuse hypokinesis. Doppler parameters are consistent with abnormal left ventricular relaxation (grade 1 diastolic dysfunction). The E/e&' ratio is between 8-15, suggesting indeterminate LV filling pressure.  - Mitral valve: Mildly thickened leaflets . There was trivial  regurgitation.  - Left atrium: The atrium was normal in size.  - Right ventricle: The cavity size was mildly dilated. Pacer wire or catheter noted in right ventricle. Systolic function was normal.  - Right atrium: The atrium was mildly dilated. Pacer wire or catheter noted in right atrium.  - Tricuspid valve: There was mild regurgitation.  - Pulmonary arteries: Dilated. PA peak pressure: 17 mm Hg (S).  - Inferior vena cava: The vessel was normal in size. The  respirophasic diameter changes were in the normal range (= 50%), consistent with normal central venous pressure.  - Pericardium, extracardiac: There was no pericardial effusion  EKG 04/06/19 NSR with  sinus arrhymia  Myocardial perfusion study 12/25/14:  No reversible ischemia. Small, mild basal inferior bowel attenuation artifact. Dilated LV - non-gated, therefore EF could not be reported. This is at least an intermediate risk scan based on dilated LV.     Neuro/Psych negative neurological ROS  negative psych ROS   GI/Hepatic Neg liver ROS, Hx/o adenomatous polyps   Endo/Other  Obesity Hyperlipidemia  Renal/GU negative Renal ROS  negative genitourinary   Musculoskeletal  (+) Arthritis , Osteoarthritis,    Abdominal (+) + obese,   Peds  Hematology Eliquis therapy- last dose 01/19/20   Anesthesia Other Findings   Reproductive/Obstetrics                            Anesthesia Physical Anesthesia Plan  ASA: III  Anesthesia Plan: MAC   Post-op Pain Management:    Induction: Intravenous  PONV Risk Score and Plan: Propofol infusion and Treatment may vary due to age or medical condition  Airway Management Planned: Natural Airway and Simple Face Mask  Additional Equipment:   Intra-op Plan:   Post-operative Plan:   Informed Consent: I have reviewed the patients History and Physical, chart, labs and discussed the procedure including the risks, benefits and alternatives for the proposed anesthesia with the patient or authorized representative who has indicated his/her understanding and acceptance.     Dental advisory given  Plan Discussed with: CRNA and Anesthesiologist  Anesthesia Plan Comments:         Anesthesia Quick Evaluation

## 2020-01-22 NOTE — Op Note (Signed)
[facility] Patient Name: Alexander Higgins Procedure Date: 01/22/2020 MRN: [id number] Attending MD: Estill Cotta. Loletha Carrow , MD Date of Birth: 12-Sep-1945 CSN: [id number] Age: 74 Admit Type: Outpatient Procedure:                Colonoscopy Indications:              Surveillance: Personal history of adenomatous                            polyps on last colonoscopy > 5 years ago (39m TA                            and an HP in [facility] Providers:                HEstill Cotta DLoletha Carrow MD, JGlori Bickers RN, Faustina                            Mbumina, Technician, SLazaro Arms Technician Referring MD:             YGarnet Koyanagi MD Medicines:                 Complications:            No immediate complications. Estimated Blood Loss:     Estimated blood loss was minimal. Procedure:                Pre-Anesthesia Assessment:                           - Prior to the procedure, a History and Physical                            was performed, and patient medications and                            allergies were reviewed. The patient's tolerance of                            previous anesthesia was also reviewed. The risks                            and benefits of the procedure and the sedation                            options and risks were discussed with the patient.                            All questions were answered, and informed consent                            was obtained. Prior Anticoagulants: The patient has                            taken Eliquis (apixaban), last dose was 3 days  prior to procedure. ASA Grade Assessment: III - A                            patient with severe systemic disease. After                            reviewing the risks and benefits, the patient was                            deemed in satisfactory condition to undergo the                            procedure.                           After obtaining informed consent, the  colonoscope                            was passed under direct vision. Throughout the                            procedure, the patient's blood pressure, pulse, and                            oxygen saturations were monitored continuously. The                            CF-HQ190L ([id number]) Olympus colonoscope was                            introduced through the anus and advanced to the the                            cecum, identified by appendiceal orifice and                            ileocecal valve. The colonoscopy was performed with                            difficulty due to a redundant colon, significant                            looping and the patient's body habitus. Successful                            completion of the procedure was aided by using                            manual pressure. The patient tolerated the                            procedure. The quality of the bowel preparation was  good. The ileocecal valve, appendiceal orifice, and                            rectum were photographed. Scope In: 8:10:17 AM Scope Out: 9:13:53 AM Scope Withdrawal Time: 0 hours 23 minutes 32 seconds  Total Procedure Duration: 0 hours 27 minutes 36 seconds  Findings:      The perianal and digital rectal examinations were normal.      Two flat polyps were found in the proximal ascending colon and ileocecal       valve. The polyps were diminutive in size. These polyps were removed       with a cold biopsy forceps. Resection and retrieval were complete.      A 6 mm polyp was found in the proximal ascending colon. The polyp was       pedunculated. The polyp was removed with a hot snare. Resection and       retrieval were complete.      A diminutive polyp was found in the rectum. The polyp was sessile. The       polyp was removed with a cold snare. Resection and retrieval were       complete.      The colon (entire examined portion) was redundant.      The  exam was otherwise without abnormality on direct and retroflexion       views. Impression:               - Two diminutive polyps in the proximal ascending                            colon and at the ileocecal valve, removed with a                            cold biopsy forceps. Resected and retrieved.                           - One 6 mm polyp in the proximal ascending colon,                            removed with a hot snare. Resected and retrieved.                           - One diminutive polyp in the rectum, removed with                            a cold snare. Resected and retrieved.                           - Redundant colon.                           - The examination was otherwise normal on direct                            and retroflexion views. Moderate Sedation:      MAC sedation used Recommendation:           - Patient has a  contact number available for                            emergencies. The signs and symptoms of potential                            delayed complications were discussed with the                            patient. Return to normal activities tomorrow.                            Written discharge instructions were provided to the                            patient.                           - Resume previous diet.                           - Resume Eliquis (apixaban) at prior dose in 2 days.                           - Await pathology results.                           - Repeat colonoscopy is recommended for                            surveillance. The colonoscopy date will be                            determined after pathology results from today's                            exam become available for review. Procedure Code(s):        --- Professional ---                           [phone], Colonoscopy, flexible; with removal of                            tumor(s), polyp(s), or other lesion(s) by snare                            technique                            45380, 40, Colonoscopy, flexible; with biopsy,                            single or multiple Diagnosis Code(s):        --- Professional ---                           Z86.010,  Personal history of colonic polyps                           K63.5, Polyp of colon                           K62.1, Rectal polyp                           Q43.8, Other specified congenital malformations of                            intestine CPT copyright 2019 American Medical Association. All rights reserved. The codes documented in this report are preliminary and upon coder review may  be revised to meet current compliance requirements. Ariele Vidrio L. Loletha Carrow, MD 01/22/2020 9:22:40 AM This report has been signed electronically. Number of Addenda: 0

## 2020-01-22 NOTE — Transfer of Care (Signed)
Immediate Anesthesia Transfer of Care Note  Patient: Alexander Higgins  Procedure(s) Performed: COLONOSCOPY WITH PROPOFOL (N/A ) POLYPECTOMY BIOPSY  Patient Location: Endoscopy Unit  Anesthesia Type:MAC  Level of Consciousness: drowsy and patient cooperative  Airway & Oxygen Therapy: Patient Spontanous Breathing and Patient connected to face mask oxygen  Post-op Assessment: Report given to RN and Post -op Vital signs reviewed and stable  Post vital signs: Reviewed and stable  Last Vitals:  Vitals Value Taken Time  BP 107/77 01/22/20 0927  Temp 36.6 C 01/22/20 0927  Pulse 43 01/22/20 0927  Resp 20 01/22/20 0928  SpO2 98 % 01/22/20 0927  Vitals shown include unvalidated device data.  Last Pain:  Vitals:   01/22/20 0927  TempSrc: Oral  PainSc:          Complications: No complications documented.

## 2020-01-22 NOTE — Anesthesia Postprocedure Evaluation (Signed)
Anesthesia Post Note  Patient: Alexander Higgins  Procedure(s) Performed: COLONOSCOPY WITH PROPOFOL (N/A ) POLYPECTOMY BIOPSY     Patient location during evaluation: PACU Anesthesia Type: MAC Level of consciousness: awake and alert and oriented Pain management: pain level controlled Vital Signs Assessment: post-procedure vital signs reviewed and stable Respiratory status: spontaneous breathing, nonlabored ventilation and respiratory function stable Cardiovascular status: stable and blood pressure returned to baseline Postop Assessment: no apparent nausea or vomiting Anesthetic complications: no   No complications documented.  Last Vitals:  Vitals:   01/22/20 0816 01/22/20 0927  BP: (!) 174/94 107/77  Pulse: 84 68  Resp: 17 20  Temp: 37 C 36.6 C  SpO2: 97% 98%    Last Pain:  Vitals:   01/22/20 0927  TempSrc: Oral  PainSc: 0-No pain                 Roslynn Holte A.

## 2020-01-22 NOTE — Discharge Instructions (Signed)
YOU HAD AN ENDOSCOPIC PROCEDURE TODAY: Refer to the procedure report and other information in the discharge instructions given to you for any specific questions about what was found during the examination. If this information does not answer your questions, please call [place] office at [phone] to clarify.   YOU SHOULD EXPECT: Some feelings of bloating in the abdomen. Passage of more gas than usual. Walking can help get rid of the air that was put into your GI tract during the procedure and reduce the bloating. If you had a lower endoscopy (such as a colonoscopy or flexible sigmoidoscopy) you may notice spotting of blood in your stool or on the toilet paper. Some abdominal soreness may be present for a day or two, also.  DIET: Your first meal following the procedure should be a light meal and then it is ok to progress to your normal diet. A half-sandwich or bowl of soup is an example of a good first meal. Heavy or fried foods are harder to digest and may make you feel nauseous or bloated. Drink plenty of fluids but you should avoid alcoholic beverages for 24 hours. If you had a esophageal dilation, please see attached instructions for diet.    ACTIVITY: Your care partner should take you home directly after the procedure. You should plan to take it easy, moving slowly for the rest of the day. You can resume normal activity the day after the procedure however YOU SHOULD NOT DRIVE, use power tools, machinery or perform tasks that involve climbing or major physical exertion for 24 hours (because of the sedation medicines used during the test).   SYMPTOMS TO REPORT IMMEDIATELY: A gastroenterologist can be reached at any hour. Please call [phone]  for any of the following symptoms:  Following lower endoscopy (colonoscopy, flexible sigmoidoscopy) Excessive amounts of blood in the stool  Significant tenderness, worsening of abdominal pains  Swelling of the abdomen that is new, acute  Fever of 100 or  higher   FOLLOW UP:  If any biopsies were taken you will be contacted by phone or by letter within the next 1-3 weeks. Call [phone]  if you have not heard about the biopsies in 3 weeks.  Please also call with any specific questions about appointments or follow up tests.   _____________________________________________  Resume your Eliquis in the morning of 01/24/20 (2 days from now)

## 2020-01-22 NOTE — Interval H&P Note (Signed)
History and Physical Interval Note:  01/22/2020 8:26 AM  Alexander Higgins  has presented today for surgery, with the diagnosis of History of Adenomatous polyps.  The various methods of treatment have been discussed with the patient and family. After consideration of risks, benefits and other options for treatment, the patient has consented to  Procedure(s): COLONOSCOPY WITH PROPOFOL (N/A) as a surgical intervention.  The patient's history has been reviewed, patient examined, no change in status, stable for surgery.  I have reviewed the patient's chart and labs.  Questions were answered to the patient's satisfaction.     [name]

## 2020-01-22 NOTE — H&P (Signed)
History:  This patient presents for endoscopic testing for History of colon polyps  Details in May 2021 [facility] clinic note.  [name] Referring physician: Carollee Herter, Alferd Apa, DO  Past Medical History: Past Medical History:  Diagnosis Date  . AICD (automatic cardioverter/defibrillator) present   . Arthritis    "right knee" (04/23/2015)  . Atrial fibrillation ([place])    a. identified on device check in 07/2016.  Marland Kitchen Chronic combined systolic and diastolic CHF (congestive heart failure) ([place]) dx'd 11/2014   a. 05/2016: echo showing EF of 30-35%, Grade 1 DD, trivial MR, mild TR, PA peak pressure 17 mm Hg.   Marland Kitchen Hyperlipidemia   . Hypertension   . Nonischemic cardiomyopathy ([place])    a. s/p ICD placement in 04/2015     Past Surgical History: Past Surgical History:  Procedure Laterality Date  . EP IMPLANTABLE DEVICE N/A 04/23/2015   Procedure: ICD Implant;  Surgeon: Sanda Klein, MD;  Location: [facility];  Service: Cardiovascular;  Laterality: N/A;  . INSERTION OF ICD  04/23/2015  . TONSILLECTOMY      Allergies: No Known Allergies  Outpatient Meds: Current Facility-Administered Medications  Medication Dose Route Frequency Provider Last Rate Last Admin  . 0.9 %  sodium chloride infusion   Intravenous Continuous Esterwood, Amy S, PA-C      . lactated ringers infusion   Intravenous Continuous Danis, Estill Cotta III, MD          ___________________________________________________________________ Objective   Exam:  BP 174/94,  HR 90s  RR 16   CV: irregular without murmur, S1/S2, no peripheral edema  Resp: clear to auscultation bilaterally, normal RR and effort noted  GI: soft, no tenderness, with active bowel sounds. No guarding or palpable organomegaly noted.  Neuro: awake, alert and oriented x 3. Normal gross motor function and fluent speech   Assessment:  Hx colon polyps Long term use OAC for A fib (last dose 01/19/20)  Plan:  Colonoscopy   [name]

## 2020-01-23 LAB — SURGICAL PATHOLOGY

## 2020-01-24 ENCOUNTER — Encounter: Payer: Self-pay | Admitting: Gastroenterology

## 2020-02-14 ENCOUNTER — Other Ambulatory Visit: Payer: Self-pay | Admitting: Cardiovascular Disease

## 2020-02-15 ENCOUNTER — Other Ambulatory Visit: Payer: Self-pay | Admitting: Pharmacist

## 2020-02-15 NOTE — Telephone Encounter (Signed)
Prescription refill request for Eliquis received. Indication: Atrial Fibrillation Last office visit: 03/2019 Croitoru Scr: 1.09 02/2019 Age: 74 Weight:  129.3 kg  Prescription refilled

## 2020-02-15 NOTE — Telephone Encounter (Signed)
Duplicate request

## 2020-02-16 ENCOUNTER — Other Ambulatory Visit: Payer: Self-pay | Admitting: Cardiovascular Disease

## 2020-03-03 ENCOUNTER — Other Ambulatory Visit: Payer: Self-pay | Admitting: Cardiovascular Disease

## 2020-04-04 NOTE — Progress Notes (Signed)
Patient ID: Alexander Higgins, male   DOB: 10-31-1945, 74 y.o.   MRN: [id number]  no show

## 2020-04-07 ENCOUNTER — Ambulatory Visit (INDEPENDENT_AMBULATORY_CARE_PROVIDER_SITE_OTHER): Payer: Medicare HMO | Admitting: Cardiovascular Disease

## 2020-04-07 DIAGNOSIS — E7800 Pure hypercholesterolemia, unspecified: Secondary | ICD-10-CM

## 2020-04-07 DIAGNOSIS — Z7901 Long term (current) use of anticoagulants: Secondary | ICD-10-CM

## 2020-04-07 DIAGNOSIS — I480 Paroxysmal atrial fibrillation: Secondary | ICD-10-CM

## 2020-04-07 DIAGNOSIS — I5042 Chronic combined systolic (congestive) and diastolic (congestive) heart failure: Secondary | ICD-10-CM

## 2020-04-07 DIAGNOSIS — I10 Essential (primary) hypertension: Secondary | ICD-10-CM

## 2020-04-07 DIAGNOSIS — Z95810 Presence of automatic (implantable) cardiac defibrillator: Secondary | ICD-10-CM

## 2020-04-08 ENCOUNTER — Ambulatory Visit (INDEPENDENT_AMBULATORY_CARE_PROVIDER_SITE_OTHER): Payer: Medicare HMO

## 2020-04-08 DIAGNOSIS — I428 Other cardiomyopathies: Secondary | ICD-10-CM

## 2020-04-08 LAB — CUP PACEART REMOTE DEVICE CHECK
Battery Remaining Longevity: 114 mo
Battery Remaining Percentage: 100 %
Brady Statistic RA Percent Paced: 15 %
Brady Statistic RV Percent Paced: 4 %
Date Time Interrogation Session: 20211026120300
HighPow Impedance: 81 Ohm
Implantable Lead Implant Date: 20161109
Implantable Lead Implant Date: 20161109
Implantable Lead Location: 753859
Implantable Lead Location: 753860
Implantable Lead Model: 293
Implantable Lead Model: 7741
Implantable Lead Serial Number: 385866
Implantable Lead Serial Number: 710559
Implantable Pulse Generator Implant Date: 20161109
Lead Channel Impedance Value: 512 Ohm
Lead Channel Impedance Value: 766 Ohm
Lead Channel Pacing Threshold Amplitude: 0.8 V
Lead Channel Pacing Threshold Amplitude: 0.8 V
Lead Channel Pacing Threshold Pulse Width: 0.4 ms
Lead Channel Pacing Threshold Pulse Width: 0.4 ms
Lead Channel Setting Pacing Amplitude: 2.4 V
Lead Channel Setting Pacing Amplitude: 2.4 V
Lead Channel Setting Pacing Pulse Width: 0.4 ms
Lead Channel Setting Sensing Sensitivity: 0.6 mV
Pulse Gen Serial Number: 512157

## 2020-04-11 NOTE — Progress Notes (Signed)
Remote ICD transmission.   

## 2020-05-05 ENCOUNTER — Encounter: Payer: Self-pay | Admitting: Cardiovascular Disease

## 2020-05-05 ENCOUNTER — Other Ambulatory Visit: Payer: Self-pay

## 2020-05-05 ENCOUNTER — Ambulatory Visit (INDEPENDENT_AMBULATORY_CARE_PROVIDER_SITE_OTHER): Payer: Medicare HMO | Admitting: Cardiovascular Disease

## 2020-05-05 VITALS — BP 118/62 | HR 87 | Ht 72.0 in | Wt 309.8 lb

## 2020-05-05 DIAGNOSIS — I480 Paroxysmal atrial fibrillation: Secondary | ICD-10-CM | POA: Diagnosis not present

## 2020-05-05 DIAGNOSIS — Z7901 Long term (current) use of anticoagulants: Secondary | ICD-10-CM | POA: Diagnosis not present

## 2020-05-05 DIAGNOSIS — E7800 Pure hypercholesterolemia, unspecified: Secondary | ICD-10-CM

## 2020-05-05 DIAGNOSIS — I5042 Chronic combined systolic (congestive) and diastolic (congestive) heart failure: Secondary | ICD-10-CM

## 2020-05-05 DIAGNOSIS — Z95810 Presence of automatic (implantable) cardiac defibrillator: Secondary | ICD-10-CM | POA: Diagnosis not present

## 2020-05-05 DIAGNOSIS — I10 Essential (primary) hypertension: Secondary | ICD-10-CM

## 2020-05-05 NOTE — Progress Notes (Signed)
Patient ID: Alexander Higgins, male   DOB: 03/16/1946, 74 y.o.   MRN: [id number]     Cardiology Office Note    Date:  05/05/2020   ID:  Alexander Higgins, DOB 1946/05/27, MRN [id number]  PCP:  Carollee Herter, Alferd Apa, DO  Cardiologist:   Quay Burow, M.D.; Sanda Klein, MD   Chief complaint:  Follow-up ICD, atrial fibrillation   History of Present Illness:  Alexander Higgins is a 74 y.o. male , roughly 2.5 years status post implantation of a [employer] dual-chamber defibrillator for severe nonischemic cardiomyopathy with a left ventricular ejection fraction of approximately 30%  and symptomatic sinus bradycardia.  He feels well.  He started going back to the gym and complains about being slightly out of shape, but is quickly regaining back his strength and his stamina.  He denies exertional angina or dyspnea, dizziness, palpitations or syncope.  He denies edema, orthopnea, PND.  Intermittently has problems with erectile dysfunction.  He has not had any defibrillator discharges.  Denies any falls, injuries or bleeding.  He is on maximum tolerated doses of Entresto and carvedilol and does not require loop diuretics.  He is compliant with anticoagulant and statin therapy.  Pacemaker interrogation shows normal device function.  His device is at beginning of life with an anticipated longevity of about 10 years.  He continues to have a low burden of atrial fibrillation, less than 1%, but the episodes can be lengthy (most recently 5 hours of atrial fibrillation on November 19, well rate controlled).  In addition he has occasional episodes of nonsustained ventricular tachycardia, up to 20 beats in duration, once or twice a month.  The most recent event occurred in November 12 and is likewise asymptomatic.  He is having a lot of premature atrial contractions during his office visit today, but is unaware of these as well.   Past Medical History:  Diagnosis Date  . AICD (automatic  cardioverter/defibrillator) present   . Arthritis    "right knee" (04/23/2015)  . Atrial fibrillation ([place])    a. identified on device check in 07/2016.  Marland Kitchen Chronic combined systolic and diastolic CHF (congestive heart failure) ([place]) dx'd 11/2014   a. 05/2016: echo showing EF of 30-35%, Grade 1 DD, trivial MR, mild TR, PA peak pressure 17 mm Hg.   Marland Kitchen Hyperlipidemia   . Hypertension   . Nonischemic cardiomyopathy ([place])    a. s/p ICD placement in 04/2015    Past Surgical History:  Procedure Laterality Date  . BIOPSY  01/22/2020   Procedure: BIOPSY;  Surgeon: Doran Stabler, MD;  Location: [facility];  Service: Gastroenterology;;  . COLONOSCOPY WITH PROPOFOL N/A 01/22/2020   Procedure: COLONOSCOPY WITH PROPOFOL;  Surgeon: Doran Stabler, MD;  Location: [facility];  Service: Gastroenterology;  Laterality: N/A;  . EP IMPLANTABLE DEVICE N/A 04/23/2015   Procedure: ICD Implant;  Surgeon: Sanda Klein, MD;  Location: [facility];  Service: Cardiovascular;  Laterality: N/A;  . INSERTION OF ICD  04/23/2015  . POLYPECTOMY  01/22/2020   Procedure: POLYPECTOMY;  Surgeon: Doran Stabler, MD;  Location: [facility];  Service: Gastroenterology;;  . TONSILLECTOMY      Current Outpatient Medications  Medication Sig Dispense Refill  . apixaban (ELIQUIS) 5 MG TABS tablet Take 1 tablet (5 mg total) by mouth 2 (two) times daily. 180 tablet 0  . carvedilol (COREG) 12.5 MG tablet Take 1 tablet (12.5 mg total) by mouth 2 (two) times daily with a meal.  180 tablet 3  . furosemide (LASIX) 40 MG tablet Take 1 tablet (40 mg total) by mouth 2 (two) times daily. 180 tablet 3  . rosuvastatin (CRESTOR) 10 MG tablet Take 1 tablet (10 mg total) by mouth at bedtime. 90 tablet 3  . sacubitril-valsartan (ENTRESTO) 97-103 MG Take 1 tablet by mouth 2 (two) times daily. 180 tablet 1  . sildenafil (REVATIO) 20 MG tablet Take 1 tablet (20 mg total) by mouth 3 (three) times daily. 90 tablet 0   No current  facility-administered medications for this visit.    Allergies:   Patient has no known allergies.   Social History   Socioeconomic History  . Marital status: Divorced    Spouse name: Not on file  . Number of children: 5  . Years of education: Not on file  . Highest education level: Not on file  Occupational History  . Not on file  Tobacco Use  . Smoking status: Never Smoker  . Smokeless tobacco: Never Used  Vaping Use  . Vaping Use: Never used  Substance and Sexual Activity  . Alcohol use: No    Alcohol/week: 0.0 standard drinks  . Drug use: No  . Sexual activity: Yes  Other Topics Concern  . Not on file  Social History Narrative   Exercise in gym every day   Social Determinants of Health   Financial Resource Strain:   . Difficulty of Paying Living Expenses: Not on file  Food Insecurity:   . Worried About Charity fundraiser in the Last Year: Not on file  . Ran Out of Food in the Last Year: Not on file  Transportation Needs:   . Lack of Transportation (Medical): Not on file  . Lack of Transportation (Non-Medical): Not on file  Physical Activity:   . Days of Exercise per Week: Not on file  . Minutes of Exercise per Session: Not on file  Stress:   . Feeling of Stress : Not on file  Social Connections:   . Frequency of Communication with Friends and Family: Not on file  . Frequency of Social Gatherings with Friends and Family: Not on file  . Attends Religious Services: Not on file  . Active Member of Clubs or Organizations: Not on file  . Attends Archivist Meetings: Not on file  . Marital Status: Not on file     Family History:  The patient's family history includes Heart attack in his brother; Heart disease in his mother; Hypertension in his brother and mother; Stroke in his sister.   ROS:   Please see the history of present illness.    ROS All other systems are reviewed and are negative.  PHYSICAL EXAM:   VS:  BP 118/62   Pulse 87   Ht 6'  (1.829 m)   Wt (!) 309 lb 12.8 oz (140.5 kg)   SpO2 96%   BMI 42.02 kg/m      General: Alert, oriented x3, no distress, morbidly obese, but also quite muscular.  Healthy left subclavian defibrillator site. Head: no evidence of trauma, PERRL, EOMI, no exophtalmos or lid lag, no myxedema, no xanthelasma; normal ears, nose and oropharynx Neck: normal jugular venous pulsations and no hepatojugular reflux; brisk carotid pulses without delay and no carotid bruits Chest: clear to auscultation, no signs of consolidation by percussion or palpation, normal fremitus, symmetrical and full respiratory excursions Cardiovascular: normal position and quality of the apical impulse, regular rhythm, normal first and second heart sounds, no murmurs,  rubs or gallops Abdomen: no tenderness or distention, no masses by palpation, no abnormal pulsatility or arterial bruits, normal bowel sounds, no hepatosplenomegaly Extremities: no clubbing, cyanosis or edema; 2+ radial, ulnar and brachial pulses bilaterally; 2+ right femoral, posterior tibial and dorsalis pedis pulses; 2+ left femoral, posterior tibial and dorsalis pedis pulses; no subclavian or femoral bruits Neurological: grossly nonfocal Psych: Normal mood and affect   Wt Readings from Last 3 Encounters:  05/05/20 (!) 309 lb 12.8 oz (140.5 kg)  01/22/20 285 lb (129.3 kg)  11/06/19 (!) 307 lb 12.8 oz (139.6 kg)      Studies/Labs Reviewed:   EKG:  EKG is not ordered today.  The intracardiac electrogram shows sinus rhythm (atrial sensed, ventricular sensed rhythm) with frequent premature atrial complexes.  Recent Labs: No results found for requested labs within last 8760 hours.   Lipid Panel    Component Value Date/Time   CHOL 204 (H) 02/15/2019 1443   TRIG 154.0 (H) 02/15/2019 1443   HDL 36.80 (L) 02/15/2019 1443   CHOLHDL 6 02/15/2019 1443   VLDL 30.8 02/15/2019 1443   LDLCALC 136 (H) 02/15/2019 1443      ASSESSMENT:    1. Chronic combined  systolic and diastolic CHF (congestive heart failure) ([facility])   2. Paroxysmal atrial fibrillation ([place])   3. ICD (implantable cardioverter-defibrillator) in place   4. Long term current use of anticoagulant   5. Essential hypertension   6. Hypercholesteremia      PLAN:  In order of problems listed above:  1. CHF: Nonischemic cardiomyopathy with most recent LVEF 30-35%.  NYHA functional class I, euvolemic without loop diuretics. 2. ICD: Normal device function.  He has occasional nonsustained VT and has never required therapy.  Continue remote downloads every 3 months. 3. Afib: Paroxysmal events are asymptomatic and well rate controlled.  Overall burden is very low at 2% or less.. CHADSVasc 3 (age, HF, HTN), on anticoagulant. 4. Eliquis: No bleeding complications 5. HTN: Controlled 6. HLP: On statin.  I do not have a recent lipid profile. 7. ED: Has heard about other, implantable devices that can help with this problem.  Advised discussing this with his urologist    Medication Adjustments/Labs and Tests Ordered: Current medicines are reviewed at length with the patient today.  Concerns regarding medicines are outlined above.  Medication changes, Labs and Tests ordered today are listed below. Patient Instructions  Medication Instructions:  No changes *If you need a refill on your cardiac medications before your next appointment, please call your pharmacy*   Lab Work: None ordered If you have labs (blood work) drawn today and your tests are completely normal, you will receive your results only by: Marland Kitchen MyChart Message (if you have MyChart) OR . A paper copy in the mail If you have any lab test that is abnormal or we need to change your treatment, we will call you to review the results.   Testing/Procedures: None ordered   Follow-Up: At [facility], you and your health needs are our priority.  As part of our continuing mission to provide you with exceptional heart care, we have  created designated Provider Care Teams.  These Care Teams include your primary Cardiologist (physician) and Advanced Practice Providers (APPs -  Physician Assistants and Nurse Practitioners) who all work together to provide you with the care you need, when you need it.  We recommend signing up for the patient portal called "MyChart".  Sign up information is provided on this After Visit Summary.  MyChart is used to connect with patients for Virtual Visits (Telemedicine).  Patients are able to view lab/test results, encounter notes, upcoming appointments, etc.  Non-urgent messages can be sent to your provider as well.   To learn more about what you can do with MyChart, go to [email or web address].    Your next appointment:   12 month(s)  The format for your next appointment:   In Person  Provider:   Sanda Klein, MD       Signed, Sanda Klein, MD  05/05/2020 1:57 PM    [facility] [place], [place], [place]  [address] Phone: [phone]; Fax: [phone]

## 2020-05-05 NOTE — Patient Instructions (Signed)

## 2020-05-21 ENCOUNTER — Other Ambulatory Visit: Payer: Self-pay | Admitting: Cardiovascular Disease

## 2020-05-28 ENCOUNTER — Other Ambulatory Visit: Payer: Self-pay | Admitting: Cardiovascular Disease

## 2020-05-28 ENCOUNTER — Telehealth (INDEPENDENT_AMBULATORY_CARE_PROVIDER_SITE_OTHER): Payer: Medicare HMO | Admitting: Family Medicine

## 2020-05-28 ENCOUNTER — Encounter: Payer: Self-pay | Admitting: Family Medicine

## 2020-05-28 ENCOUNTER — Other Ambulatory Visit: Payer: Self-pay

## 2020-05-28 DIAGNOSIS — J40 Bronchitis, not specified as acute or chronic: Secondary | ICD-10-CM | POA: Diagnosis not present

## 2020-05-28 MED ORDER — AZITHROMYCIN 250 MG PO TABS: ORAL_TABLET | ORAL | 0 refills | Status: DC

## 2020-05-28 NOTE — Progress Notes (Signed)
Virtual Visit via Video Note  I connected with Alexander Higgins on 05/28/20 at  9:00 AM EST by a video enabled telemedicine application and verified that I am speaking with the correct person using two identifiers.  Location/ persons in visit  Patient: home alone  Provider: home    I discussed the limitations of evaluation and management by telemedicine and the availability of in person appointments. The patient expressed understanding and agreed to proceed.  History of Present Illness: Pt is home c/o productive cough, dec appetitie  No fever or body aches --- x 3 days   + nasal congestion  Pt has been taking robitussin with some relief  We needed to change to telephone visit due to pt having I phone    Observations/Objective: There were no vitals filed for this visit. Pt with no fevers Pt is in nad   Assessment and Plan: 1. Bronchitis con't robitussin  Pt will get covid test today Avoid decongestants or ibuprofen  - azithromycin (ZITHROMAX Z-PAK) 250 MG tablet; As directed  Dispense: 6 each; Refill: 0   Follow Up Instructions:    I discussed the assessment and treatment plan with the patient. The patient was provided an opportunity to ask questions and all were answered. The patient agreed with the plan and demonstrated an understanding of the instructions.   The patient was advised to call back or seek an in-person evaluation if the symptoms worsen or if the condition fails to improve as anticipated.  I provided 25 minutes of non-face-to-face time during this encounter.   Ann Held, DO

## 2020-07-08 ENCOUNTER — Ambulatory Visit (INDEPENDENT_AMBULATORY_CARE_PROVIDER_SITE_OTHER): Payer: Medicare Other

## 2020-07-08 DIAGNOSIS — I480 Paroxysmal atrial fibrillation: Secondary | ICD-10-CM

## 2020-07-11 LAB — CUP PACEART REMOTE DEVICE CHECK
Battery Remaining Longevity: 102 mo
Battery Remaining Percentage: 100 %
Brady Statistic RA Percent Paced: 12 %
Brady Statistic RV Percent Paced: 3 %
Date Time Interrogation Session: 20220127162000
HighPow Impedance: 78 Ohm
Implantable Lead Implant Date: 20161109
Implantable Lead Implant Date: 20161109
Implantable Lead Location: 753859
Implantable Lead Location: 753860
Implantable Lead Model: 293
Implantable Lead Model: 7741
Implantable Lead Serial Number: 385866
Implantable Lead Serial Number: 710559
Implantable Pulse Generator Implant Date: 20161109
Lead Channel Impedance Value: 467 Ohm
Lead Channel Impedance Value: 778 Ohm
Lead Channel Pacing Threshold Amplitude: 1.1 V
Lead Channel Pacing Threshold Amplitude: 1.3 V
Lead Channel Pacing Threshold Pulse Width: 0.4 ms
Lead Channel Pacing Threshold Pulse Width: 0.4 ms
Lead Channel Setting Pacing Amplitude: 2.4 V
Lead Channel Setting Pacing Amplitude: 2.4 V
Lead Channel Setting Pacing Pulse Width: 0.4 ms
Lead Channel Setting Sensing Sensitivity: 0.6 mV
Pulse Gen Serial Number: 512157

## 2020-07-21 NOTE — Progress Notes (Signed)
Remote ICD transmission.   

## 2020-07-28 ENCOUNTER — Other Ambulatory Visit: Payer: Self-pay | Admitting: Cardiovascular Disease

## 2020-08-28 ENCOUNTER — Other Ambulatory Visit: Payer: Self-pay | Admitting: Cardiovascular Disease

## 2020-09-03 ENCOUNTER — Other Ambulatory Visit: Payer: Self-pay | Admitting: Cardiovascular Disease

## 2020-10-07 ENCOUNTER — Ambulatory Visit (INDEPENDENT_AMBULATORY_CARE_PROVIDER_SITE_OTHER): Payer: Medicare Other

## 2020-10-07 DIAGNOSIS — I428 Other cardiomyopathies: Secondary | ICD-10-CM | POA: Diagnosis not present

## 2020-10-07 LAB — CUP PACEART REMOTE DEVICE CHECK
Battery Remaining Longevity: 102 mo
Battery Remaining Percentage: 100 %
Brady Statistic RA Percent Paced: 12 %
Brady Statistic RV Percent Paced: 3 %
Date Time Interrogation Session: 20220426103300
HighPow Impedance: 81 Ohm
Implantable Lead Implant Date: 20161109
Implantable Lead Implant Date: 20161109
Implantable Lead Location: 753859
Implantable Lead Location: 753860
Implantable Lead Model: 293
Implantable Lead Model: 7741
Implantable Lead Serial Number: 385866
Implantable Lead Serial Number: 710559
Implantable Pulse Generator Implant Date: 20161109
Lead Channel Impedance Value: 461 Ohm
Lead Channel Impedance Value: 766 Ohm
Lead Channel Pacing Threshold Amplitude: 1.1 V
Lead Channel Pacing Threshold Amplitude: 1.3 V
Lead Channel Pacing Threshold Pulse Width: 0.4 ms
Lead Channel Pacing Threshold Pulse Width: 0.4 ms
Lead Channel Setting Pacing Amplitude: 2.4 V
Lead Channel Setting Pacing Amplitude: 2.4 V
Lead Channel Setting Pacing Pulse Width: 0.4 ms
Lead Channel Setting Sensing Sensitivity: 0.6 mV
Pulse Gen Serial Number: 512157

## 2020-10-28 NOTE — Progress Notes (Signed)
Remote ICD transmission.   

## 2020-12-02 ENCOUNTER — Other Ambulatory Visit: Payer: Self-pay | Admitting: Cardiovascular Disease

## 2020-12-02 DIAGNOSIS — I480 Paroxysmal atrial fibrillation: Secondary | ICD-10-CM

## 2020-12-03 NOTE — Telephone Encounter (Signed)
55M, 140.5KG, Creatinine, Serum 1.090 mg/ 02/15/2019 , LOVW/CROITORU 05/05/20. REFILL SENT FOR ONE MONTH W/INSTRUCTIONS TO COMPLETE FASTING LABS

## 2021-01-06 ENCOUNTER — Ambulatory Visit: Payer: Medicare HMO

## 2021-01-07 ENCOUNTER — Other Ambulatory Visit: Payer: Self-pay | Admitting: Cardiovascular Disease

## 2021-01-07 ENCOUNTER — Telehealth: Payer: Self-pay | Admitting: Cardiovascular Disease

## 2021-01-07 NOTE — Telephone Encounter (Signed)
New Message:    Patient said they would not use the discount from his phone for his Eliquis. They told him he need something else from this office to get the discount.

## 2021-01-07 NOTE — Telephone Encounter (Signed)
Called patient, he states pharmacy told him that he needed to have an actual card to use and not the barcode on his phone. He would like to come pick this up from here and try this.  I will place one up front for him to pick up.   Patient verbalized understanding.

## 2021-01-07 NOTE — Telephone Encounter (Signed)
Prescription refill request for Eliquis received. Indication:afib Last office visit:croitoru 05/05/20 Scr:1.090 mg/ 02/15/2019 overdue  Age: 56mWWeight:63.5kg

## 2021-02-08 ENCOUNTER — Other Ambulatory Visit: Payer: Self-pay | Admitting: Cardiovascular Disease

## 2021-02-09 NOTE — Telephone Encounter (Signed)
Prescription refill request for Eliquis received. Indication:atrial fib Last office visit:1/22 RO:[id number] labs Age: 75 Weight:140.5 kg  Prescription refilled

## 2021-02-13 NOTE — Telephone Encounter (Signed)
Returned the call to the patient. He stated that he is in the donut hole right now and has to spend $7000.   Eliquis samples have been provided as well as assistance papers for both the entresto and eliquis.  Medication Samples have been provided to the patient.  Drug name: Eliquis       Strength: '5mg'$          Qty: 3 bottles  LOT: [id number]  Exp.Date: 01/2023

## 2021-02-13 NOTE — Telephone Encounter (Signed)
Patient states his pharmacy won't let him use the discount card because he is on medicaid. He would also like a discount for entresto.

## 2021-03-20 ENCOUNTER — Telehealth: Payer: Self-pay | Admitting: *Deleted

## 2021-03-20 NOTE — Telephone Encounter (Signed)
Assistance papers have been faxed for Eliquis and Entresto

## 2021-03-31 ENCOUNTER — Other Ambulatory Visit: Payer: Self-pay | Admitting: Cardiovascular Disease

## 2021-04-01 NOTE — Telephone Encounter (Signed)
Prescription refill request for Eliquis received. Indication:afib Last office visit:croitoru 05/05/20 Scr: 1.090 mg/ 02/15/2019 Age: 74m Weight:140.5kg

## 2021-04-02 ENCOUNTER — Encounter: Payer: Self-pay | Admitting: Cardiovascular Disease

## 2021-04-02 NOTE — Telephone Encounter (Signed)
error 

## 2021-04-03 LAB — COMPREHENSIVE METABOLIC PANEL
ALT: 24 IU/L (ref 0–44)
AST: 25 IU/L (ref 0–40)
Albumin/Globulin Ratio: 1.2 (ref 1.2–2.2)
Albumin: 4.1 g/dL (ref 3.7–4.7)
Alkaline Phosphatase: 58 IU/L (ref 44–121)
BUN/Creatinine Ratio: 12 (ref 10–24)
BUN: 13 mg/dL (ref 8–27)
Bilirubin Total: 0.5 mg/dL (ref 0.0–1.2)
CO2: 22 mmol/L (ref 20–29)
Calcium: 9.2 mg/dL (ref 8.6–10.2)
Chloride: 104 mmol/L (ref 96–106)
Creatinine, Ser: 1.12 mg/dL (ref 0.76–1.27)
Globulin, Total: 3.3 g/dL (ref 1.5–4.5)
Glucose: 133 mg/dL — ABNORMAL HIGH (ref 70–99)
Potassium: 3.7 mmol/L (ref 3.5–5.2)
Sodium: 141 mmol/L (ref 134–144)
Total Protein: 7.4 g/dL (ref 6.0–8.5)
eGFR: 69 mL/min/{1.73_m2} (ref >59)

## 2021-04-07 ENCOUNTER — Ambulatory Visit: Payer: Medicare HMO

## 2021-04-07 ENCOUNTER — Encounter: Payer: Self-pay | Admitting: *Deleted

## 2021-04-23 ENCOUNTER — Ambulatory Visit: Payer: Medicare Other | Admitting: Family Medicine

## 2021-05-08 ENCOUNTER — Other Ambulatory Visit: Payer: Self-pay | Admitting: Cardiovascular Disease

## 2021-05-08 ENCOUNTER — Other Ambulatory Visit: Payer: Self-pay | Admitting: Nurse Practitioner

## 2021-05-08 MED ORDER — APIXABAN 5 MG PO TABS: ORAL_TABLET | ORAL | 0 refills | Status: DC

## 2021-05-08 NOTE — Telephone Encounter (Signed)
   Patient called to report that he is about to run out of Eliquis.  This was refilled last month with recommendation that he schedule appointment for future refills.  Patient says he was unaware of the additional script on the prescription.  I told him I would fill for 30 days but that he will need to call back into the office on Monday to make a follow-up with Dr. Sallyanne Kuster or APP.  Caller verbalized understanding and was grateful for the call back.  CBC    Component Value Date/Time   WBC 7.1 02/15/2019 1443   RBC 5.13 02/15/2019 1443   HGB 16.2 02/15/2019 1443   HCT 47.9 02/15/2019 1443   PLT 175.0 02/15/2019 1443   MCV 93.3 02/15/2019 1443   MCH 31.6 04/21/2015 0928   MCHC 33.8 02/15/2019 1443   RDW 14.4 02/15/2019 1443   LYMPHSABS 2.4 02/15/2019 1443   MONOABS 0.8 02/15/2019 1443   EOSABS 0.2 02/15/2019 1443   BASOSABS 0.1 02/15/2019 1443   Lab Results  Component Value Date   CREATININE 1.12 04/02/2021   BUN 13 04/02/2021   NA 141 04/02/2021   K 3.7 04/02/2021   CL 104 04/02/2021   CO2 22 04/02/2021       Murray Hodgkins, NP 05/08/2021, 2:17 PM

## 2021-06-05 NOTE — Telephone Encounter (Signed)
The patient has been approved for assistance with Entresto through 06/03/2022

## 2021-06-12 ENCOUNTER — Other Ambulatory Visit: Payer: Self-pay | Admitting: Nurse Practitioner

## 2021-06-12 NOTE — Telephone Encounter (Signed)
Eliquis 5 mg refill request received. Patient is 75 years old, weight-140.5 kg, Crea- 1.2 on 04/02/21, Diagnosis-PAF, and last seen by Dr. Sallyanne Kuster on 05/05/20. Dose is appropriate based on dosing criteria, but pt is overdue to see provider.  Will send in 30 day supply and route to scheduling.

## 2021-06-14 ENCOUNTER — Other Ambulatory Visit: Payer: Self-pay | Admitting: Cardiovascular Disease

## 2021-07-01 ENCOUNTER — Telehealth: Payer: Self-pay | Admitting: Cardiovascular Disease

## 2021-07-01 MED ORDER — ENTRESTO 97-103 MG PO TABS: 1.0000 | ORAL_TABLET | Freq: Two times a day (BID) | ORAL | 2 refills | Status: DC

## 2021-07-01 NOTE — Telephone Encounter (Signed)
Informed pt that we do not get samples for the 97/103 dose. Pt states that he is just waiting for his medication that has been approved until 05/2022. Sent refill to requested pharmacy

## 2021-07-01 NOTE — Telephone Encounter (Signed)
Pt c/o medication issue:  1. Name of Medication: ENTRESTO 97-103 MG  2. How are you currently taking this medication (dosage and times per day)? Take 1 tablet by mouth twice daily  3. Are you having a reaction (difficulty breathing--STAT)? 0  4. What is your medication issue? Calling to see if he can get samples, until he is able to get through the assistant program that helps with getting medication. He states that there is a part that he still needs to fill out, which is how will the medication be giving. Please advise

## 2021-07-13 ENCOUNTER — Other Ambulatory Visit: Payer: Self-pay | Admitting: Cardiovascular Disease

## 2021-07-15 ENCOUNTER — Other Ambulatory Visit: Payer: Self-pay | Admitting: Cardiovascular Disease

## 2021-07-15 NOTE — Telephone Encounter (Signed)
Prescription refill request for Eliquis received. Indication:Afib Last office visit:Needs Appointment Scr:1.1 Age: 76 Weight:140.5 kg  Prescription refilled

## 2021-07-20 ENCOUNTER — Telehealth: Payer: Self-pay | Admitting: Cardiovascular Disease

## 2021-07-20 NOTE — Telephone Encounter (Signed)
Spoke with pt pharmacy, the patient was given an script for paxlovid and there is concern because they are on eliquis. The prescribing doctor told them to call us. Aware will forward to our pharm md to review.

## 2021-07-20 NOTE — Telephone Encounter (Signed)
Paxlovid interacts with many of his medications. He needs to decrease his Eliquis dose by 50% and only take 2.5mg  BID while he takes Paxlovid. Sildenafil is also contraindicated with Paxlovid (not clear if he's taking this as needed for ED since it's written as TID dosing which would be for pulmonary hypertension but I do not see a diagnosis of this, just ED). He also needs to stop his rosuvastatin while he takes Paxlovid.

## 2021-07-20 NOTE — Telephone Encounter (Signed)
Spoke with pt, aware and repeated the medication changes while on the covid medication. Pharmacy made aware.

## 2021-07-20 NOTE — Telephone Encounter (Signed)
Pharmacy calling to ask about patient's medication and the prescribed medication that urgent care had prescribed him

## 2021-08-04 ENCOUNTER — Other Ambulatory Visit: Payer: Self-pay | Admitting: Cardiovascular Disease

## 2021-08-04 NOTE — Telephone Encounter (Signed)
Prescription refill request for Eliquis received. Indication: afib  Last office visit: Croitoru, 05/05/2020 Scr: 1.12, 04/02/2021 Age: 77 yo  Weight: 140.5 kg   Pt is suppose to see Dr Sallyanne Kuster on 2/27. Will send in refill  so pt does not run out.

## 2021-08-10 ENCOUNTER — Encounter: Payer: Self-pay | Admitting: Cardiovascular Disease

## 2021-08-10 ENCOUNTER — Ambulatory Visit (INDEPENDENT_AMBULATORY_CARE_PROVIDER_SITE_OTHER): Payer: Medicare HMO | Admitting: Cardiovascular Disease

## 2021-08-10 ENCOUNTER — Other Ambulatory Visit: Payer: Self-pay

## 2021-08-10 VITALS — BP 122/68 | HR 65 | Ht 72.0 in | Wt 311.8 lb

## 2021-08-10 DIAGNOSIS — I480 Paroxysmal atrial fibrillation: Secondary | ICD-10-CM | POA: Diagnosis not present

## 2021-08-10 DIAGNOSIS — I4729 Other ventricular tachycardia: Secondary | ICD-10-CM

## 2021-08-10 DIAGNOSIS — Z95810 Presence of automatic (implantable) cardiac defibrillator: Secondary | ICD-10-CM | POA: Diagnosis not present

## 2021-08-10 DIAGNOSIS — I5042 Chronic combined systolic (congestive) and diastolic (congestive) heart failure: Secondary | ICD-10-CM | POA: Diagnosis not present

## 2021-08-10 DIAGNOSIS — I10 Essential (primary) hypertension: Secondary | ICD-10-CM

## 2021-08-10 DIAGNOSIS — D6869 Other thrombophilia: Secondary | ICD-10-CM

## 2021-08-10 DIAGNOSIS — E7800 Pure hypercholesterolemia, unspecified: Secondary | ICD-10-CM

## 2021-08-10 MED ORDER — FUROSEMIDE 40 MG PO TABS: 40.0000 mg | ORAL_TABLET | Freq: Every day | ORAL | 0 refills | Status: DC

## 2021-08-10 MED ORDER — EMPAGLIFLOZIN 10 MG PO TABS: 10.0000 mg | ORAL_TABLET | Freq: Every day | ORAL | 0 refills | Status: DC

## 2021-08-10 NOTE — Patient Instructions (Signed)
Medication Instructions:  DECREASE the Furosemide to 40 mg once daily  START Jardiance 10 mg once daily  *If you need a refill on your cardiac medications before your next appointment, please call your pharmacy*   Lab Work: None ordered If you have labs (blood work) drawn today and your tests are completely normal, you will receive your results only by: [place] (if you have MyChart) OR A paper copy in the mail If you have any lab test that is abnormal or we need to change your treatment, we will call you to review the results.   Testing/Procedures: None ordered   Follow-Up: At [facility], you and your health needs are our priority.  As part of our continuing mission to provide you with exceptional heart care, we have created designated Provider Care Teams.  These Care Teams include your primary Cardiologist (physician) and Advanced Practice Providers (APPs -  Physician Assistants and Nurse Practitioners) who all work together to provide you with the care you need, when you need it.  We recommend signing up for the patient portal called "MyChart".  Sign up information is provided on this After Visit Summary.  MyChart is used to connect with patients for Virtual Visits (Telemedicine).  Patients are able to view lab/test results, encounter notes, upcoming appointments, etc.  Non-urgent messages can be sent to your provider as well.   To learn more about what you can do with MyChart, go to [email or web address].    Your next appointment:   3 month(s)  The format for your next appointment:   In Person  Provider:   Sanda Klein, MD {

## 2021-08-13 ENCOUNTER — Encounter: Payer: Self-pay | Admitting: Cardiovascular Disease

## 2021-08-13 NOTE — Progress Notes (Signed)
Patient ID: Alexander Higgins, male   DOB: Oct 02, 1945, 76 y.o.   MRN: [id number]     Cardiology Office Note    Date:  08/13/2021   ID:  Alexander Higgins, DOB January 23, 1946, MRN [id number]  PCP:  Carollee Herter, Alferd Apa, DO  Cardiologist:   Quay Burow, M.D.; Sanda Klein, MD   Chief complaint:  Follow-up ICD, atrial fibrillation   History of Present Illness:  Alexander Higgins is a 76 y.o. male , roughly 2.5 years status post implantation of a [employer] dual-chamber defibrillator for severe nonischemic cardiomyopathy with a left ventricular ejection fraction of approximately 30%  and symptomatic sinus bradycardia.  He is doing well.  He is going to the gym on a regular basis, 3-4 times a week, 2 hours each time.  He has little difficulty with weights, but develops dyspnea when he is walking.  Overall appears to be NYHA functional class II.  He denies defibrillator discharges, dizziness or syncope or palpitations.  Not had edema, orthopnea or PND.  Has not noticed major weight changes.  Has not had chest pain at rest or with activity.  His defibrillator is a [facility] implanted in 2017 that still has about 8 years of anticipated generator longevity.  He has 14% atrial pacing and 5% ventricular pacing.  He had a very brief episode of paroxysmal atrial fibrillation that lasted for only 20 seconds on February 14.  He has had roughly 2 episodes of nonsustained ventricular tachycardia a month on the average, most recently an 11 beat episode and 15 beat episode that occurred on 07/25/2021.  All of these have been asymptomatic.  Lead parameters are all normal.   Past Medical History:  Diagnosis Date   AICD (automatic cardioverter/defibrillator) present    Arthritis    "right knee" (04/23/2015)   Atrial fibrillation ([place])    a. identified on device check in 07/2016.   Chronic combined systolic and diastolic CHF (congestive heart failure) ([place]) dx'd 11/2014   a. 05/2016: echo  showing EF of 30-35%, Grade 1 DD, trivial MR, mild TR, PA peak pressure 17 mm Hg.    Hyperlipidemia    Hypertension    Nonischemic cardiomyopathy ([place])    a. s/p ICD placement in 04/2015    Past Surgical History:  Procedure Laterality Date   BIOPSY  01/22/2020   Procedure: BIOPSY;  Surgeon: Doran Stabler, MD;  Location: [facility];  Service: Gastroenterology;;   COLONOSCOPY WITH PROPOFOL N/A 01/22/2020   Procedure: COLONOSCOPY WITH PROPOFOL;  Surgeon: Doran Stabler, MD;  Location: [facility];  Service: Gastroenterology;  Laterality: N/A;   EP IMPLANTABLE DEVICE N/A 04/23/2015   Procedure: ICD Implant;  Surgeon: Sanda Klein, MD;  Location: [facility];  Service: Cardiovascular;  Laterality: N/A;   INSERTION OF ICD  04/23/2015   POLYPECTOMY  01/22/2020   Procedure: POLYPECTOMY;  Surgeon: Doran Stabler, MD;  Location: [facility];  Service: Gastroenterology;;   TONSILLECTOMY      Current Outpatient Medications  Medication Sig Dispense Refill   apixaban (ELIQUIS) 5 MG TABS tablet TAKE 1 TABLET BY MOUTH TWICE DAILY . APPOINTMENT REQUIRED FOR FUTURE REFILLS 60 tablet 0   carvedilol (COREG) 12.5 MG tablet TAKE 1 TABLET BY MOUTH TWICE DAILY WITH MEALS 60 tablet 0   empagliflozin (JARDIANCE) 10 MG TABS tablet Take 1 tablet (10 mg total) by mouth daily. 90 tablet 0   rosuvastatin (CRESTOR) 10 MG tablet TAKE 1 TABLET BY MOUTH AT  BEDTIME 90 tablet 0   sacubitril-valsartan (ENTRESTO) 97-103 MG Take 1 tablet by mouth 2 (two) times daily. 180 tablet 2   sildenafil (REVATIO) 20 MG tablet Take 1 tablet (20 mg total) by mouth 3 (three) times daily. 90 tablet 0   azithromycin (ZITHROMAX Z-PAK) 250 MG tablet As directed (Patient not taking: Reported on 08/10/2021) 6 each 0   furosemide (LASIX) 40 MG tablet Take 1 tablet (40 mg total) by mouth daily. 90 tablet 0   No current facility-administered medications for this visit.    Allergies:   Patient has no known allergies.    Social History   Socioeconomic History   Marital status: Divorced    Spouse name: Not on file   Number of children: 5   Years of education: Not on file   Highest education level: Not on file  Occupational History   Not on file  Tobacco Use   Smoking status: Never   Smokeless tobacco: Never  Vaping Use   Vaping Use: Never used  Substance and Sexual Activity   Alcohol use: No    Alcohol/week: 0.0 standard drinks   Drug use: No   Sexual activity: Yes  Other Topics Concern   Not on file  Social History Narrative   Exercise in gym every day   Social Determinants of Health   Financial Resource Strain: Not on file  Food Insecurity: Not on file  Transportation Needs: Not on file  Physical Activity: Not on file  Stress: Not on file  Social Connections: Not on file     Family History:  The patient's family history includes Heart attack in his brother; Heart disease in his mother; Hypertension in his brother and mother; Stroke in his sister.   ROS:   Please see the history of present illness.    ROS All other systems are reviewed and are negative.  PHYSICAL EXAM:   VS:  BP 122/68    Pulse 65    Ht 6' (1.829 m)    Wt (!) 311 lb 12.8 oz (141.4 kg)    SpO2 94%    BMI 42.29 kg/m      General: Alert, oriented x3, no distress, he is very muscular and fit appearing but is also morbidly obese.  Healthy left subclavian defibrillator site. Head: no evidence of trauma, PERRL, EOMI, no exophtalmos or lid lag, no myxedema, no xanthelasma; normal ears, nose and oropharynx Neck: normal jugular venous pulsations and no hepatojugular reflux; brisk carotid pulses without delay and no carotid bruits Chest: clear to auscultation, no signs of consolidation by percussion or palpation, normal fremitus, symmetrical and full respiratory excursions Cardiovascular: normal position and quality of the apical impulse, regular rhythm with rare ectopic beats, normal first and second heart sounds, no  murmurs, rubs or gallops Abdomen: no tenderness or distention, no masses by palpation, no abnormal pulsatility or arterial bruits, normal bowel sounds, no hepatosplenomegaly Extremities: no clubbing, cyanosis or edema; 2+ radial, ulnar and brachial pulses bilaterally; 2+ right femoral, posterior tibial and dorsalis pedis pulses; 2+ left femoral, posterior tibial and dorsalis pedis pulses; no subclavian or femoral bruits Neurological: grossly nonfocal Psych: Normal mood and affect    Wt Readings from Last 3 Encounters:  08/10/21 (!) 311 lb 12.8 oz (141.4 kg)  05/05/20 (!) 309 lb 12.8 oz (140.5 kg)  01/22/20 285 lb (129.3 kg)      Studies/Labs Reviewed:   EKG:  EKG is ordered today.  It shows normal sinus rhythm and is a  normal tracing.  Recent Labs: 04/02/2021: ALT 24; BUN 13; Creatinine, Ser 1.12; Potassium 3.7; Sodium 141   Lipid Panel    Component Value Date/Time   CHOL 204 (H) 02/15/2019 1443   TRIG 154.0 (H) 02/15/2019 1443   HDL 36.80 (L) 02/15/2019 1443   CHOLHDL 6 02/15/2019 1443   VLDL 30.8 02/15/2019 1443   LDLCALC 136 (H) 02/15/2019 1443      ASSESSMENT:    1. Chronic combined systolic and diastolic CHF (congestive heart failure) ([place])   2. ICD (implantable cardioverter-defibrillator) in place   3. Paroxysmal atrial fibrillation ([facility])   4. NSVT (nonsustained ventricular tachycardia)   5. Acquired thrombophilia ([place])   6. Essential hypertension   7. Hypercholesteremia      PLAN:  In order of problems listed above:  CHF: Nonischemic cardiomyopathy with most recent LVEF 30-35% but has not been reevaluated since 2017.  NYHA functional class I, euvolemic without loop diuretics.  NYHA functional class I-2 on a relatively low-dose of loop diuretic.  Add Jardiance and decrease the dose of furosemide.  Discussed the potential for increased risk for groin yeast infections and a low risk of a very serious perineal infection, he should report any signs of inflammation  in this area promptly.  He is already on carvedilol, maximum dose Entresto.  Consider adding spironolactone as well, if blood pressure permits. ICD: Normal ICD function.  He has never required device therapies.  Continue remote downloads every 3 months. Afib: Very infrequent and brief episodes of paroxysmal atrial fibrillation, consistently asymptomatic. CHADSVasc 3 (age, HF, HTN), on anticoagulant. NSVT: Occasional episodes once or twice a month, consistently brief and asymptomatic.  Has never required therapy. Eliquis: Tolerated without bleeding complications HTN: Controlled HLP: Needs an updated lipid profile.    Medication Adjustments/Labs and Tests Ordered: Current medicines are reviewed at length with the patient today.  Concerns regarding medicines are outlined above.  Medication changes, Labs and Tests ordered today are listed below. Patient Instructions  Medication Instructions:  DECREASE the Furosemide to 40 mg once daily  START Jardiance 10 mg once daily  *If you need a refill on your cardiac medications before your next appointment, please call your pharmacy*   Lab Work: None ordered If you have labs (blood work) drawn today and your tests are completely normal, you will receive your results only by: [place] (if you have MyChart) OR A paper copy in the mail If you have any lab test that is abnormal or we need to change your treatment, we will call you to review the results.   Testing/Procedures: None ordered   Follow-Up: At [facility], you and your health needs are our priority.  As part of our continuing mission to provide you with exceptional heart care, we have created designated Provider Care Teams.  These Care Teams include your primary Cardiologist (physician) and Advanced Practice Providers (APPs -  Physician Assistants and Nurse Practitioners) who all work together to provide you with the care you need, when you need it.  We recommend signing up for  the patient portal called "MyChart".  Sign up information is provided on this After Visit Summary.  MyChart is used to connect with patients for Virtual Visits (Telemedicine).  Patients are able to view lab/test results, encounter notes, upcoming appointments, etc.  Non-urgent messages can be sent to your provider as well.   To learn more about what you can do with MyChart, go to [email or web address].    Your next appointment:  3 month(s)  The format for your next appointment:   In Person  Provider:   Sanda Klein, MD {      Signed, Sanda Klein, MD  08/13/2021 5:36 PM    [address], [place], [place]  [address] Phone: [phone]; Fax: [phone]

## 2021-08-15 ENCOUNTER — Other Ambulatory Visit: Payer: Self-pay | Admitting: Cardiovascular Disease

## 2021-08-26 ENCOUNTER — Other Ambulatory Visit: Payer: Self-pay | Admitting: Cardiovascular Disease

## 2021-08-27 ENCOUNTER — Telehealth: Payer: Self-pay | Admitting: Cardiovascular Disease

## 2021-08-27 NOTE — Telephone Encounter (Signed)
?*  STAT* If patient is at the pharmacy, call can be transferred to refill team. ? ? ?1. Which medications need to be refilled? (please list name of each medication and dose if known)  ?furosemide (LASIX) 40 MG tablet ?apixaban (ELIQUIS) 5 MG TABS tablet ? ?2. Which pharmacy/location (including street and city if local pharmacy) is medication to be sent to? ? ?[facility] - [address] ? ?3. Do they need a 30 day or 90 day supply?  ?90 day supply ? ? ?Patient states the pharmacy is needing an authorization from our office prior to distributing refills. This may be due to the note attached, stating an appointment is needed, but patient was seen on 2/27 by Dr. Sallyanne Kuster. Please assist. ?

## 2021-08-28 ENCOUNTER — Other Ambulatory Visit: Payer: Self-pay | Admitting: Cardiovascular Disease

## 2021-08-28 MED ORDER — APIXABAN 5 MG PO TABS: ORAL_TABLET | ORAL | 0 refills | Status: DC

## 2021-08-28 NOTE — Telephone Encounter (Signed)
On 08/10/2021 Dr Sallyanne Kuster DECREASE Furosemide to 40 mg once daily. ?

## 2021-08-28 NOTE — Telephone Encounter (Signed)
Prescription refill request for Eliquis received. ?Indication:Afib ?Last office visit:2/23 ?Scr:1.1 ?Age: 76 ?Weight:141.4 kg ? ?Prescription refilled ? ?

## 2021-09-04 ENCOUNTER — Telehealth: Payer: Self-pay | Admitting: Cardiovascular Disease

## 2021-09-04 MED ORDER — FUROSEMIDE 40 MG PO TABS: 40.0000 mg | ORAL_TABLET | Freq: Every day | ORAL | 3 refills | Status: DC

## 2021-09-04 NOTE — Telephone Encounter (Signed)
Patient called stating he wasn't able to get the Lasix, the pharmacy told him the insurance won't cover it as the the new medication was the same as lasix.  He stated someone has to call the insurance company and let them know he needs lasix, that he can't take the new medication.  ?

## 2021-09-04 NOTE — Telephone Encounter (Signed)
Spoke to pharmacist at [facility] in [place].She stated she told patient mail order pharmacy mailed Lasix 40 mg prescription.She stated he told her he never received.Advised he needs to take Lasix 40 mg daily.She will get prescription ready for patient. ? ?Called patient left message on personal voice mail I spoke to pharmacist at [facility] she will get Lasix prescription ready.You can pick up this afternoon.Advised to start back taking Lasix 40 mg daily.Advised Lasix and Jardiance are not the same.Advised to hold Jardiance due to itching and swelling.Dr.Croitoru is out of office.I will make him aware. ? ?

## 2021-09-04 NOTE — Telephone Encounter (Signed)
Pt c/o medication issue: ? ?1. Name of Medication:  ? empagliflozin (JARDIANCE)   ? ? ?2. How are you currently taking this medication (dosage and times per day)? Take 1 tablet (10 mg total) by mouth daily ? ?3. Are you having a reaction (difficulty breathing--STAT)? No ? ?4. What is your medication issue? Pt states that he has itching and swelling in his genital. He would like for nurse to call ?

## 2021-09-04 NOTE — Telephone Encounter (Signed)
Spoke to patient he stated he has been having genital itching and scrotum has been swelling for the past 3 days.Advised to hold Jardiance.Stated he stopped Lasix when he started [place].Advised Lasix was decreased to 40 mg daily.He will restart Lasix 40 mg daily.Advised I will send message to Dr.Croitoru. ?

## 2021-09-07 NOTE — Telephone Encounter (Signed)
I agree with stopping Jardiance. He will need to resume high dose of furosemide '40mg'$  twice a day since Jardiance is being stopped. ?

## 2021-09-08 MED ORDER — FUROSEMIDE 40 MG PO TABS: 40.0000 mg | ORAL_TABLET | Freq: Two times a day (BID) | ORAL | 3 refills | Status: DC

## 2021-09-08 NOTE — Telephone Encounter (Signed)
The patient stated that he did restart the Furosemide at 40 mg bid. The itching and irration have stopped since discontinuing the Jardiance. ? ?He has been advised that we will call back next when Dr. Sallyanne Kuster returns for any other recommendations.  ?

## 2021-09-08 NOTE — Addendum Note (Signed)
Addended by: Ricci Barker on: 09/08/2021 10:06 AM ? ? Modules accepted: Orders ? ?

## 2021-09-17 ENCOUNTER — Encounter: Payer: Self-pay | Admitting: Cardiovascular Disease

## 2021-09-17 ENCOUNTER — Other Ambulatory Visit: Payer: Self-pay | Admitting: Cardiovascular Disease

## 2021-09-17 NOTE — Telephone Encounter (Signed)
error 

## 2021-09-19 NOTE — Telephone Encounter (Signed)
Will stay off Jardiance and stay on the dose of furosemide that has worked for him in the past. ?

## 2021-09-25 ENCOUNTER — Other Ambulatory Visit: Payer: Self-pay | Admitting: Cardiovascular Disease

## 2021-10-06 ENCOUNTER — Ambulatory Visit (INDEPENDENT_AMBULATORY_CARE_PROVIDER_SITE_OTHER): Payer: Medicare HMO

## 2021-10-06 DIAGNOSIS — I428 Other cardiomyopathies: Secondary | ICD-10-CM | POA: Diagnosis not present

## 2021-10-06 LAB — CUP PACEART REMOTE DEVICE CHECK
Battery Remaining Longevity: 96 mo
Battery Remaining Percentage: 100 %
Brady Statistic RA Percent Paced: 19 %
Brady Statistic RV Percent Paced: 6 %
Date Time Interrogation Session: 20230425041500
HighPow Impedance: 76 Ohm
Implantable Lead Implant Date: 20161109
Implantable Lead Implant Date: 20161109
Implantable Lead Location: 753859
Implantable Lead Location: 753860
Implantable Lead Model: 293
Implantable Lead Model: 7741
Implantable Lead Serial Number: 385866
Implantable Lead Serial Number: 710559
Implantable Pulse Generator Implant Date: 20161109
Lead Channel Impedance Value: 479 Ohm
Lead Channel Impedance Value: 753 Ohm
Lead Channel Pacing Threshold Amplitude: 1.5 V
Lead Channel Pacing Threshold Amplitude: 1.7 V
Lead Channel Pacing Threshold Pulse Width: 0.4 ms
Lead Channel Pacing Threshold Pulse Width: 0.4 ms
Lead Channel Setting Pacing Amplitude: 2.4 V
Lead Channel Setting Pacing Amplitude: 2.4 V
Lead Channel Setting Pacing Pulse Width: 0.4 ms
Lead Channel Setting Sensing Sensitivity: 0.6 mV
Pulse Gen Serial Number: 512157

## 2021-10-15 ENCOUNTER — Other Ambulatory Visit: Payer: Self-pay | Admitting: Cardiovascular Disease

## 2021-10-21 NOTE — Progress Notes (Signed)
Remote ICD transmission.   

## 2021-10-26 ENCOUNTER — Encounter: Payer: Medicaid Other | Admitting: Cardiovascular Disease

## 2021-10-29 ENCOUNTER — Telehealth: Payer: Self-pay | Admitting: Family Medicine

## 2021-10-29 NOTE — Telephone Encounter (Signed)
I called patient to schedule AWV.  Patient said he's seeing a new PCP, Alexander Higgins.  Patient said new PCP is closer to his home. Please remove PCP.

## 2021-12-09 ENCOUNTER — Other Ambulatory Visit: Payer: Self-pay | Admitting: Cardiovascular Disease

## 2021-12-09 DIAGNOSIS — I480 Paroxysmal atrial fibrillation: Secondary | ICD-10-CM

## 2021-12-09 NOTE — Telephone Encounter (Signed)
Prescription refill request for Eliquis received. Indication: Afib  Last office visit: 08/10/21 (Croitoru) Scr: 1.20 (09/09/21)  Age: 76 Weight: 141.1kg  Appropriate dose and refill sent to requested pharmacy.

## 2021-12-18 ENCOUNTER — Other Ambulatory Visit: Payer: Self-pay | Admitting: Cardiovascular Disease

## 2022-01-05 ENCOUNTER — Ambulatory Visit (INDEPENDENT_AMBULATORY_CARE_PROVIDER_SITE_OTHER): Payer: Medicare HMO

## 2022-01-05 DIAGNOSIS — I428 Other cardiomyopathies: Secondary | ICD-10-CM

## 2022-01-05 LAB — CUP PACEART REMOTE DEVICE CHECK
Battery Remaining Longevity: 90 mo
Battery Remaining Percentage: 100 %
Brady Statistic RA Percent Paced: 14 %
Brady Statistic RV Percent Paced: 5 %
Date Time Interrogation Session: 20230725100500
HighPow Impedance: 83 Ohm
Implantable Lead Implant Date: 20161109
Implantable Lead Implant Date: 20161109
Implantable Lead Location: 753859
Implantable Lead Location: 753860
Implantable Lead Model: 293
Implantable Lead Model: 7741
Implantable Lead Serial Number: 385866
Implantable Lead Serial Number: 710559
Implantable Pulse Generator Implant Date: 20161109
Lead Channel Impedance Value: 547 Ohm
Lead Channel Impedance Value: 778 Ohm
Lead Channel Pacing Threshold Amplitude: 1.5 V
Lead Channel Pacing Threshold Amplitude: 1.7 V
Lead Channel Pacing Threshold Pulse Width: 0.4 ms
Lead Channel Pacing Threshold Pulse Width: 0.4 ms
Lead Channel Setting Pacing Amplitude: 2.4 V
Lead Channel Setting Pacing Amplitude: 2.4 V
Lead Channel Setting Pacing Pulse Width: 0.4 ms
Lead Channel Setting Sensing Sensitivity: 0.6 mV
Pulse Gen Serial Number: 512157

## 2022-02-01 NOTE — Progress Notes (Signed)
Remote ICD transmission.   

## 2022-02-02 ENCOUNTER — Telehealth: Payer: Self-pay | Admitting: Cardiovascular Disease

## 2022-02-02 NOTE — Telephone Encounter (Signed)
  Pt c/o swelling: STAT is pt has developed SOB within 24 hours  If swelling, where is the swelling located? abdomen  How much weight have you gained and in what time span? Not sure  Have you gained 3 pounds in a day or 5 pounds in a week? Not sure  Do you have a log of your daily weights (if so, list)? Not sure  Are you currently taking a fluid pill? Yes   Are you currently SOB? A little bit   Have you traveled recently? No   Pt's daughter calling, she is concern about pt's abdomen. She said it really big and even though pt goes to gym he is not loosing weight. Pt also sweating a lot

## 2022-02-02 NOTE — Telephone Encounter (Signed)
Spoke to patient's daughter [name].Stated she is concerned about father.Stated he perspires heavy with the least exertion.Stomach tight and distended.He is sob.Swelling in lower legs and feet.His weight is up to 300 lbs.Stated he does not complain.She requested appointment with Dr.Croitoru.She does not want father to know she called.Appointment scheduled with Dr.Croitoru 8/28 at 8:40 am.She will be coming to appointment.

## 2022-02-08 ENCOUNTER — Ambulatory Visit: Payer: Medicare HMO | Attending: Cardiovascular Disease | Admitting: Cardiovascular Disease

## 2022-02-08 ENCOUNTER — Encounter: Payer: Self-pay | Admitting: Cardiovascular Disease

## 2022-02-08 VITALS — BP 114/72 | HR 87 | Ht 72.0 in | Wt 306.0 lb

## 2022-02-08 DIAGNOSIS — I480 Paroxysmal atrial fibrillation: Secondary | ICD-10-CM | POA: Diagnosis not present

## 2022-02-08 DIAGNOSIS — Z95810 Presence of automatic (implantable) cardiac defibrillator: Secondary | ICD-10-CM

## 2022-02-08 DIAGNOSIS — I4729 Other ventricular tachycardia: Secondary | ICD-10-CM | POA: Diagnosis not present

## 2022-02-08 DIAGNOSIS — I10 Essential (primary) hypertension: Secondary | ICD-10-CM

## 2022-02-08 DIAGNOSIS — D6869 Other thrombophilia: Secondary | ICD-10-CM

## 2022-02-08 DIAGNOSIS — I5042 Chronic combined systolic (congestive) and diastolic (congestive) heart failure: Secondary | ICD-10-CM

## 2022-02-08 NOTE — Patient Instructions (Signed)
Medication Instructions:  No changes *If you need a refill on your cardiac medications before your next appointment, please call your pharmacy*   Lab Work: None ordered If you have labs (blood work) drawn today and your tests are completely normal, you will receive your results only by: MyChart Message (if you have MyChart) OR A paper copy in the mail If you have any lab test that is abnormal or we need to change your treatment, we will call you to review the results.   Testing/Procedures: None ordered   Follow-Up: At [facility], you and your health needs are our priority.  As part of our continuing mission to provide you with exceptional heart care, we have created designated Provider Care Teams.  These Care Teams include your primary Cardiologist (physician) and Advanced Practice Providers (APPs -  Physician Assistants and Nurse Practitioners) who all work together to provide you with the care you need, when you need it.  We recommend signing up for the patient portal called "MyChart".  Sign up information is provided on this After Visit Summary.  MyChart is used to connect with patients for Virtual Visits (Telemedicine).  Patients are able to view lab/test results, encounter notes, upcoming appointments, etc.  Non-urgent messages can be sent to your provider as well.   To learn more about what you can do with MyChart, go to [email or web address].    Your next appointment:   12 month(s)  The format for your next appointment:   In Person  Provider:   Mihai Croitoru, MD      Important Information About Sugar       

## 2022-02-08 NOTE — Progress Notes (Signed)
Patient ID: Alexander Higgins, male   DOB: Jan 01, 1946, 76 y.o.   MRN: [id number]     Cardiology Office Note    Date:  02/09/2022   ID:  Alexander Higgins, DOB May 02, 1946, MRN [id number]  PCP:  Jene Every, MD  Cardiologist:   Quay Burow, M.D.; Sanda Klein, MD   Chief complaint:  Follow-up ICD, atrial fibrillation   History of Present Illness:  Alexander Higgins is a 76 y.o. male , morbidly obese, roughly 5 years status post implantation of a [employer] dual-chamber defibrillator for severe nonischemic cardiomyopathy with a left ventricular ejection fraction of approximately 30%  and symptomatic sinus bradycardia.  He is generally doing well.  He still goes to a gym about 3-4 times a week.  He feels that if he takes a break for more than a couple of weeks he finds it hard to go back to his previous level of activity for quite a while.  Otherwise he does not feel that his stamina or energy level have decreased.  He denies issues with shortness of breath or chest pain either at rest or with activity.  He has not had palpitations or syncope.  His daughter points out that he sweats a lot, but the patient states this has been lifelong condition.  He does not have lower extremity edema, orthopnea or PND.  He has not had defibrillator discharges, falls, bleeding or serious injuries.  Interrogation of his device shows normal function.  He does not require pacing.  The device recorded a 25 beat run of nonsustained ventricular tachycardia that occurred on August 8 around midnight.  It looks monomorphic and had a "warm up" pattern reaching a peak cycle length of 300 ms.  The device has also recorded an episode of 15 beat nonsustained atrial tachycardia with variable AV block.  Estimated generator longevity is over 7 years.  Lead parameters are good.   Past Medical History:  Diagnosis Date   AICD (automatic cardioverter/defibrillator) present    Arthritis    "right knee" (04/23/2015)   Atrial  fibrillation ([place])    a. identified on device check in 07/2016.   Chronic combined systolic and diastolic CHF (congestive heart failure) ([place]) dx'd 11/2014   a. 05/2016: echo showing EF of 30-35%, Grade 1 DD, trivial MR, mild TR, PA peak pressure 17 mm Hg.    Hyperlipidemia    Hypertension    Nonischemic cardiomyopathy ([place])    a. s/p ICD placement in 04/2015    Past Surgical History:  Procedure Laterality Date   BIOPSY  01/22/2020   Procedure: BIOPSY;  Surgeon: Doran Stabler, MD;  Location: [facility];  Service: Gastroenterology;;   COLONOSCOPY WITH PROPOFOL N/A 01/22/2020   Procedure: COLONOSCOPY WITH PROPOFOL;  Surgeon: Doran Stabler, MD;  Location: [facility];  Service: Gastroenterology;  Laterality: N/A;   EP IMPLANTABLE DEVICE N/A 04/23/2015   Procedure: ICD Implant;  Surgeon: Sanda Klein, MD;  Location: [facility];  Service: Cardiovascular;  Laterality: N/A;   INSERTION OF ICD  04/23/2015   POLYPECTOMY  01/22/2020   Procedure: POLYPECTOMY;  Surgeon: Doran Stabler, MD;  Location: [facility];  Service: Gastroenterology;;   TONSILLECTOMY      Current Outpatient Medications  Medication Sig Dispense Refill   apixaban (ELIQUIS) 5 MG TABS tablet Take 1 tablet by mouth twice daily 180 tablet 1   carvedilol (COREG) 12.5 MG tablet TAKE 1 TABLET BY MOUTH TWICE DAILY WITH MEALS . APPOINTMENT REQUIRED FOR FUTURE  REFILLS 60 tablet 11   clotrimazole (LOTRIMIN) 1 % cream Apply topically 2 (two) times daily.     diclofenac Sodium (VOLTAREN) 1 % GEL Apply 2 g topically 4 (four) times daily.     furosemide (LASIX) 40 MG tablet Take 1 tablet by mouth twice daily 180 tablet 3   rosuvastatin (CRESTOR) 10 MG tablet TAKE 1 TABLET BY MOUTH AT BEDTIME 90 tablet 0   sildenafil (REVATIO) 20 MG tablet Take 1 tablet (20 mg total) by mouth 3 (three) times daily. 90 tablet 0   sacubitril-valsartan (ENTRESTO) 97-103 MG Take 1 tablet by mouth 2 (two) times daily. (Patient not taking:  Reported on 02/08/2022) 180 tablet 2   No current facility-administered medications for this visit.    Allergies:   Jardiance [empagliflozin]   Social History   Socioeconomic History   Marital status: Divorced    Spouse name: Not on file   Number of children: 5   Years of education: Not on file   Highest education level: Not on file  Occupational History   Not on file  Tobacco Use   Smoking status: Never   Smokeless tobacco: Never  Vaping Use   Vaping Use: Never used  Substance and Sexual Activity   Alcohol use: No    Alcohol/week: 0.0 standard drinks of alcohol   Drug use: No   Sexual activity: Yes  Other Topics Concern   Not on file  Social History Narrative   Exercise in gym every day   Social Determinants of Health   Financial Resource Strain: Not on file  Food Insecurity: Not on file  Transportation Needs: Not on file  Physical Activity: Not on file  Stress: Not on file  Social Connections: Not on file     Family History:  The patient's family history includes Heart attack in his brother; Heart disease in his mother; Hypertension in his brother and mother; Stroke in his sister.   ROS:   Please see the history of present illness.    ROS All other systems are reviewed and are negative.  PHYSICAL EXAM:   VS:  BP 114/72 (BP Location: Left Arm, Patient Position: Sitting, Cuff Size: Large)   Pulse 87   Ht 6' (1.829 m)   Wt (!) 306 lb (138.8 kg)   SpO2 90%   BMI 41.50 kg/m      General: Alert, oriented x3, no distress, he is very muscular and fit appearing but is also morbidly obese.  Healthy left subclavian defibrillator site. Head: no evidence of trauma, PERRL, EOMI, no exophtalmos or lid lag, no myxedema, no xanthelasma; normal ears, nose and oropharynx Neck: normal jugular venous pulsations and no hepatojugular reflux; brisk carotid pulses without delay and no carotid bruits Chest: clear to auscultation, no signs of consolidation by percussion or  palpation, normal fremitus, symmetrical and full respiratory excursions Cardiovascular: normal position and quality of the apical impulse, regular rhythm with rare ectopic beats, normal first and second heart sounds, no murmurs, rubs or gallops Abdomen: no tenderness or distention, no masses by palpation, no abnormal pulsatility or arterial bruits, normal bowel sounds, no hepatosplenomegaly Extremities: no clubbing, cyanosis or edema; 2+ radial, ulnar and brachial pulses bilaterally; 2+ right femoral, posterior tibial and dorsalis pedis pulses; 2+ left femoral, posterior tibial and dorsalis pedis pulses; no subclavian or femoral bruits Neurological: grossly nonfocal Psych: Normal mood and affect    Wt Readings from Last 3 Encounters:  02/08/22 (!) 306 lb (138.8 kg)  08/10/21 (!) 311  lb 12.8 oz (141.4 kg)  05/05/20 (!) 309 lb 12.8 oz (140.5 kg)      Studies/Labs Reviewed:   EKG:  EKG is ordered today.  It shows normal sinus rhythm and is a completely normal tracing.  Recent Labs: 04/02/2021: ALT 24; BUN 13; Creatinine, Ser 1.12; Potassium 3.7; Sodium 141   Lipid Panel    Component Value Date/Time   CHOL 204 (H) 02/15/2019 1443   TRIG 154.0 (H) 02/15/2019 1443   HDL 36.80 (L) 02/15/2019 1443   CHOLHDL 6 02/15/2019 1443   VLDL 30.8 02/15/2019 1443   LDLCALC 136 (H) 02/15/2019 1443      ASSESSMENT:    1. Chronic combined systolic and diastolic CHF (congestive heart failure) ([place])   2. ICD (implantable cardioverter-defibrillator) in place   3. Paroxysmal atrial fibrillation ([facility])   4. NSVT (nonsustained ventricular tachycardia) ([place])   5. Acquired thrombophilia ([place])   6. Essential hypertension      PLAN:  In order of problems listed above:  CHF: NYHA functional class I and euvolemic on a moderate dose of loop diuretic.  Receiving the maximum tolerated dose of Entresto and carvedilol.  Did not tolerate SGLT2 inhibitors due to increased irritation in the groin and genital  area.  Nonischemic cardiomyopathy with most recent LVEF 30-35% but has not been reevaluated since 2017.  Consider adding spironolactone as well, if blood pressure permits. ICD: ICD function.  Remote downloads every 3 months. Afib: None has been recorded recently.  Burden of atrial fibrillation is very low and has always been asymptomatic.  He did have 1 recent episode of brief atrial tachycardia.  CHADSVasc 3 (age, HF, HTN), on anticoagulant. NSVT: Occasional episodes once or twice a month, consistently brief and asymptomatic.  This month he had a particularly long episode lasting for 25 beats that resolved spontaneously.  He has never required treatment from his defibrillator for ventricular arrhythmia. Eliquis: No bleeding complications. HTN: Well-controlled.  Not sure will allow mineralocorticoid antagonist.     Medication Adjustments/Labs and Tests Ordered: Current medicines are reviewed at length with the patient today.  Concerns regarding medicines are outlined above.  Medication changes, Labs and Tests ordered today are listed below. Patient Instructions  Medication Instructions:  No changes *If you need a refill on your cardiac medications before your next appointment, please call your pharmacy*   Lab Work: None ordered If you have labs (blood work) drawn today and your tests are completely normal, you will receive your results only by: [place] (if you have MyChart) OR A paper copy in the mail If you have any lab test that is abnormal or we need to change your treatment, we will call you to review the results.   Testing/Procedures: None ordered   Follow-Up: At [facility], you and your health needs are our priority.  As part of our continuing mission to provide you with exceptional heart care, we have created designated Provider Care Teams.  These Care Teams include your primary Cardiologist (physician) and Advanced Practice Providers (APPs -  Physician  Assistants and Nurse Practitioners) who all work together to provide you with the care you need, when you need it.  We recommend signing up for the patient portal called "MyChart".  Sign up information is provided on this After Visit Summary.  MyChart is used to connect with patients for Virtual Visits (Telemedicine).  Patients are able to view lab/test results, encounter notes, upcoming appointments, etc.  Non-urgent messages can be sent to your  provider as well.   To learn more about what you can do with MyChart, go to [email or web address].    Your next appointment:   12 month(s)  The format for your next appointment:   In Person  Provider:   Sanda Klein, MD     Important Information About Sugar          Signed, Sanda Klein, MD  02/09/2022 8:39 AM    [address], [place], [place]  [address] Phone: [phone]; Fax: [phone]

## 2022-02-09 ENCOUNTER — Encounter: Payer: Self-pay | Admitting: Cardiovascular Disease

## 2022-04-06 ENCOUNTER — Other Ambulatory Visit: Payer: Self-pay | Admitting: Cardiovascular Disease

## 2022-04-06 ENCOUNTER — Ambulatory Visit (INDEPENDENT_AMBULATORY_CARE_PROVIDER_SITE_OTHER): Payer: Medicare PPO

## 2022-04-06 DIAGNOSIS — I428 Other cardiomyopathies: Secondary | ICD-10-CM

## 2022-04-07 LAB — CUP PACEART REMOTE DEVICE CHECK
Battery Remaining Longevity: 84 mo
Battery Remaining Percentage: 94 %
Brady Statistic RA Percent Paced: 19 %
Brady Statistic RV Percent Paced: 5 %
Date Time Interrogation Session: 20231024062700
HighPow Impedance: 81 Ohm
Implantable Lead Connection Status: 753985
Implantable Lead Connection Status: 753985
Implantable Lead Implant Date: 20161109
Implantable Lead Implant Date: 20161109
Implantable Lead Location: 753859
Implantable Lead Location: 753860
Implantable Lead Model: 293
Implantable Lead Model: 7741
Implantable Lead Serial Number: 385866
Implantable Lead Serial Number: 710559
Implantable Pulse Generator Implant Date: 20161109
Lead Channel Impedance Value: 442 Ohm
Lead Channel Impedance Value: 744 Ohm
Lead Channel Pacing Threshold Amplitude: 0.1 V
Lead Channel Pacing Threshold Amplitude: 1.2 V
Lead Channel Pacing Threshold Pulse Width: 0.4 ms
Lead Channel Pacing Threshold Pulse Width: 0.4 ms
Lead Channel Setting Pacing Amplitude: 2.4 V
Lead Channel Setting Pacing Amplitude: 2.4 V
Lead Channel Setting Pacing Pulse Width: 0.4 ms
Lead Channel Setting Sensing Sensitivity: 0.6 mV
Pulse Gen Serial Number: 512157
Zone Setting Status: 755011

## 2022-04-20 ENCOUNTER — Telehealth: Payer: Self-pay | Admitting: Cardiovascular Disease

## 2022-04-20 NOTE — Telephone Encounter (Signed)
Spoke with pt he need to re-enroll for pt assistance for Entresto and Eliquis. Filled out both pt assistance forms an put at the front desk for pt completion. Pt will come pick up sometime after noon.

## 2022-04-20 NOTE — Telephone Encounter (Signed)
Pt c/o medication issue:  1. Name of Medication: sacubitril-valsartan (ENTRESTO) 97-103 MG   2. How are you currently taking this medication (dosage and times per day)? 1 tablet twice a day  3. Are you having a reaction (difficulty breathing--STAT)? no  4. What is your medication issue? Patient states he needs to speak with a nurse about his [employer] application for entresto assistance.

## 2022-04-26 NOTE — Progress Notes (Signed)
Remote ICD transmission.   

## 2022-05-10 ENCOUNTER — Other Ambulatory Visit: Payer: Self-pay | Admitting: Cardiovascular Disease

## 2022-05-10 MED ORDER — ENTRESTO 97-103 MG PO TABS: 1.0000 | ORAL_TABLET | Freq: Two times a day (BID) | ORAL | 3 refills | Status: DC

## 2022-05-10 NOTE — Telephone Encounter (Signed)
*  STAT* If patient is at the pharmacy, call can be transferred to refill team.   1. Which medications need to be refilled? (please list name of each medication and dose if known) need a new prescription for Entrestot  2. Which pharmacy/location (including street and city if local pharmacy) is medication to be sent to?[employer] Patient Assistance Program Fax#  [phone]  Phone#  [phone]  3. Do they need a 30 day or 90 day supply? 90 days and refills

## 2022-05-13 MED ORDER — ENTRESTO 97-103 MG PO TABS: 1.0000 | ORAL_TABLET | Freq: Two times a day (BID) | ORAL | 3 refills | Status: DC

## 2022-05-13 NOTE — Addendum Note (Signed)
Addended by: Betha Loa F on: 05/13/2022 03:26 PM   Modules accepted: Orders

## 2022-05-13 NOTE — Telephone Encounter (Signed)
Entresto order faxed. Patient advised. Patient stated he has been off med x 1 week. At this time, he is asymptomatic. Recommended he call clinic if he has SOB or swelling. He verbalized understanding.

## 2022-05-13 NOTE — Addendum Note (Signed)
Addended by: Betha Loa F on: 05/13/2022 03:19 PM   Modules accepted: Orders

## 2022-05-13 NOTE — Telephone Encounter (Signed)
Patient is out of Entresto. Spoke with Personnel officer at [employer]. Will fax order for entresto 97-103 to [phone].

## 2022-05-13 NOTE — Telephone Encounter (Signed)
Patient stated that he is now out of the sacubitril-valsartan (ENTRESTO) 97-103 MG  and would like options to get this medication until his application is finalized.

## 2022-05-21 ENCOUNTER — Telehealth: Payer: Self-pay | Admitting: Cardiovascular Disease

## 2022-05-21 NOTE — Telephone Encounter (Signed)
Returned call to patient-  He called [employer] x2-cannot get through.     Called [employer] to discuss-received new prescription but not re-enrollment forms  He has patient assistance through 06/13/22 Attempt to speak to pharmacist-unable to transfer call due to high volume.  On hold for 30 mins.   Representative states rx is being processed and should be completed in 3 days (received on 12/6).    Spoke to patient-he states he came to office in November to complete forms for next year.    Paperwork unable to be located.    Apologized to patient.   He will come to the office Monday or Tuesday to sign paperwork again.

## 2022-05-21 NOTE — Telephone Encounter (Signed)
Pt c/o medication issue:  1. Name of Medication: sacubitril-valsartan (ENTRESTO) 97-103 MG   2. How are you currently taking this medication (dosage and times per day)? Take 1 tablet by mouth 2 (two) times daily. - Oral   3. Are you having a reaction (difficulty breathing--STAT)?   4. What is your medication issue? Pt states he has still not received this medication. Please advise, pt has not taken this medication in over a week.

## 2022-05-25 NOTE — Telephone Encounter (Signed)
   Pt is calling back to f/u. He said he tried to get a 15 days refill for the entresto and [facility] pharmacy said it'll be $80 for 3 days supply and $160 for 6 days. He doesn't know what to do since he is out of medications

## 2022-05-27 NOTE — Telephone Encounter (Signed)
I am sorry, they do not provide samples of the 97-'103mg'$  dosage

## 2022-05-28 NOTE — Telephone Encounter (Signed)
If no other solution, then using the half dose samples or cutting 97/103 in half is best I can recommend. Worst case scenario, replace Entresto with Valsartan 320 mg daily.

## 2022-05-28 NOTE — Telephone Encounter (Signed)
Sent mychart message asking if patient has received his medication via patient assistance.

## 2022-05-31 NOTE — Telephone Encounter (Signed)
See mychart messages

## 2022-06-21 NOTE — Telephone Encounter (Signed)
Returned call to patients daughter and advised will check with Nurse regarding paper work and let her know.

## 2022-06-21 NOTE — Telephone Encounter (Signed)
Spoke to daughter to clarify.   Paperwork originally dropped off to office per patient in November.  RN never received to complete/fax.    Patient was made aware that we needed to complete paperwork again.   Patient did not come to office to complete.   Daughter messaged via mychart-aware paperwork needed to be completed to reapply for assistance.   Paperwork was mailed 12/22.     Per daughter-she is unsure if paperwork was received via mail or if patient has completed.   Requested paperwork be emailed to her at [email or web address]  Will send paperwork to email to be completed-daughter aware and verbalized understanding

## 2022-06-21 NOTE — Telephone Encounter (Signed)
Pt calling back because he is trying to figure out where his [employer] paperwork is. She states she filled it out and brought it back up and gave it to a guy at the front desk. Please advise.

## 2022-06-24 NOTE — Telephone Encounter (Signed)
Patient is following up, very upset that his [employer] paperwork with all of his personal and banking information on it has been misplaced. He states he was unaware that his daughter spoke with anyone regarding this matter because he never received a call back on Monday, but he has now been without the medication for several months and he is very concerned. He questions whether or not he should be on the medication since he has done well for the past 2-3 months without it. He would like a call back from the nurse to discuss as soon as possible.

## 2022-06-24 NOTE — Telephone Encounter (Signed)
Apologized for experience and only way we can start the process is we need the paperwork completed. We have mailed to him and sent via email to his daughter. Patient instructed to complete paperwork and bring into the office so we can start the process.

## 2022-07-06 ENCOUNTER — Ambulatory Visit: Payer: Medicare PPO | Attending: Cardiovascular Disease

## 2022-07-06 DIAGNOSIS — I428 Other cardiomyopathies: Secondary | ICD-10-CM | POA: Diagnosis not present

## 2022-07-06 LAB — CUP PACEART REMOTE DEVICE CHECK
Battery Remaining Longevity: 90 mo
Battery Remaining Percentage: 98 %
Brady Statistic RA Percent Paced: 24 %
Brady Statistic RV Percent Paced: 4 %
Date Time Interrogation Session: 20240123062800
HighPow Impedance: 80 Ohm
Implantable Lead Connection Status: 753985
Implantable Lead Connection Status: 753985
Implantable Lead Implant Date: 20161109
Implantable Lead Implant Date: 20161109
Implantable Lead Location: 753859
Implantable Lead Location: 753860
Implantable Lead Model: 293
Implantable Lead Model: 7741
Implantable Lead Serial Number: 385866
Implantable Lead Serial Number: 710559
Implantable Pulse Generator Implant Date: 20161109
Lead Channel Impedance Value: 491 Ohm
Lead Channel Impedance Value: 757 Ohm
Lead Channel Pacing Threshold Amplitude: 0.1 V
Lead Channel Pacing Threshold Amplitude: 1.2 V
Lead Channel Pacing Threshold Pulse Width: 0.4 ms
Lead Channel Pacing Threshold Pulse Width: 0.4 ms
Lead Channel Setting Pacing Amplitude: 2.4 V
Lead Channel Setting Pacing Amplitude: 2.4 V
Lead Channel Setting Pacing Pulse Width: 0.4 ms
Lead Channel Setting Sensing Sensitivity: 0.6 mV
Pulse Gen Serial Number: 512157
Zone Setting Status: 755011

## 2022-07-07 ENCOUNTER — Telehealth: Payer: Self-pay | Admitting: Cardiovascular Disease

## 2022-07-07 NOTE — Telephone Encounter (Signed)
Left message to call back  See mychart message

## 2022-07-07 NOTE — Telephone Encounter (Signed)
Patient's daughter returned call.  I advised her a MyChart message was sent, she said she just saw it. She said she will respond back via MyChart.

## 2022-07-07 NOTE — Telephone Encounter (Signed)
Follow Up:    Patient's daughter called and said she have not received the paperwork. The paperwork was for the Patient Assistance program with [employer].

## 2022-07-16 ENCOUNTER — Other Ambulatory Visit: Payer: Self-pay

## 2022-07-16 ENCOUNTER — Telehealth: Payer: Self-pay

## 2022-07-16 ENCOUNTER — Telehealth: Payer: Self-pay | Admitting: Cardiovascular Disease

## 2022-07-16 MED ORDER — VALSARTAN 160 MG PO TABS: 320.0000 mg | ORAL_TABLET | Freq: Every day | ORAL | 3 refills | Status: DC

## 2022-07-16 MED ORDER — VALSARTAN 320 MG PO TABS: 320.0000 mg | ORAL_TABLET | Freq: Every day | ORAL | 3 refills | Status: DC

## 2022-07-16 NOTE — Telephone Encounter (Addendum)
Patient advised that entresto is discontinued and that he will start taking valsartan '320mg'$  daily by mouth. He stated he was on the med years ago. Order placed and sent to his pharm. At 4:40 PM made appointment with Dr. Sallyanne Kuster for 3/11.

## 2022-07-16 NOTE — Telephone Encounter (Signed)
Per Dr. Sharin Grave to valsartan 320 mg and schedule follow up visit.

## 2022-07-16 NOTE — Telephone Encounter (Signed)
Spoke with patient who stated he has been off entresto for 2 months. The application was mailed to [employer] on 12/22. Spoke with [employer] who said a re-enrollment application must be completed. In the meantime, the provider portion would suffice for pt getting drug.

## 2022-07-16 NOTE — Telephone Encounter (Signed)
Pt c/o medication issue:  1. Name of Medication:  sacubitril-valsartan (ENTRESTO) 97-103 MG   2. How are you currently taking this medication (dosage and times per day)?   3. Are you having a reaction (difficulty breathing--STAT)?   4. What is your medication issue?   Patient is requesting updates on an application for [employer] patient assistance completed in November, 2023. Patient would like to know if he will need to complete a new form. Please advise.

## 2022-08-03 NOTE — Progress Notes (Signed)
Remote ICD transmission.   

## 2022-08-23 ENCOUNTER — Ambulatory Visit: Payer: Medicare PPO | Attending: Cardiovascular Disease | Admitting: Cardiovascular Disease

## 2022-08-23 ENCOUNTER — Encounter: Payer: Self-pay | Admitting: Cardiovascular Disease

## 2022-08-23 VITALS — BP 122/82 | HR 61 | Ht 72.0 in | Wt 311.6 lb

## 2022-08-23 DIAGNOSIS — I5042 Chronic combined systolic (congestive) and diastolic (congestive) heart failure: Secondary | ICD-10-CM | POA: Diagnosis not present

## 2022-08-23 DIAGNOSIS — I480 Paroxysmal atrial fibrillation: Secondary | ICD-10-CM | POA: Diagnosis not present

## 2022-08-23 DIAGNOSIS — I4729 Other ventricular tachycardia: Secondary | ICD-10-CM

## 2022-08-23 DIAGNOSIS — Z95810 Presence of automatic (implantable) cardiac defibrillator: Secondary | ICD-10-CM | POA: Diagnosis not present

## 2022-08-23 DIAGNOSIS — I10 Essential (primary) hypertension: Secondary | ICD-10-CM

## 2022-08-23 DIAGNOSIS — D6869 Other thrombophilia: Secondary | ICD-10-CM

## 2022-08-23 NOTE — Patient Instructions (Signed)
Medication Instructions:  No changes (Once approved for Entresto, you will discontinue Valsartan) *If you need a refill on your cardiac medications before your next appointment, please call your pharmacy*  Follow-Up: At [facility], you and your health needs are our priority.  As part of our continuing mission to provide you with exceptional heart care, we have created designated Provider Care Teams.  These Care Teams include your primary Cardiologist (physician) and Advanced Practice Providers (APPs -  Physician Assistants and Nurse Practitioners) who all work together to provide you with the care you need, when you need it.  We recommend signing up for the patient portal called "MyChart".  Sign up information is provided on this After Visit Summary.  MyChart is used to connect with patients for Virtual Visits (Telemedicine).  Patients are able to view lab/test results, encounter notes, upcoming appointments, etc.  Non-urgent messages can be sent to your provider as well.   To learn more about what you can do with MyChart, go to [email or web address].    Your next appointment:   1 year(s)  Provider:   Sanda Klein, MD

## 2022-08-23 NOTE — Progress Notes (Unsigned)
Patient ID: Alexander Higgins, male   DOB: Apr 19, 1946, 77 y.o.   MRN: [id number]     Cardiology Office Note    Date:  08/25/2022   ID:  Alexander Higgins, DOB January 20, 1946, MRN [id number]  PCP:  Jene Every, MD  Cardiologist:   Quay Burow, M.D.; Sanda Klein, MD   Chief complaint:  Follow-up CHF. ICD, atrial fibrillation   History of Present Illness:  Alexander Higgins is a 77 y.o. male , morbidly obese, roughly 5 years status post implantation of a [employer] dual-chamber defibrillator for severe nonischemic cardiomyopathy with a left ventricular ejection fraction of approximately 30%  and symptomatic sinus bradycardia.  He has done quite well.  He keeps going to the gym most days of the week and exercises without shortness of breath or chest pain.  He has not experienced palpitations or syncope.  He denies edema, orthopnea, PND, focal neurological complaints or intermittent claudication.  He has not had any defibrillator discharges.  He denies falls, injuries or serious bleeding.  Unfortunately he has been unable to get his Delene Loll due to issues with the patient assistance program.  He is currently taking valsartan alone as a substitute.  ICD interrogation showed normal device function.  He has only 25% atrial pacing and only 3% ventricular pacing.  Lead parameters are excellent and estimated generator longevity is 7 years.  He had a couple of episodes of paroxysmal atrial tachycardia lasting for only 2 seconds.  There was also a couple of events of false atrial mode switch due to "noise" on the atrial channel on 08/12/2022.  These look like external electromagnetic interference and indeed on that day he remembers going to the "shop" and standing fairly closely to his friend who was arc welding.  He has not had any evidence of electromagnetic interference on the ventricular channel.  He is also had 7 episodes of nonsustained ventricular tachycardia, including a lengthy 1 that was 25 beats  in duration.  (Clearly had A-V dissociation).  The episodes of NSVT have been consistently asymptomatic.  The frequency of episodes of nonsustained VT has not changed much since device implantation.  Presenting rhythm today is atrial paced, ventricular sensed at 60 bpm.    Past Medical History:  Diagnosis Date   AICD (automatic cardioverter/defibrillator) present    Arthritis    "right knee" (04/23/2015)   Atrial fibrillation ([place])    a. identified on device check in 07/2016.   Chronic combined systolic and diastolic CHF (congestive heart failure) ([place]) dx'd 11/2014   a. 05/2016: echo showing EF of 30-35%, Grade 1 DD, trivial MR, mild TR, PA peak pressure 17 mm Hg.    Hyperlipidemia    Hypertension    Nonischemic cardiomyopathy ([place])    a. s/p ICD placement in 04/2015    Past Surgical History:  Procedure Laterality Date   BIOPSY  01/22/2020   Procedure: BIOPSY;  Surgeon: Doran Stabler, MD;  Location: [facility];  Service: Gastroenterology;;   COLONOSCOPY WITH PROPOFOL N/A 01/22/2020   Procedure: COLONOSCOPY WITH PROPOFOL;  Surgeon: Doran Stabler, MD;  Location: [facility];  Service: Gastroenterology;  Laterality: N/A;   EP IMPLANTABLE DEVICE N/A 04/23/2015   Procedure: ICD Implant;  Surgeon: Sanda Klein, MD;  Location: [facility];  Service: Cardiovascular;  Laterality: N/A;   INSERTION OF ICD  04/23/2015   POLYPECTOMY  01/22/2020   Procedure: POLYPECTOMY;  Surgeon: Doran Stabler, MD;  Location: [facility];  Service: Gastroenterology;;  TONSILLECTOMY      Current Outpatient Medications  Medication Sig Dispense Refill   apixaban (ELIQUIS) 5 MG TABS tablet Take 1 tablet by mouth twice daily 180 tablet 1   furosemide (LASIX) 40 MG tablet Take 1 tablet by mouth twice daily 180 tablet 3   rosuvastatin (CRESTOR) 10 MG tablet TAKE 1 TABLET BY MOUTH AT BEDTIME 90 tablet 3   valsartan (DIOVAN) 320 MG tablet Take 1 tablet (320 mg total) by mouth daily. 90 tablet  3   carvedilol (COREG) 12.5 MG tablet TAKE 1 TABLET BY MOUTH TWICE DAILY WITH MEALS . APPOINTMENT REQUIRED FOR FUTURE REFILLS (Patient not taking: Reported on 08/23/2022) 60 tablet 11   clotrimazole (LOTRIMIN) 1 % cream Apply topically 2 (two) times daily. (Patient not taking: Reported on 08/23/2022)     diclofenac Sodium (VOLTAREN) 1 % GEL Apply 2 g topically 4 (four) times daily. (Patient not taking: Reported on 08/23/2022)     sildenafil (REVATIO) 20 MG tablet Take 1 tablet (20 mg total) by mouth 3 (three) times daily. (Patient not taking: Reported on 08/23/2022) 90 tablet 0   No current facility-administered medications for this visit.    Allergies:   Jardiance [empagliflozin]   Social History   Socioeconomic History   Marital status: Divorced    Spouse name: Not on file   Number of children: 5   Years of education: Not on file   Highest education level: Not on file  Occupational History   Not on file  Tobacco Use   Smoking status: Never   Smokeless tobacco: Never  Vaping Use   Vaping Use: Never used  Substance and Sexual Activity   Alcohol use: No    Alcohol/week: 0.0 standard drinks of alcohol   Drug use: No   Sexual activity: Yes  Other Topics Concern   Not on file  Social History Narrative   Exercise in gym every day   Social Determinants of Health   Financial Resource Strain: Not on file  Food Insecurity: Not on file  Transportation Needs: Not on file  Physical Activity: Not on file  Stress: Not on file  Social Connections: Not on file     Family History:  The patient's family history includes Heart attack in his brother; Heart disease in his mother; Hypertension in his brother and mother; Stroke in his sister.   ROS:   Please see the history of present illness.    ROS All other systems are reviewed and are negative.  PHYSICAL EXAM:   VS:  BP 122/82 (BP Location: Left Arm, Patient Position: Sitting, Cuff Size: Large)   Pulse 61   Ht 6' (1.829 m)   Wt (!)  311 lb 9.6 oz (141.3 kg)   SpO2 96%   BMI 42.26 kg/m       General: Alert, oriented x3, no distress, he is morbidly obese, but also looks pretty muscular and strong, especially for his age.  The ICD site looks healthy. Head: no evidence of trauma, PERRL, EOMI, no exophtalmos or lid lag, no myxedema, no xanthelasma; normal ears, nose and oropharynx Neck: normal jugular venous pulsations and no hepatojugular reflux; brisk carotid pulses without delay and no carotid bruits Chest: clear to auscultation, no signs of consolidation by percussion or palpation, normal fremitus, symmetrical and full respiratory excursions Cardiovascular: normal position and quality of the apical impulse, regular rhythm, normal first and second heart sounds, no murmurs, rubs or gallops Abdomen: no tenderness or distention, no masses by palpation, no  abnormal pulsatility or arterial bruits, normal bowel sounds, no hepatosplenomegaly Extremities: no clubbing, cyanosis or edema; 2+ radial, ulnar and brachial pulses bilaterally; 2+ right femoral, posterior tibial and dorsalis pedis pulses; 2+ left femoral, posterior tibial and dorsalis pedis pulses; no subclavian or femoral bruits Neurological: grossly nonfocal Psych: Normal mood and affect     Wt Readings from Last 3 Encounters:  08/23/22 (!) 311 lb 9.6 oz (141.3 kg)  02/08/22 (!) 306 lb (138.8 kg)  08/10/21 (!) 311 lb 12.8 oz (141.4 kg)      Studies/Labs Reviewed:   EKG:  EKG is ordered today.  Shows atrial paced, ventricular sensed rhythm otherwise normal tracing, QTc 416 ms  Recent Labs: No results found for requested labs within last 365 days.   Lipid Panel    Component Value Date/Time   CHOL 204 (H) 02/15/2019 1443   TRIG 154.0 (H) 02/15/2019 1443   HDL 36.80 (L) 02/15/2019 1443   CHOLHDL 6 02/15/2019 1443   VLDL 30.8 02/15/2019 1443   LDLCALC 136 (H) 02/15/2019 1443      ASSESSMENT:    1. Chronic combined systolic and diastolic CHF  (congestive heart failure) ([place])   2. ICD (implantable cardioverter-defibrillator) in place   3. Paroxysmal atrial fibrillation ([facility])   4. NSVT (nonsustained ventricular tachycardia) ([place])   5. Acquired thrombophilia ([place])   6. Essential hypertension      PLAN:  In order of problems listed above:  CHF: Maintains NYHA functional class I and euvolemia without loop diuretics.  Did not tolerate SGLT2 inhibitors due to increased irritation in the groin and genital area.  Nonischemic cardiomyopathy with most recent LVEF 30-35% but has not been reevaluated since 2017.  He is doing so well, that I am not sure that adding spironolactone makes sense.  We are reapplying for his Entresto patient assistance.  In the meantime he is taking valsartan 320 mg daily.  Once we obtain approval for the [facility] he will stop the valsartan and resume Entresto at the previous dose ICD: ICD function.  Remote downloads every 3 months. Afib: Extremely low burden of arrhythmia, none recorded recently.  2 episodes of mode switch were due to external interference from arc welding.  Told him to keep a respectful 6-12 feet away from anybody who is arc welding.  Thankfully there is no interference on the ventricular channel which could have led to defibrillator discharge.   CHADSVasc 3 (age, HF, HTN), on anticoagulant. NSVT: As before he continues to have episodes of NSVT once or twice a month, with the longest one being 25 beats in duration, these have consistently been asymptomatic and he has not received defibrillator discharges, to date.   Eliquis: Denies bleeding issues HTN: Very well-controlled  Medication Adjustments/Labs and Tests Ordered: Current medicines are reviewed at length with the patient today.  Concerns regarding medicines are outlined above.  Medication changes, Labs and Tests ordered today are listed below. Patient Instructions  Medication Instructions:  No changes (Once approved for Entresto, you will  discontinue Valsartan) *If you need a refill on your cardiac medications before your next appointment, please call your pharmacy*  Follow-Up: At [facility], you and your health needs are our priority.  As part of our continuing mission to provide you with exceptional heart care, we have created designated Provider Care Teams.  These Care Teams include your primary Cardiologist (physician) and Advanced Practice Providers (APPs -  Physician Assistants and Nurse Practitioners) who all work together to provide you with  the care you need, when you need it.  We recommend signing up for the patient portal called "MyChart".  Sign up information is provided on this After Visit Summary.  MyChart is used to connect with patients for Virtual Visits (Telemedicine).  Patients are able to view lab/test results, encounter notes, upcoming appointments, etc.  Non-urgent messages can be sent to your provider as well.   To learn more about what you can do with MyChart, go to [email or web address].    Your next appointment:   1 year(s)  Provider:   Sanda Klein, MD        Signed, Sanda Klein, MD  08/25/2022 9:36 PM    [facility] [address], [place], [place]  [address] Phone: [phone]; Fax: [phone]

## 2022-08-25 ENCOUNTER — Encounter: Payer: Self-pay | Admitting: Cardiovascular Disease

## 2022-09-03 ENCOUNTER — Encounter: Payer: Self-pay | Admitting: Cardiovascular Disease

## 2022-09-03 NOTE — Telephone Encounter (Signed)
Pt daughter came into office and sent FMLA paperwork via [place]. Daughter was unable to print at home. Forms received and put in Dr.Croitoru box in red folder.

## 2022-09-13 ENCOUNTER — Telehealth: Payer: Self-pay | Admitting: Emergency Medicine

## 2022-09-13 NOTE — Telephone Encounter (Signed)
Called   pt's daughter- filling out FMLA paperwork for her. Also spoke with her about the items needed to complete patient assistance application for Entresto.  She states that she will send missing information through MyChart (Case# for FMLA) Missing Medicare- Red, white, and Blue card copy and information Also missing- Proof of income

## 2022-09-28 ENCOUNTER — Telehealth: Payer: Self-pay | Admitting: Emergency Medicine

## 2022-09-28 NOTE — Telephone Encounter (Signed)
Faxed pt's daughter's intermittent LOA paperwork. It was faxed without the Case # due to [facility] not getting back in touch with the information- tried several times to get in touch regarding Case # and also missing items from Patient Assistance Application.

## 2022-10-01 ENCOUNTER — Other Ambulatory Visit: Payer: Self-pay | Admitting: Cardiovascular Disease

## 2022-10-01 DIAGNOSIS — I480 Paroxysmal atrial fibrillation: Secondary | ICD-10-CM

## 2022-10-01 NOTE — Telephone Encounter (Signed)
Pt last saw Dr Royann Shivers 08/23/22, last labs 05/03/22 Creat 1.10, age 77, weight 141.3kg, based on specified criteria pt is on appropriate dosage of Eliquis  BID for afib.  Will refill rx.

## 2022-10-05 ENCOUNTER — Other Ambulatory Visit: Payer: Self-pay | Admitting: Cardiovascular Disease

## 2022-10-05 ENCOUNTER — Ambulatory Visit (INDEPENDENT_AMBULATORY_CARE_PROVIDER_SITE_OTHER): Payer: Medicare PPO

## 2022-10-05 DIAGNOSIS — I5042 Chronic combined systolic (congestive) and diastolic (congestive) heart failure: Secondary | ICD-10-CM | POA: Diagnosis not present

## 2022-10-06 LAB — CUP PACEART REMOTE DEVICE CHECK
Battery Remaining Longevity: 78 mo
Battery Remaining Percentage: 90 %
Brady Statistic RA Percent Paced: 27 %
Brady Statistic RV Percent Paced: 14 %
Date Time Interrogation Session: 20240423031200
HighPow Impedance: 76 Ohm
Implantable Lead Connection Status: 753985
Implantable Lead Connection Status: 753985
Implantable Lead Implant Date: 20161109
Implantable Lead Implant Date: 20161109
Implantable Lead Location: 753859
Implantable Lead Location: 753860
Implantable Lead Model: 293
Implantable Lead Model: 7741
Implantable Lead Serial Number: 385866
Implantable Lead Serial Number: 710559
Implantable Pulse Generator Implant Date: 20161109
Lead Channel Impedance Value: 448 Ohm
Lead Channel Impedance Value: 731 Ohm
Lead Channel Pacing Threshold Amplitude: 0.1 V
Lead Channel Pacing Threshold Amplitude: 0.8 V
Lead Channel Pacing Threshold Pulse Width: 0.4 ms
Lead Channel Pacing Threshold Pulse Width: 0.6 ms
Lead Channel Setting Pacing Amplitude: 1.6 V
Lead Channel Setting Pacing Amplitude: 2.4 V
Lead Channel Setting Pacing Pulse Width: 0.4 ms
Lead Channel Setting Sensing Sensitivity: 0.6 mV
Pulse Gen Serial Number: 512157
Zone Setting Status: 755011

## 2022-10-21 ENCOUNTER — Telehealth: Payer: Self-pay | Admitting: Cardiovascular Disease

## 2022-10-21 NOTE — Telephone Encounter (Signed)
Pt dropped off additional paperwork (tax forms & copy of insurance card); stated nurse needed it for Pt. Assistance paperwork.  10-21-22, J.Britt

## 2022-10-22 ENCOUNTER — Telehealth: Payer: Self-pay | Admitting: Cardiovascular Disease

## 2022-10-22 ENCOUNTER — Telehealth: Payer: Self-pay | Admitting: Emergency Medicine

## 2022-10-22 NOTE — Telephone Encounter (Signed)
Pt c/o medication issue:  1. Name of Medication: apixaban (ELIQUIS) 5 MG TABS tablet   2. How are you currently taking this medication (dosage and times per day)? Take 1 tablet by mouth twice daily   3. Are you having a reaction (difficulty breathing--STAT)? No   4. What is your medication issue? Cheron from [employer] said that Dr. Royann Shivers needs to date application and send in an updated copy of application

## 2022-10-22 NOTE — Telephone Encounter (Signed)
Spoke with rep and they said that at the bottom of the application it had the year 2022, they need the pt to fill out the most updated application= 2023; and also need a date beside provider's signature.   Was given the website to print off most updated version of application. Will have to update and send in next week.

## 2022-10-22 NOTE — Telephone Encounter (Signed)
[employer] Art therapist faxed for [employer]

## 2022-10-25 NOTE — Telephone Encounter (Signed)
Faxed updated application information to [employer]

## 2022-11-02 NOTE — Progress Notes (Signed)
Remote ICD transmission.   

## 2022-11-08 ENCOUNTER — Other Ambulatory Visit: Payer: Self-pay | Admitting: Cardiovascular Disease

## 2022-11-10 ENCOUNTER — Encounter: Payer: Self-pay | Admitting: Gastroenterology

## 2022-11-12 ENCOUNTER — Encounter: Payer: Self-pay | Admitting: Cardiovascular Disease

## 2023-01-04 ENCOUNTER — Ambulatory Visit (INDEPENDENT_AMBULATORY_CARE_PROVIDER_SITE_OTHER): Payer: Medicare PPO

## 2023-01-04 DIAGNOSIS — I5042 Chronic combined systolic (congestive) and diastolic (congestive) heart failure: Secondary | ICD-10-CM

## 2023-01-05 LAB — CUP PACEART REMOTE DEVICE CHECK
Battery Remaining Longevity: 78 mo
Battery Remaining Percentage: 90 %
Brady Statistic RA Percent Paced: 30 %
Brady Statistic RV Percent Paced: 14 %
Date Time Interrogation Session: 20240723021800
HighPow Impedance: 79 Ohm
Implantable Lead Connection Status: 753985
Implantable Lead Connection Status: 753985
Implantable Lead Implant Date: 20161109
Implantable Lead Implant Date: 20161109
Implantable Lead Location: 753859
Implantable Lead Location: 753860
Implantable Lead Model: 293
Implantable Lead Model: 7741
Implantable Lead Serial Number: 385866
Implantable Lead Serial Number: 710559
Implantable Pulse Generator Implant Date: 20161109
Lead Channel Impedance Value: 475 Ohm
Lead Channel Impedance Value: 755 Ohm
Lead Channel Pacing Threshold Amplitude: 0.1 V
Lead Channel Pacing Threshold Amplitude: 0.8 V
Lead Channel Pacing Threshold Pulse Width: 0.4 ms
Lead Channel Pacing Threshold Pulse Width: 0.6 ms
Lead Channel Setting Pacing Amplitude: 1.6 V
Lead Channel Setting Pacing Amplitude: 2.4 V
Lead Channel Setting Pacing Pulse Width: 0.4 ms
Lead Channel Setting Sensing Sensitivity: 0.6 mV
Pulse Gen Serial Number: 512157
Zone Setting Status: 755011

## 2023-01-06 ENCOUNTER — Telehealth: Payer: Self-pay

## 2023-01-06 ENCOUNTER — Ambulatory Visit: Payer: Medicare PPO | Attending: Cardiovascular Disease

## 2023-01-06 DIAGNOSIS — I428 Other cardiomyopathies: Secondary | ICD-10-CM

## 2023-01-06 NOTE — Telephone Encounter (Signed)
I have an available opening next on August 15.  If you guys can get him in sooner, it would be great, otherwise I will make that appointment for him.

## 2023-01-06 NOTE — Telephone Encounter (Signed)
Following alert received from [employer] received for Scheduled remote reviewed. Normal device function.  Presenting rhythm shows several beats with atrial loss of capture. Atrial auto threshold is not enabled. 1 AHR lasting 3 seconds, consistent with atrial flutter, some loss of atrial capture is also noted.    DC apt made today 01/06/23 @ 2:00 PM. Location, date and time confirmed with patents daughter.   "Per Joey with [employer] ~ Atrial output is currently set at 1.6V. Need to increase and possibly decrease the LRL to 55bpm."  Angelica Chessman and Baird Lyons made aware.

## 2023-01-06 NOTE — Telephone Encounter (Signed)
He is scheduled today in the device clinic at 2:00 PM.   Once she has charted the device clinic apt note, it should be automatically routed to you with the report attached. If you do not see it, please me know.   Thank you!

## 2023-01-07 NOTE — Telephone Encounter (Signed)
Forwarding information on device clinic visit (7/25) with patient to address issue with atrial lead:  Patient brought in to check device today after receiving alert for atrial loss of capture.  Complete device interrogation performed today using heart connect with Joey, [employer] providing direction.  Sensing, impedance trends and RV thresholds are all stable and within normal limits.  No episodes.   RA presenting programmed output was fixed at 1.6@0 .6ms,  RA threshold test today shows loss of output at 1.5 @0 .6ms.  Safety margin not adequate.  Also, Joey noted rate of pacing percentage on histograms.  He is aware that patient is very active with regular routine exercise.  Patient is not symptomatic with loss of capture or current programming.   Based on findings today, Joey with industry ([employer]) made the following program changes:  1.  Atrial Flutter Response turned on 2.  Atrial Flutter Response Trigger rate 160bpm 3.  Atrial Trigger to 4.  Atrial Trigger 170bpm to 160 bpm 5.  ATR Trigger rate 170bpm to 160bpm 6.  ATR Ventricular Rate Regulation turned ON  7.  RA programmed amplitude from 1.6 to fixed at 2.5 @ 0.60ms.  8.  PAV from to 9.  SAV from to 10.  Max Track Rate from 130ppm to 110ppm 11.  Max Track Rate interval to 12  Minimum PAV to 13.  Minimum SAV to  14.  Post Therapy Lower Rate Limit from 60ppm to 70ppm  Notes have been placed in [employer] and report to be scanned to EPIC media.   Patient educated on why we brought him in today and the changes made.  Also, informed his daughter and reassured both that nothing is wrong with his heart or the device with the changes we made and that we have fine tuned its function to give him best optimal performance.  He is due for routine follow up with Dr. Royann Shivers in March 2025.  He is on recall list for that appt.

## 2023-01-07 NOTE — Progress Notes (Signed)
Patient brought in to check device today after receiving alert for atrial loss of capture.  Complete device interrogation performed today using heart connect with Joey, [employer] providing direction.  Sensing, impedance trends and RV thresholds are all stable and within normal limits.  No episodes.   RA presenting programmed output was 1.6@0 .6ms,  RA threshold test today shows loss of output at 1.5 @0 .6ms.  Safety margin not adequate.  Also, Joey noted rate of pacing percentage on histograms.  He is aware that patient is very active with regular routine exercise.  Patient is not symptomatic with loss of capture or current programming.   Based on findings today, Joey with industry ([employer]) made the following program changes:  1.  Atrial Flutter Response turned on 2.  Atrial Flutter Response Trigger rate 160bpm 3.  Atrial Trigger to 4.  Atrial Trigger 170bpm to 160 bpm 5.  ATR Trigger rate 170bpm to 160bpm 6.  ATR Ventricular Rate Regulation turned ON  7.  RA programmed amplitude from 1.6 to fixed at 2.5 @ 0.50ms.  8.  PAV from to 9.  SAV from to 10.  Max Track Rate from 130ppm to 110ppm 11.  Max Track Rate interval to 12  Minimum PAV to 13.  Minimum SAV to  14.  Post Therapy Lower Rate Limit from 60ppm to 70ppm  Patient educated on why we brought him in today and the changes made.  Also, informed his daughter and reassured both that nothing is wrong with his heart or the device with the changes we made and that we have fine tuned its function to give him best optimal performance.  He is due for routine follow up with Dr. Royann Shivers in March 2025.  He is on recall list for that appt.

## 2023-01-07 NOTE — Telephone Encounter (Signed)
Thank you so much

## 2023-01-08 ENCOUNTER — Other Ambulatory Visit: Payer: Self-pay | Admitting: Cardiovascular Disease

## 2023-01-10 ENCOUNTER — Other Ambulatory Visit: Payer: Self-pay | Admitting: Cardiology

## 2023-01-10 ENCOUNTER — Telehealth: Payer: Self-pay | Admitting: Cardiovascular Disease

## 2023-01-10 MED ORDER — CARVEDILOL 12.5 MG PO TABS: 12.5000 mg | ORAL_TABLET | Freq: Two times a day (BID) | ORAL | 4 refills | Status: DC

## 2023-01-10 NOTE — Telephone Encounter (Signed)
*  STAT* If patient is at the pharmacy, call can be transferred to refill team.   1. Which medications need to be refilled? (please list name of each medication and dose if known)   carvedilol (COREG) 12.5 MG tablet    2. Which pharmacy/location (including street and city if local pharmacy) is medication to be sent to?  [facility] [address] [place], [place] - [address]    3. Do they need a 30 day or 90 day supply? 90

## 2023-01-11 NOTE — Telephone Encounter (Signed)
Looks line medication was sent in 01/10/23.

## 2023-01-19 NOTE — Progress Notes (Signed)
Remote ICD transmission.   

## 2023-03-08 ENCOUNTER — Telehealth: Payer: Self-pay

## 2023-03-08 ENCOUNTER — Encounter: Payer: Self-pay | Admitting: Gastroenterology

## 2023-03-08 ENCOUNTER — Ambulatory Visit: Payer: Medicare PPO | Admitting: Gastroenterology

## 2023-03-08 VITALS — BP 120/70 | HR 72 | Ht 72.0 in | Wt 307.0 lb

## 2023-03-08 DIAGNOSIS — I428 Other cardiomyopathies: Secondary | ICD-10-CM

## 2023-03-08 DIAGNOSIS — Z7902 Long term (current) use of antithrombotics/antiplatelets: Secondary | ICD-10-CM

## 2023-03-08 DIAGNOSIS — Z86010 Personal history of colonic polyps: Secondary | ICD-10-CM | POA: Diagnosis not present

## 2023-03-08 DIAGNOSIS — I480 Paroxysmal atrial fibrillation: Secondary | ICD-10-CM

## 2023-03-08 DIAGNOSIS — I5042 Chronic combined systolic (congestive) and diastolic (congestive) heart failure: Secondary | ICD-10-CM

## 2023-03-08 MED ORDER — NA SULFATE-K SULFATE-MG SULF 17.5-3.13-1.6 GM/177ML PO SOLN: 1.0000 | Freq: Once | ORAL | 0 refills | Status: AC

## 2023-03-08 NOTE — Progress Notes (Signed)
Gastroenterology Consult Note:  History: Alexander Higgins 03/08/2023  Referring provider: Barron Alvine, MD  Reason for consult/chief complaint: Colon Cancer Screening (Hx of polyps-last done 2021, on Eliquis thru [facility]. No bleeding per pt.)   Subjective  HPI: Alexander Higgins is here to reestablish care for medical evaluation and discussion of surveillance colonoscopy for history of colon polyps. I last saw him for a surveillance colonoscopy in August 2021, at which time he had 4 subcentimeter tubular adenomas.  That procedure was done in the hospital outpatient endoscopy lab due to his cardiovascular condition.   He had a subcentimeter tubular adenoma on a colonoscopy at [facility] in 2015. __________   Alexander Higgins reports that he is feeling well from a digestive standpoint.  He has some intermittent bloating and gas, but bowel habits are regular without rectal bleeding, appetite is normal without weight changes.  He denies heartburn dysphagia or odynophagia.  He works out 4 days a week and feels that his cardiovascular condition is good.  Some recent right knee pain.   ROS:  Review of Systems  Constitutional:  Negative for appetite change and unexpected weight change.  HENT:  Negative for mouth sores and voice change.   Eyes:  Negative for pain and redness.  Respiratory:  Negative for cough and shortness of breath.   Cardiovascular:  Negative for chest pain and palpitations.  Genitourinary:  Negative for dysuria and hematuria.  Musculoskeletal:  Positive for arthralgias. Negative for myalgias.  Skin:  Negative for pallor and rash.  Neurological:  Negative for weakness and headaches.  Hematological:  Negative for adenopathy.     Past Medical History: Past Medical History:  Diagnosis Date   AICD (automatic cardioverter/defibrillator) present    Arthritis    "right knee" (04/23/2015)   Atrial fibrillation (HCC)    a. identified on device check in 07/2016.   Chronic  combined systolic and diastolic CHF (congestive heart failure) (HCC) dx'd 11/2014   a. 05/2016: echo showing EF of 30-35%, Grade 1 DD, trivial MR, mild TR, PA peak pressure 17 mm Hg.    Hyperlipidemia    Hypertension    Nonischemic cardiomyopathy (HCC)    a. s/p ICD placement in 04/2015   Patient's cardiovascular function was stable from the standpoint of his systolic and diastolic heart failure, paroxysmal A-fib and asymptomatic NSVT at last cardiology office visit with Dr. Royann Shivers in March 2024, and I reviewed the entirety of that note.  Past Surgical History: Past Surgical History:  Procedure Laterality Date   BIOPSY  01/22/2020   Procedure: BIOPSY;  Surgeon: Sherrilyn Rist, MD;  Location: [facility];  Service: Gastroenterology;;   COLONOSCOPY WITH PROPOFOL N/A 01/22/2020   Procedure: COLONOSCOPY WITH PROPOFOL;  Surgeon: Sherrilyn Rist, MD;  Location: [facility];  Service: Gastroenterology;  Laterality: N/A;   EP IMPLANTABLE DEVICE N/A 04/23/2015   Procedure: ICD Implant;  Surgeon: Thurmon Fair, MD;  Location: [facility] INVASIVE CV LAB;  Service: Cardiovascular;  Laterality: N/A;   INSERTION OF ICD  04/23/2015   POLYPECTOMY  01/22/2020   Procedure: POLYPECTOMY;  Surgeon: Sherrilyn Rist, MD;  Location: [facility];  Service: Gastroenterology;;   TONSILLECTOMY       Family History: Family History  Problem Relation Age of Onset   Hypertension Mother    Heart disease Mother    Emphysema Father        smoker   Stroke Sister    Hypertension Brother    Heart attack  Brother    Colon cancer Neg Hx    Esophageal cancer Neg Hx     Social History: Social History   Socioeconomic History   Marital status: Divorced    Spouse name: Not on file   Number of children: 5   Years of education: Not on file   Highest education level: Not on file  Occupational History   Occupation: retired  Tobacco Use   Smoking status: Never   Smokeless tobacco: Never  Vaping Use   Vaping  status: Never Used  Substance and Sexual Activity   Alcohol use: No    Alcohol/week: 0.0 standard drinks of alcohol   Drug use: No   Sexual activity: Yes  Other Topics Concern   Not on file  Social History Narrative   Exercise in gym every day   Social Determinants of Health   Financial Resource Strain: Low Risk  (11/01/2022)   Received from [facility], [facility]   Overall Financial Resource Strain (CARDIA)    Difficulty of Paying Living Expenses: Not hard at all  Food Insecurity: No Food Insecurity (11/01/2022)   Received from [facility], [facility]   Hunger Vital Sign    Worried About Running Out of Food in the Last Year: Never true    Ran Out of Food in the Last Year: Never true  Transportation Needs: No Transportation Needs (11/01/2022)   Received from [facility], [facility]   [facility] - Transportation    Lack of Transportation (Medical): No    Lack of Transportation (Non-Medical): No  Physical Activity: Sufficiently Active (11/01/2022)   Received from [facility], [facility]   Exercise Vital Sign    Days of Exercise per Week: 4 days    Minutes of Exercise per Session: 150+ min  Stress: No Stress Concern Present (11/01/2022)   Received from [facility], [facility] - Occupational Stress Questionnaire    Feeling of Stress : Not at all  Social Connections: Moderately Integrated (11/01/2022)   Received from [facility], [facility]   Social Connection and Isolation Panel [NHANES]    Frequency of Communication with Friends and Family: More than three times a week    Frequency of Social Gatherings with Friends and Family: More than three times a week    Attends Religious Services: More than 4 times per year    Active Member of [employer] or Organizations: Yes    Attends Engineer, structural: More than 4 times per year    Marital Status: Divorced    Allergies: Allergies   Allergen Reactions   Jardiance [Empagliflozin] Itching    Outpatient Meds: Current Outpatient Medications  Medication Sig Dispense Refill   apixaban (ELIQUIS) 5 MG TABS tablet Take 1 tablet by mouth twice daily (Patient taking differently: Patient cut down to one daily) 180 tablet 1   carvedilol (COREG) 12.5 MG tablet Take 1 tablet (12.5 mg total) by mouth 2 (two) times daily with a meal. 180 tablet 4   furosemide (LASIX) 40 MG tablet Take 1 tablet by mouth twice daily 180 tablet 3   rosuvastatin (CRESTOR) 10 MG tablet TAKE 1 TABLET BY MOUTH AT BEDTIME 90 tablet 3   valsartan (DIOVAN) 320 MG tablet Take 1 tablet (320 mg total) by mouth daily. 90 tablet 3   No current facility-administered medications for this visit.    Of note, he reports today  that he has only been taking his Eliquis once daily for at least the last several months.  Was not a cost issue, but he started taking cayenne pepper as a partial substitute.  Naturally, I strongly recommended that he get back on his Eliquis twice daily.  ___________________________________________________________________ Objective   Exam:  BP 120/70 Comment: large cuff  Pulse 72   Ht 6' (1.829 m)   Wt (!) 307 lb (139.3 kg)   BMI 41.64 kg/m  Wt Readings from Last 3 Encounters:  03/08/23 (!) 307 lb (139.3 kg)  08/23/22 (!) 311 lb 9.6 oz (141.3 kg)  02/08/22 (!) 306 lb (138.8 kg)    General: Well-appearing Eyes: sclera anicteric, no redness ENT: oral mucosa moist without lesions, no cervical or supraclavicular lymphadenopathy CV: Regular without appreciable murmur, no JVD, no peripheral edema.  Pacer left upper chest wall Resp: clear to auscultation bilaterally, normal RR and effort noted GI: soft, obese, no tenderness, with active bowel sounds. No guarding or palpable organomegaly noted. Skin; warm and dry, no rash or jaundice noted Neuro: awake, alert and oriented x 3. Normal gross motor function and fluent speech  Labs:  No  recent labs  Last 2D echocardiogram in 2017-LVEF 30 to 35%  Assessment: Encounter Diagnoses  Name Primary?   Personal history of colonic polyps Yes   Non-ischemic cardiomyopathy (HCC)    Paroxysmal atrial fibrillation (HCC)    Chronic combined systolic and diastolic CHF (congestive heart failure) (HCC)    Long term (current) use of antithrombotics/antiplatelets     Deionte is due for a surveillance colonoscopy that must be done in the hospital outpatient endoscopy lab due to his CHF.  He has no current concerning digestive symptoms, and is cardiovascular condition is stable.  Procedural risk increased due to his cardiovascular condition and oral anticoagulation.  OAC must be held 36 hours prior to procedure, we will clear this with his cardiologist who I expect will be agreeable.  He understands the small but real risk of stroke during the brief preprocedural hold of oral anticoagulation.  As noted above, he has only been taking his Eliquis once daily and was advised to increase it to twice daily as prescribed to decrease the chance of stroke.  Plan:  Colonoscopy  The benefits and risks of the planned procedure were described in detail with the patient or (when appropriate) their health care proxy.  Risks were outlined as including, but not limited to, bleeding, infection, perforation, adverse medication reaction leading to cardiac or pulmonary decompensation, pancreatitis (if ERCP).  The limitation of incomplete mucosal visualization was also discussed.  No guarantees or warranties were given.   Thank you for the courtesy of this consult.  Please call me with any questions or concerns.  [name]  CC: Referring provider noted above

## 2023-03-08 NOTE — Telephone Encounter (Signed)
[facility] Pre-operative Risk Assessment     Request for surgical clearance:     Endoscopy Procedure  What type of surgery is being performed?     Colonoscopy  When is this surgery scheduled?     04-28-2023  What type of clearance is required ?   Pharmacy  Are there any medications that need to be held prior to surgery and how long? Yes, Eliquis 2 days  Practice name and name of physician performing surgery?      [facility]  What is your office phone and fax number?      Phone- [phone]  Fax- [phone]  Anesthesia type (None, local, MAC, general) ?       MAC

## 2023-03-08 NOTE — Patient Instructions (Signed)
_______________________________________________________  If your blood pressure at your visit was 140/90 or greater, please contact your primary care physician to follow up on this.  _______________________________________________________  If you are age 76 or older, your body mass index should be between 23-30. Your Body mass index is 41.64 kg/m. If this is out of the aforementioned range listed, please consider follow up with your Primary Care Provider.  If you are age 2 or younger, your body mass index should be between 19-25. Your Body mass index is 41.64 kg/m. If this is out of the aformentioned range listed, please consider follow up with your Primary Care Provider.   ________________________________________________________  The [facility] providers would like to encourage you to use [facility] to communicate with providers for non-urgent requests or questions.  Due to long hold times on the telephone, sending your provider a message by [facility] may be a faster and more efficient way to get a response.  Please allow 48 business hours for a response.  Please remember that this is for non-urgent requests.  _______________________________________________________  Alexander Higgins have been scheduled for a colonoscopy. Please follow written instructions given to you at your visit today.   Please pick up your prep supplies at the pharmacy within the next 1-3 days.  If you use inhalers (even only as needed), please bring them with you on the day of your procedure.  DO NOT TAKE 7 DAYS PRIOR TO TEST- Trulicity (dulaglutide) Ozempic, Wegovy (semaglutide) Mounjaro (tirzepatide) Bydureon Bcise (exanatide extended release)  DO NOT TAKE 1 DAY PRIOR TO YOUR TEST Rybelsus (semaglutide) Adlyxin (lixisenatide) Victoza (liraglutide) Byetta (exanatide) ___________________________________________________________________________  Alexander Higgins will be contacted by our office prior to your procedure for directions on  holding your ELIQUIS.  If you do not hear from our office 1 week prior to your scheduled procedure, please call [phone] to discuss.    Due to recent changes in healthcare laws, you may see the results of your imaging and laboratory studies on MyChart before your provider has had a chance to review them.  We understand that in some cases there may be results that are confusing or concerning to you. Not all laboratory results come back in the same time frame and the provider may be waiting for multiple results in order to interpret others.  Please give [place] 48 hours in order for your provider to thoroughly review all the results before contacting the office for clarification of your results.   It was a pleasure to see you today!  Thank you for trusting me with your gastrointestinal care!

## 2023-03-08 NOTE — Telephone Encounter (Signed)
[facility] Pre-operative Risk Assessment     Request for surgical clearance:     Endoscopy Procedure  What type of surgery is being performed?     Colonoscopy  When is this surgery scheduled?     04-28-2023  What type of clearance is required ?   Pharmacy  Are there any medications that need to be held prior to surgery and how long? Eliquis 2 day hold request  Practice name and name of physician performing surgery?      [facility]  What is your office phone and fax number?      Phone- [phone]  Fax- [phone]  Anesthesia type (None, local, MAC, general) ?       MAC

## 2023-03-09 ENCOUNTER — Telehealth: Payer: Self-pay

## 2023-03-09 NOTE — Telephone Encounter (Signed)
1st attempt to reach pt. Lvm

## 2023-03-09 NOTE — Telephone Encounter (Signed)
Pharm clearance only. Last OV 08/2022, doing well.

## 2023-03-09 NOTE — Telephone Encounter (Signed)
  Patient Consent for Virtual Visit         Alexander Higgins has provided verbal consent on 03/09/2023 for a virtual visit (video or telephone).   CONSENT FOR VIRTUAL VISIT FOR:  Alexander Higgins  By participating in this virtual visit I agree to the following:  I hereby voluntarily request, consent and authorize [facility] and its employed or contracted physicians, physician assistants, nurse practitioners or other licensed health care professionals (the Practitioner), to provide me with telemedicine health care services (the "Services") as deemed necessary by the treating Practitioner. I acknowledge and consent to receive the Services by the Practitioner via telemedicine. I understand that the telemedicine visit will involve communicating with the Practitioner through live audiovisual communication technology and the disclosure of certain medical information by electronic transmission. I acknowledge that I have been given the opportunity to request an in-person assessment or other available alternative prior to the telemedicine visit and am voluntarily participating in the telemedicine visit.  I understand that I have the right to withhold or withdraw my consent to the use of telemedicine in the course of my care at any time, without affecting my right to future care or treatment, and that the Practitioner or I may terminate the telemedicine visit at any time. I understand that I have the right to inspect all information obtained and/or recorded in the course of the telemedicine visit and may receive copies of available information for a reasonable fee.  I understand that some of the potential risks of receiving the Services via telemedicine include:  Delay or interruption in medical evaluation due to technological equipment failure or disruption; Information transmitted may not be sufficient (e.g. poor resolution of images) to allow for appropriate medical decision making by the  Practitioner; and/or  In rare instances, security protocols could fail, causing a breach of personal health information.  Furthermore, I acknowledge that it is my responsibility to provide information about my medical history, conditions and care that is complete and accurate to the best of my ability. I acknowledge that Practitioner's advice, recommendations, and/or decision may be based on factors not within their control, such as incomplete or inaccurate data provided by me or distortions of diagnostic images or specimens that may result from electronic transmissions. I understand that the practice of medicine is not an exact science and that Practitioner makes no warranties or guarantees regarding treatment outcomes. I acknowledge that a copy of this consent can be made available to me via my patient portal [facility] MyChart), or I can request a printed copy by calling the office of  [facility].    I understand that my insurance will be billed for this visit.   I have read or had this consent read to me. I understand the contents of this consent, which adequately explains the benefits and risks of the Services being provided via telemedicine.  I have been provided ample opportunity to ask questions regarding this consent and the Services and have had my questions answered to my satisfaction. I give my informed consent for the services to be provided through the use of telemedicine in my medical care

## 2023-03-09 NOTE — Telephone Encounter (Signed)
Patient scheduled for tele visit on 04/11/23. Med rec and consent done

## 2023-03-09 NOTE — Telephone Encounter (Signed)
Sheralyn Boatman,  Did you mean to send this to me or to the cardiology clinic?  - H. Danis

## 2023-03-09 NOTE — Telephone Encounter (Signed)
Patient with diagnosis of afib on Eliquis for anticoagulation.    Procedure: colonoscopy Date of procedure: 04/28/2023   CHA2DS2-VASc Score = 4   This indicates a 4.8% annual risk of stroke. The patient's score is based upon: CHF History: 1 HTN History: 1 Diabetes History: 0 Stroke History: 0 Vascular Disease History: 0 Age Score: 2 Gender Score: 0      CrCl 75 ml/min  Per office protocol, patient can hold Eliquis for 2 days prior to procedure.    **This guidance is not considered finalized until pre-operative APP has relayed final recommendations.**

## 2023-03-09 NOTE — Telephone Encounter (Signed)
Name: Alexander Higgins  DOB: Feb 28, 1946  MRN: [id number]  Primary Cardiologist: Thurmon Fair, MD   Preoperative team, please contact this patient and set up a phone call appointment for further preoperative risk assessment. Please obtain consent and complete medication review. Thank you for your help.  I confirm that guidance regarding antiplatelet and oral anticoagulation therapy has been completed and, if necessary, noted below.  Patient with diagnosis of afib on Eliquis for anticoagulation.     Procedure: colonoscopy Date of procedure: 04/28/2023     CHA2DS2-VASc Score = 4   This indicates a 4.8% annual risk of stroke. The patient's score is based upon: CHF History: 1 HTN History: 1 Diabetes History: 0 Stroke History: 0 Vascular Disease History: 0 Age Score: 2 Gender Score: 0       CrCl 75 ml/min   Per office protocol, patient can hold Eliquis for 2 days prior to procedure.   Ronney Asters, NP 03/09/2023, 1:45 PM  [facility]

## 2023-04-05 ENCOUNTER — Ambulatory Visit (INDEPENDENT_AMBULATORY_CARE_PROVIDER_SITE_OTHER): Payer: Medicare PPO

## 2023-04-05 DIAGNOSIS — I428 Other cardiomyopathies: Secondary | ICD-10-CM | POA: Diagnosis not present

## 2023-04-06 LAB — CUP PACEART REMOTE DEVICE CHECK
Battery Remaining Longevity: 78 mo
Battery Remaining Percentage: 87 %
Brady Statistic RA Percent Paced: 42 %
Brady Statistic RV Percent Paced: 0 %
Date Time Interrogation Session: 20241022015300
HighPow Impedance: 79 Ohm
Implantable Lead Connection Status: 753985
Implantable Lead Connection Status: 753985
Implantable Lead Implant Date: 20161109
Implantable Lead Implant Date: 20161109
Implantable Lead Location: 753859
Implantable Lead Location: 753860
Implantable Lead Model: 293
Implantable Lead Model: 7741
Implantable Lead Serial Number: 385866
Implantable Lead Serial Number: 710559
Implantable Pulse Generator Implant Date: 20161109
Lead Channel Impedance Value: 474 Ohm
Lead Channel Impedance Value: 749 Ohm
Lead Channel Pacing Threshold Amplitude: 0.1 V
Lead Channel Pacing Threshold Amplitude: 1.4 V
Lead Channel Pacing Threshold Pulse Width: 0.4 ms
Lead Channel Pacing Threshold Pulse Width: 0.6 ms
Lead Channel Setting Pacing Amplitude: 2.4 V
Lead Channel Setting Pacing Amplitude: 2.5 V
Lead Channel Setting Pacing Pulse Width: 0.4 ms
Lead Channel Setting Sensing Sensitivity: 0.6 mV
Pulse Gen Serial Number: 512157
Zone Setting Status: 755011

## 2023-04-11 ENCOUNTER — Ambulatory Visit: Payer: Medicare PPO | Attending: Cardiology

## 2023-04-11 DIAGNOSIS — Z01810 Encounter for preprocedural cardiovascular examination: Secondary | ICD-10-CM

## 2023-04-11 NOTE — Progress Notes (Signed)
Virtual Visit via Telephone Note   Because of Branden Eshelman's co-morbid illnesses, he is at least at moderate risk for complications without adequate follow up.  This format is felt to be most appropriate for this patient at this time.  The patient did not have access to video technology/had technical difficulties with video requiring transitioning to audio format only (telephone).  All issues noted in this document were discussed and addressed.  No physical exam could be performed with this format.  Please refer to the patient's chart for his consent to telehealth for [facility].  Evaluation Performed:  Preoperative cardiovascular risk assessment _____________   Date:  04/11/2023   Patient ID:  Alexander Higgins, DOB May 03, 1946, MRN [id number] Patient Location:  Home Provider location:   Office  Primary Care Provider:  Barron Alvine, MD Primary Cardiologist:  Thurmon Fair, MD  Chief Complaint / Patient Profile   77 y.o. y/o male with a h/o, paroxysmal AF, HTN, symptomatic bradycardia, NICM s/p ICD 2016 who is pending colonoscopy and presents today for telephonic preoperative cardiovascular risk assessment.  History of Present Illness    Alexander Higgins is a 77 y.o. male who presents via audio/video conferencing for a telehealth visit today.  Pt was last seen in cardiology clinic on 08/23/2022 by Dr. Royann Shivers.  At that time Alexander Higgins was doing well with no new cardiac complaints well-controlled BP. The patient is now pending procedure as outlined above. Since his last visit, he has been doing well and going to the gym 4 days a week and blood pressures have been elevated at times but last pressure completed on 03/04/2023 was 120/70.  He denies chest pain, shortness of breath, lower extremity edema, fatigue, palpitations, melena, hematuria, hemoptysis, diaphoresis, weakness, presyncope, syncope, orthopnea, and PND.   Past Medical History    Past Medical History:   Diagnosis Date   AICD (automatic cardioverter/defibrillator) present    Arthritis    "right knee" (04/23/2015)   Atrial fibrillation (HCC)    a. identified on device check in 07/2016.   Chronic combined systolic and diastolic CHF (congestive heart failure) (HCC) dx'd 11/2014   a. 05/2016: echo showing EF of 30-35%, Grade 1 DD, trivial MR, mild TR, PA peak pressure 17 mm Hg.    Hyperlipidemia    Hypertension    Nonischemic cardiomyopathy (HCC)    a. s/p ICD placement in 04/2015   Past Surgical History:  Procedure Laterality Date   BIOPSY  01/22/2020   Procedure: BIOPSY;  Surgeon: Sherrilyn Rist, MD;  Location: [facility];  Service: Gastroenterology;;   COLONOSCOPY WITH PROPOFOL N/A 01/22/2020   Procedure: COLONOSCOPY WITH PROPOFOL;  Surgeon: Sherrilyn Rist, MD;  Location: [facility];  Service: Gastroenterology;  Laterality: N/A;   EP IMPLANTABLE DEVICE N/A 04/23/2015   Procedure: ICD Implant;  Surgeon: Thurmon Fair, MD;  Location: [facility];  Service: Cardiovascular;  Laterality: N/A;   INSERTION OF ICD  04/23/2015   POLYPECTOMY  01/22/2020   Procedure: POLYPECTOMY;  Surgeon: Sherrilyn Rist, MD;  Location: [facility];  Service: Gastroenterology;;   TONSILLECTOMY      Allergies  Allergies  Allergen Reactions   Jardiance [Empagliflozin] Itching    Home Medications    Prior to Admission medications   Medication Sig Start Date End Date Taking? Authorizing Provider  apixaban (ELIQUIS) 5 MG TABS tablet Take 1 tablet by mouth twice daily Patient taking differently: Patient cut down to one daily 10/01/22   Croitoru,  Mihai, MD  carvedilol (COREG) 12.5 MG tablet Take 1 tablet (12.5 mg total) by mouth 2 (two) times daily with a meal. 01/10/23   Leone Brand, NP  furosemide (LASIX) 40 MG tablet Take 1 tablet by mouth twice daily 11/09/22   Croitoru, Mihai, MD  rosuvastatin (CRESTOR) 10 MG tablet TAKE 1 TABLET BY MOUTH AT BEDTIME 04/07/22   Croitoru, Mihai, MD   valsartan (DIOVAN) 320 MG tablet Take 1 tablet (320 mg total) by mouth daily. 07/16/22   Croitoru, Rachelle Hora, MD    Physical Exam    Vital Signs:  Alexander Higgins does not have vital signs available for review today.120/70  Given telephonic nature of communication, physical exam is limited. AAOx3. NAD. Normal affect.  Speech and respirations are unlabored.  Accessory Clinical Findings    None  Assessment & Plan    1.  Preoperative Cardiovascular Risk Assessment: -Patient's RCRI score is 6.6%  The patient affirms he has been doing well without any new cardiac symptoms. They are able to achieve 7 METS without cardiac limitations. Therefore, based on ACC/AHA guidelines, the patient would be at acceptable risk for the planned procedure without further cardiovascular testing. The patient was advised that if he develops new symptoms prior to surgery to contact our office to arrange for a follow-up visit, and he verbalized understanding.   The patient was advised that if he develops new symptoms prior to surgery to contact our office to arrange for a follow-up visit, and he verbalized understanding.  Per office protocol, patient can hold Eliquis for 2 days prior to procedure.    A copy of this note will be routed to requesting surgeon.  Time:   Today, I have spent 6 minutes with the patient with telehealth technology discussing medical history, symptoms, and management plan.     Napoleon Form, Leodis Rains, NP  04/11/2023, 7:30 AM

## 2023-04-13 ENCOUNTER — Telehealth: Payer: Self-pay

## 2023-04-13 NOTE — Telephone Encounter (Signed)
Called patient and notified him of his eliquis 2 day hold. Patient verbalized understanding.

## 2023-04-21 ENCOUNTER — Telehealth: Payer: Self-pay

## 2023-04-21 NOTE — Telephone Encounter (Signed)
Confirmed with patient that he will be attending procedure next week 04/28/23 with Dr. Myrtie Neither.

## 2023-04-22 ENCOUNTER — Other Ambulatory Visit: Payer: Self-pay | Admitting: Cardiovascular Disease

## 2023-04-22 NOTE — Progress Notes (Signed)
Remote ICD transmission.   

## 2023-04-28 ENCOUNTER — Encounter (HOSPITAL_COMMUNITY): Payer: Self-pay | Admitting: Gastroenterology

## 2023-04-28 ENCOUNTER — Ambulatory Visit (HOSPITAL_COMMUNITY): Payer: Medicare PPO | Admitting: Anesthesiology

## 2023-04-28 ENCOUNTER — Ambulatory Visit (HOSPITAL_COMMUNITY)
Admission: RE | Admit: 2023-04-28 | Discharge: 2023-04-28 | Disposition: A | Discharge status: Home / Self Care | Payer: Medicare PPO | Attending: Gastroenterology | Admitting: Gastroenterology

## 2023-04-28 ENCOUNTER — Encounter (HOSPITAL_COMMUNITY)
Admission: RE | Disposition: A | Discharge status: Home / Self Care | Payer: Self-pay | Source: Home / Self Care | Attending: Gastroenterology

## 2023-04-28 ENCOUNTER — Other Ambulatory Visit: Payer: Self-pay

## 2023-04-28 DIAGNOSIS — I428 Other cardiomyopathies: Secondary | ICD-10-CM

## 2023-04-28 DIAGNOSIS — Q438 Other specified congenital malformations of intestine: Secondary | ICD-10-CM | POA: Diagnosis not present

## 2023-04-28 DIAGNOSIS — D124 Benign neoplasm of descending colon: Secondary | ICD-10-CM | POA: Diagnosis not present

## 2023-04-28 DIAGNOSIS — Z860101 Personal history of adenomatous and serrated colon polyps: Secondary | ICD-10-CM

## 2023-04-28 DIAGNOSIS — I5042 Chronic combined systolic (congestive) and diastolic (congestive) heart failure: Secondary | ICD-10-CM | POA: Insufficient documentation

## 2023-04-28 DIAGNOSIS — Z860100 Personal history of colon polyps, unspecified: Secondary | ICD-10-CM

## 2023-04-28 DIAGNOSIS — E6689 Other obesity not elsewhere classified: Secondary | ICD-10-CM | POA: Diagnosis not present

## 2023-04-28 DIAGNOSIS — Z95810 Presence of automatic (implantable) cardiac defibrillator: Secondary | ICD-10-CM | POA: Diagnosis not present

## 2023-04-28 DIAGNOSIS — Z7901 Long term (current) use of anticoagulants: Secondary | ICD-10-CM | POA: Insufficient documentation

## 2023-04-28 DIAGNOSIS — Z1211 Encounter for screening for malignant neoplasm of colon: Secondary | ICD-10-CM | POA: Diagnosis present

## 2023-04-28 DIAGNOSIS — I110 Hypertensive heart disease with heart failure: Secondary | ICD-10-CM | POA: Insufficient documentation

## 2023-04-28 DIAGNOSIS — I4891 Unspecified atrial fibrillation: Secondary | ICD-10-CM | POA: Diagnosis not present

## 2023-04-28 DIAGNOSIS — D126 Benign neoplasm of colon, unspecified: Secondary | ICD-10-CM | POA: Diagnosis not present

## 2023-04-28 DIAGNOSIS — I480 Paroxysmal atrial fibrillation: Secondary | ICD-10-CM

## 2023-04-28 DIAGNOSIS — Z7902 Long term (current) use of antithrombotics/antiplatelets: Secondary | ICD-10-CM

## 2023-04-28 HISTORY — PX: POLYPECTOMY: SHX5525

## 2023-04-28 HISTORY — PX: COLONOSCOPY WITH PROPOFOL: SHX5780

## 2023-04-28 SURGERY — COLONOSCOPY WITH PROPOFOL
Anesthesia: Monitor Anesthesia Care

## 2023-04-28 MED ORDER — LIDOCAINE 2% (20 MG/ML) 5 ML SYRINGE
INTRAMUSCULAR | Status: DC | PRN
  Administered 2023-04-28: 80 mg via INTRAVENOUS

## 2023-04-28 MED ORDER — PROPOFOL 500 MG/50ML IV EMUL
INTRAVENOUS | Status: DC | PRN
  Administered 2023-04-28: 125 ug/kg/min via INTRAVENOUS

## 2023-04-28 MED ORDER — SODIUM CHLORIDE 0.9 % IV SOLN
INTRAVENOUS | Status: DC
Start: 2023-04-28 — End: 2023-04-28

## 2023-04-28 MED ORDER — PROPOFOL 10 MG/ML IV BOLUS
INTRAVENOUS | Status: DC | PRN
  Administered 2023-04-28: 60 mg via INTRAVENOUS
  Administered 2023-04-28: 40 mg via INTRAVENOUS

## 2023-04-28 SURGICAL SUPPLY — 22 items

## 2023-04-28 NOTE — Anesthesia Postprocedure Evaluation (Signed)
Anesthesia Post Note  Patient: Alexander Higgins  Procedure(s) Performed: COLONOSCOPY WITH PROPOFOL POLYPECTOMY     Patient location during evaluation: PACU Anesthesia Type: MAC Level of consciousness: awake and alert, patient cooperative and oriented Pain management: pain level controlled Vital Signs Assessment: post-procedure vital signs reviewed and stable Respiratory status: spontaneous breathing, nonlabored ventilation and respiratory function stable Cardiovascular status: stable and blood pressure returned to baseline Postop Assessment: no apparent nausea or vomiting and able to ambulate Anesthetic complications: no   There were no known notable events for this encounter.  Last Vitals:  Vitals:   04/28/23 0850 04/28/23 0905  BP: 102/75 (!) 144/97  Pulse: 69 (!) 58  Resp: 12 18  Temp: (!) 36.1 C (!) 36.1 C  SpO2: 95% 95%    Last Pain: There were no vitals filed for this visit.               Germaine Pomfret

## 2023-04-28 NOTE — Anesthesia Preprocedure Evaluation (Addendum)
Anesthesia Evaluation  Patient identified by MRN, date of birth, ID band Patient awake    Reviewed: Allergy & Precautions, NPO status , Patient's Chart, lab work & pertinent test results  History of Anesthesia Complications Negative for: history of anesthetic complications  Airway Mallampati: II  TM Distance: >3 FB Neck ROM: Full    Dental  (+) Partial Lower, Partial Upper, Dental Advisory Given   Pulmonary neg pulmonary ROS   breath sounds clear to auscultation       Cardiovascular hypertension, Pt. on medications (-) angina +CHF  + dysrhythmias Atrial Fibrillation + Cardiac Defibrillator  Rhythm:Regular Rate:Normal  '17 ECHO:  - Left ventricle: The cavity size was normal. There was moderate   concentric hypertrophy. Systolic function was moderately to   severely reduced. The estimated ejection fraction was in the   range of 30% to 35%. Diffuse hypokinesis. Doppler parameters are   consistent with abnormal left ventricular relaxation (grade 1   diastolic dysfunction).  - Mitral valve: Mildly thickened leaflets . There was trivial   regurgitation. - Left atrium: The atrium was normal in size. - Right ventricle: The cavity size was mildly dilated. Pacer wire   or catheter noted in right ventricle. Systolic function was   normal. - Right atrium: The atrium was mildly dilated. Pacer wire or   catheter noted in right atrium. - Tricuspid valve: There was mild regurgitation     Neuro/Psych negative neurological ROS     GI/Hepatic negative GI ROS, Neg liver ROS,,,  Endo/Other    Class 4 obesity  Renal/GU negative Renal ROS     Musculoskeletal  (+) Arthritis ,    Abdominal   Peds  Hematology Eliquis   Anesthesia Other Findings   Reproductive/Obstetrics                             Anesthesia Physical Anesthesia Plan  ASA: 4  Anesthesia Plan: MAC   Post-op Pain Management: Minimal  or no pain anticipated   Induction:   PONV Risk Score and Plan: 1 and Treatment may vary due to age or medical condition  Airway Management Planned: Natural Airway and Simple Face Mask  Additional Equipment: None  Intra-op Plan:   Post-operative Plan:   Informed Consent: I have reviewed the patients History and Physical, chart, labs and discussed the procedure including the risks, benefits and alternatives for the proposed anesthesia with the patient or authorized representative who has indicated his/her understanding and acceptance.     Dental advisory given  Plan Discussed with: CRNA and Surgeon  Anesthesia Plan Comments:         Anesthesia Quick Evaluation

## 2023-04-28 NOTE — Op Note (Signed)
[facility] Patient Name: Alexander Higgins Procedure Date : 04/28/2023 MRN: [id number] Attending MD: Starr Lake. Myrtie Neither , MD, [id number] Date of Birth: 02/25/1946 CSN: [id number] Age: 77 Admit Type: Outpatient Procedure:                Colonoscopy Indications:              Surveillance: Personal history of adenomatous                            polyps on last colonoscopy 3 years ago                           Subcentimeter tubular adenoma x 16 January 2020                           Subcentimeter tubular adenoma 2015 Providers:                Starr Lake. Myrtie Neither, MD, Suzy Bouchard, RN, Weston Settle, RN, Rozetta Nunnery, Technician Referring MD:              Medicines:                Monitored Anesthesia Care Complications:            No immediate complications. Estimated Blood Loss:     Estimated blood loss was minimal. Procedure:                Pre-Anesthesia Assessment:                           - Prior to the procedure, a History and Physical                            was performed, and patient medications and                            allergies were reviewed. The patient's tolerance of                            previous anesthesia was also reviewed. The risks                            and benefits of the procedure and the sedation                            options and risks were discussed with the patient.                            All questions were answered, and informed consent                            was obtained. Prior Anticoagulants: The patient has  taken Eliquis (apixaban), last dose was 2 days                            prior to procedure. ASA Grade Assessment: IV - A                            patient with severe systemic disease that is a                            constant threat to life. (Decreased LVEF) After                            reviewing the risks and benefits, the patient was                             deemed in satisfactory condition to undergo the                            procedure.                           After obtaining informed consent, the colonoscope                            was passed under direct vision. Throughout the                            procedure, the patient's blood pressure, pulse, and                            oxygen saturations were monitored continuously. The                            CF-HQ190L ([id number] Olympus colonoscope was                            introduced through the anus and advanced to the the                            cecum, identified by the ileocecal valve and the                            cecal strap. Appendiceal orifice could not be                            visualized due to poor preparation. The colonoscopy                            was somewhat difficult due to a redundant colon.                            Successful completion of the procedure was aided by  using manual pressure and straightening and                            shortening the scope to obtain bowel loop                            reduction. The patient tolerated the procedure                            well. The quality of the bowel preparation was                            poor. The ileocecal valve, cecum base and the                            rectum were photographed. Scope In: 8:33:53 AM Scope Out: 8:46:16 AM Scope Withdrawal Time: 0 hours 8 minutes 16 seconds  Total Procedure Duration: 0 hours 12 minutes 23 seconds  Findings:      The perianal and digital rectal examinations were normal.      Copious quantities of stool with opaque liquid and a large amount of       fibrous material was found in the entire colon, precluding adequate       visualization. Lavage unsuccessful due to the fibrous nature retained       material (see photos).      A 4 mm polyp was found in the proximal descending colon. The polyp was       sessile.  The polyp was removed with a cold snare. Resection and       retrieval were complete.      The sigmoid colon was redundant.      The exam was otherwise without abnormality on direct and retroflexion       views. (Given limitations of bowel preparation) Impression:               - Preparation of the colon was poor.                           - Stool in the entire examined colon.                           - One 4 mm polyp in the proximal descending colon,                            removed with a cold snare. Resected and retrieved.                           - Redundant colon.                           - The examination was otherwise normal on direct                            and retroflexion views. Recommendation:           - Patient has a contact number available for  emergencies. The signs and symptoms of potential                            delayed complications were discussed with the                            patient. Return to normal activities tomorrow.                            Written discharge instructions were provided to the                            patient.                           - Resume previous diet.                           - Resume Eliquis (apixaban) at prior dose tomorrow.                           - Await pathology results.                           - Repeat colonoscopy within 6-9 months for                            surveillance with a better prep. For next                            colonoscopy, closer attention to preprocedure                            dietary restrictions regarding fibrous foods,                            preparation with Dulcolax plus split dose GoLytely,                            later a.m. procedure slot. Procedure Code(s):        --- Professional ---                           [phone], Colonoscopy, flexible; with removal of                            tumor(s), polyp(s), or other lesion(s) by snare                             technique Diagnosis Code(s):        --- Professional ---                           Z86.010, Personal history of colonic polyps  D12.4, Benign neoplasm of descending colon                           Q43.8, Other specified congenital malformations of                            intestine CPT copyright 2022 American Medical Association. All rights reserved. The codes documented in this report are preliminary and upon coder review may  be revised to meet current compliance requirements. Keyton Bhat L. Myrtie Neither, MD 04/28/2023 8:55:54 AM This report has been signed electronically. Number of Addenda: 0

## 2023-04-28 NOTE — Discharge Instructions (Signed)
Please resume your Eliquis (apixaban) at usual dose tomorrow morning (04/29/23).  _______________________________

## 2023-04-28 NOTE — Interval H&P Note (Signed)
History and Physical Interval Note:  04/28/2023 8:15 AM  Alexander Higgins  has presented today for surgery, with the diagnosis of hx of colon polyps.  The various methods of treatment have been discussed with the patient and family. After consideration of risks, benefits and other options for treatment, the patient has consented to  Procedure(s): COLONOSCOPY WITH PROPOFOL (N/A) as a surgical intervention.  The patient's history has been reviewed, patient examined, no change in status, stable for surgery.  I have reviewed the patient's chart and labs.  Questions were answered to the patient's satisfaction.     [name]

## 2023-04-28 NOTE — Transfer of Care (Signed)
Immediate Anesthesia Transfer of Care Note  Patient: Alexander Higgins  Procedure(s) Performed: COLONOSCOPY WITH PROPOFOL POLYPECTOMY  Patient Location: PACU  Anesthesia Type:MAC  Level of Consciousness: awake, alert , and oriented  Airway & Oxygen Therapy: Patient Spontanous Breathing and Patient connected to nasal cannula oxygen  Post-op Assessment: Report given to RN and Post -op Vital signs reviewed and stable  Post vital signs: Reviewed and stable  Last Vitals:  Vitals Value Taken Time  BP 102/75 04/28/23 0851  Temp    Pulse 63 04/28/23 0856  Resp 15 04/28/23 0856  SpO2 90 % 04/28/23 0856  Vitals shown include unfiled device data.  Last Pain: There were no vitals filed for this visit.       Complications: No notable events documented.

## 2023-04-28 NOTE — H&P (Signed)
  History and Physical:  This patient presents for endoscopic testing for:   77 year old man here today for a surveillance colonoscopy.  Clinical details of his medical history and GI history Arrien my 03/08/2023 office consult note, with no significant clinical changes since then.  In August 2021, a colonoscopy found for subcentimeter tubular adenomas.  He also had a subcentimeter tubular adenoma on a 2015 colonoscopy.  He is on Eliquis for atrial fibrillation, has a defibrillator, and has a decreased LVEF (the latter being why he is having his procedure in the hospital-based outpatient endoscopy lab today).  Patient is otherwise without complaints or active issues today.   Past Medical History: Past Medical History:  Diagnosis Date   AICD (automatic cardioverter/defibrillator) present    Arthritis    "right knee" (04/23/2015)   Atrial fibrillation (HCC)    a. identified on device check in 07/2016.   Chronic combined systolic and diastolic CHF (congestive heart failure) (HCC) dx'd 11/2014   a. 05/2016: echo showing EF of 30-35%, Grade 1 DD, trivial MR, mild TR, PA peak pressure 17 mm Hg.    Hyperlipidemia    Hypertension    Nonischemic cardiomyopathy (HCC)    a. s/p ICD placement in 04/2015     Past Surgical History: Past Surgical History:  Procedure Laterality Date   BIOPSY  01/22/2020   Procedure: BIOPSY;  Surgeon: Sherrilyn Rist, MD;  Location: [facility];  Service: Gastroenterology;;   COLONOSCOPY WITH PROPOFOL N/A 01/22/2020   Procedure: COLONOSCOPY WITH PROPOFOL;  Surgeon: Sherrilyn Rist, MD;  Location: [facility];  Service: Gastroenterology;  Laterality: N/A;   EP IMPLANTABLE DEVICE N/A 04/23/2015   Procedure: ICD Implant;  Surgeon: Thurmon Fair, MD;  Location: [facility];  Service: Cardiovascular;  Laterality: N/A;   INSERTION OF ICD  04/23/2015   POLYPECTOMY  01/22/2020   Procedure: POLYPECTOMY;  Surgeon: Sherrilyn Rist, MD;  Location: [facility];   Service: Gastroenterology;;   TONSILLECTOMY      Allergies: Allergies  Allergen Reactions   Jardiance [Empagliflozin] Itching    Outpatient Meds: Current Facility-Administered Medications  Medication Dose Route Frequency Provider Last Rate Last Admin   0.9 %  sodium chloride infusion   Intravenous Continuous Danis, Starr Lake III, MD          ___________________________________________________________________ Objective   Exam:  There were no vitals taken for this visit.  CV: regular , S1/S2, AICD upper chest wall Resp: clear to auscultation bilaterally, normal RR and effort noted GI: soft, no tenderness, with active bowel sounds.   Assessment: History of colon polyps   Plan: Colonoscopy   The benefits and risks of the planned procedure were described in detail with the patient or (when appropriate) their health care proxy.  Risks were outlined as including, but not limited to, bleeding, infection, perforation, adverse medication reaction leading to cardiac or pulmonary decompensation, pancreatitis (if ERCP).  The limitation of incomplete mucosal visualization was also discussed.  No guarantees or warranties were given.  The patient is appropriate for an endoscopic procedure in the ambulatory setting.   - Amada Jupiter, MD

## 2023-04-29 ENCOUNTER — Encounter (HOSPITAL_COMMUNITY): Payer: Self-pay | Admitting: Gastroenterology

## 2023-04-29 LAB — SURGICAL PATHOLOGY

## 2023-05-03 ENCOUNTER — Encounter: Payer: Self-pay | Admitting: Gastroenterology

## 2023-06-16 ENCOUNTER — Ambulatory Visit: Payer: Medicare PPO | Admitting: Gastroenterology

## 2023-07-05 ENCOUNTER — Ambulatory Visit (INDEPENDENT_AMBULATORY_CARE_PROVIDER_SITE_OTHER): Payer: Medicaid Other

## 2023-07-05 DIAGNOSIS — I428 Other cardiomyopathies: Secondary | ICD-10-CM | POA: Diagnosis not present

## 2023-07-05 DIAGNOSIS — I5042 Chronic combined systolic (congestive) and diastolic (congestive) heart failure: Secondary | ICD-10-CM

## 2023-07-05 LAB — CUP PACEART REMOTE DEVICE CHECK
Battery Remaining Longevity: 78 mo
Battery Remaining Percentage: 84 %
Brady Statistic RA Percent Paced: 36 %
Brady Statistic RV Percent Paced: 0 %
Date Time Interrogation Session: 20250121011500
HighPow Impedance: 79 Ohm
Implantable Lead Connection Status: 753985
Implantable Lead Connection Status: 753985
Implantable Lead Implant Date: 20161109
Implantable Lead Implant Date: 20161109
Implantable Lead Location: 753859
Implantable Lead Location: 753860
Implantable Lead Model: 293
Implantable Lead Model: 7741
Implantable Lead Serial Number: 385866
Implantable Lead Serial Number: 710559
Implantable Pulse Generator Implant Date: 20161109
Lead Channel Impedance Value: 486 Ohm
Lead Channel Impedance Value: 735 Ohm
Lead Channel Pacing Threshold Amplitude: 0.1 V
Lead Channel Pacing Threshold Amplitude: 1.4 V
Lead Channel Pacing Threshold Pulse Width: 0.4 ms
Lead Channel Pacing Threshold Pulse Width: 0.6 ms
Lead Channel Setting Pacing Amplitude: 2.4 V
Lead Channel Setting Pacing Amplitude: 2.5 V
Lead Channel Setting Pacing Pulse Width: 0.4 ms
Lead Channel Setting Sensing Sensitivity: 0.6 mV
Pulse Gen Serial Number: 512157
Zone Setting Status: 755011

## 2023-07-06 ENCOUNTER — Encounter: Payer: Self-pay | Admitting: Cardiovascular Disease

## 2023-07-07 ENCOUNTER — Encounter: Payer: Self-pay | Admitting: Cardiovascular Disease

## 2023-07-08 ENCOUNTER — Telehealth: Payer: Self-pay | Admitting: Cardiovascular Disease

## 2023-07-08 ENCOUNTER — Other Ambulatory Visit: Payer: Self-pay | Admitting: Cardiovascular Disease

## 2023-07-08 MED ORDER — VALSARTAN 320 MG PO TABS: 320.0000 mg | ORAL_TABLET | Freq: Every day | ORAL | 0 refills | Status: DC

## 2023-07-08 NOTE — Telephone Encounter (Signed)
 *  STAT* If patient is at the pharmacy, call can be transferred to refill team.   1. Which medications need to be refilled? (please list name of each medication and dose if known)   valsartan (DIOVAN) 320 MG tablet     2. Would you like to learn more about the convenience, safety, & potential cost savings by using the [facility]?    3. Are you open to using the [facility] (Type [facility].    4. Which pharmacy/location (including street and city if local pharmacy) is medication to be sent to?  [facility] [address] [place], [place] - [address]     5. Do they need a 30 day or 90 day supply? 90 days   Pt is out of medications need refill today

## 2023-08-15 NOTE — Progress Notes (Signed)
 Remote ICD transmission.

## 2023-09-01 ENCOUNTER — Encounter: Payer: Self-pay | Admitting: Cardiovascular Disease

## 2023-09-01 ENCOUNTER — Ambulatory Visit: Payer: Medicare PPO | Attending: Cardiovascular Disease | Admitting: Cardiovascular Disease

## 2023-09-01 VITALS — BP 102/68 | HR 65 | Ht 72.0 in | Wt 308.2 lb

## 2023-09-01 DIAGNOSIS — Z95810 Presence of automatic (implantable) cardiac defibrillator: Secondary | ICD-10-CM | POA: Diagnosis not present

## 2023-09-01 DIAGNOSIS — D6869 Other thrombophilia: Secondary | ICD-10-CM

## 2023-09-01 DIAGNOSIS — I10 Essential (primary) hypertension: Secondary | ICD-10-CM

## 2023-09-01 DIAGNOSIS — I428 Other cardiomyopathies: Secondary | ICD-10-CM | POA: Diagnosis not present

## 2023-09-01 DIAGNOSIS — I480 Paroxysmal atrial fibrillation: Secondary | ICD-10-CM | POA: Diagnosis not present

## 2023-09-01 DIAGNOSIS — I5042 Chronic combined systolic (congestive) and diastolic (congestive) heart failure: Secondary | ICD-10-CM | POA: Diagnosis not present

## 2023-09-01 DIAGNOSIS — I4729 Other ventricular tachycardia: Secondary | ICD-10-CM

## 2023-09-01 MED ORDER — ROSUVASTATIN CALCIUM 20 MG PO TABS: 20.0000 mg | ORAL_TABLET | Freq: Every day | ORAL | 3 refills | Status: AC

## 2023-09-01 NOTE — Progress Notes (Signed)
 Patient ID: Alexander Higgins, male   DOB: [birth year], 78 y.o.   MRN: [id number]     Cardiology Office Note    Date:  09/03/2023   ID:  Alexander Higgins, DOB 09-Feb-1946, MRN [id number]  PCP:  Barron Alvine, MD  Cardiologist:   Nanetta Batty, M.D.; Thurmon Fair, MD   Chief complaint:  Follow-up CHF. ICD, atrial fibrillation   History of Present Illness:  Alexander Higgins is a 78 y.o. male , morbidly obese, roughly 9 years status post implantation of a [employer] dual-chamber defibrillator for severe nonischemic cardiomyopathy with a left ventricular ejection fraction of approximately 30% and symptomatic sinus bradycardia.  He has a history of paroxysmal atrial fibrillation detected by his device, but always asymptomatic.  He has never received VT/VF therapies from his device.  Unfortunately, Mr. Nurse has been plagued by persistent postherpetic neuralgia from an episode of shingles that occurred 4 months ago.  Most of the discomfort is in his right axillary area.  Also has a lot of problems with right knee pain due to degenerative arthritis.  These issues have really prevented him from exercising and he feels that he has gained weight because of this.  He has not had shortness of breath or chest pain with the activity that he has been able to perform.  He denies orthopnea, PND, lower extremity edema, palpitations, syncope or defibrillator discharges.  He has a [employer] ICD that was implanted in 2016 and still has roughly 6 years of estimated longevity.  Presenting rhythm is atrial sensed-ventricular sensed (sinus rhythm).  Frequent PACs and frequent PVCs are seen during device interrogation.  The ventricular lead has excellent sensing (R wave 15.1 mV) and excellent impedance (528 ohms), but the capture threshold has increased to 1.4 V at 1.0 ms.  He only has 14% ventricular pacing.  The atrial lead has stable parameters with sensed P waves of 1.5 mV, impedance of 764 ohms and  a capture threshold of 1.2 V at 0.6 ms.  His device has recorded 7 episodes of nonsustained VT since his last device download, most of them very brief.  This is similar to his previous pattern of having nonsustained VT roughly every 1 or 2 months.  He has never received VT therapy.  He has not had any atrial fibrillation.  The heart rate histogram distribution is good.  Ventricular lead pacing output was adjusted to account for the increased threshold, but no other changes are necessary today.  He has not had any recent episodes of heart failure exacerbation has not required diuretic dose changes.  He is on the maximum tolerated dose of carvedilol, limited by blood pressure.  In the past he was on Entresto but this had to be stopped due to cost constraints so he is now on the maximum dose of valsartan.  Did not tolerate SGLT2 inhibitor due to severe groin yeast infections.  His blood pressure also prevents the use of aldosterone antagonists.   Labs performed 05/21/2023 showed a proBNP that was very low at 185. Hemoglobin 15.8, normal electrolytes including potassium 3.6, normal renal function with creatinine 1.2 (GFR 63), mildly elevated ALT at 62 but normal AST 44 normal bilirubin and albumin.   His most recent lipid profile is from 05/04/2023 and looked pretty good with cholesterol 159, HDL 43, LDL 85 and triglycerides 155.  On the same date TSH was normal at 2.45  Past Medical History:  Diagnosis Date   AICD (automatic cardioverter/defibrillator) present    Arthritis    "  right knee" (04/23/2015)   Atrial fibrillation (HCC)    a. identified on device check in 07/2016.   Chronic combined systolic and diastolic CHF (congestive heart failure) (HCC) dx'd 11/2014   a. 05/2016: echo showing EF of 30-35%, Grade 1 DD, trivial MR, mild TR, PA peak pressure 17 mm Hg.    Hyperlipidemia    Hypertension    Nonischemic cardiomyopathy (HCC)    a. s/p ICD placement in 04/2015    Past Surgical History:   Procedure Laterality Date   BIOPSY  01/22/2020   Procedure: BIOPSY;  Surgeon: Sherrilyn Rist, MD;  Location: [facility];  Service: Gastroenterology;;   COLONOSCOPY WITH PROPOFOL N/A 01/22/2020   Procedure: COLONOSCOPY WITH PROPOFOL;  Surgeon: Sherrilyn Rist, MD;  Location: [facility];  Service: Gastroenterology;  Laterality: N/A;   COLONOSCOPY WITH PROPOFOL N/A 04/28/2023   Procedure: COLONOSCOPY WITH PROPOFOL;  Surgeon: Sherrilyn Rist, MD;  Location: [facility] ENDOSCOPY;  Service: Gastroenterology;  Laterality: N/A;   EP IMPLANTABLE DEVICE N/A 04/23/2015   Procedure: ICD Implant;  Surgeon: Thurmon Fair, MD;  Location: [facility] INVASIVE CV LAB;  Service: Cardiovascular;  Laterality: N/A;   INSERTION OF ICD  04/23/2015   POLYPECTOMY  01/22/2020   Procedure: POLYPECTOMY;  Surgeon: Sherrilyn Rist, MD;  Location: [facility] ENDOS[facility];  Service: Gastroenterology;;   POLYPECTOMY  04/28/2023   Procedure: POLYPECTOMY;  Surgeon: Sherrilyn Rist, MD;  Location: [facility] ENDOSCOPY;  Service: Gastroenterology;;   TONSILLECTOMY      Current Outpatient Medications  Medication Sig Dispense Refill   apixaban (ELIQUIS) 5 MG TABS tablet Take 1 tablet by mouth twice daily (Patient taking differently: Patient cut down to one daily) 180 tablet 1   carvedilol (COREG) 12.5 MG tablet Take 1 tablet (12.5 mg total) by mouth 2 (two) times daily with a meal. 180 tablet 4   furosemide (LASIX) 40 MG tablet Take 1 tablet by mouth twice daily 180 tablet 3   oxyCODONE (OXY IR/ROXICODONE) 5 MG immediate release tablet Take 5 mg by mouth every 6 (six) hours as needed.     valsartan (DIOVAN) 320 MG tablet Take 1 tablet (320 mg total) by mouth daily. 90 tablet 0   rosuvastatin (CRESTOR) 20 MG tablet Take 1 tablet (20 mg total) by mouth at bedtime. 90 tablet 3   No current facility-administered medications for this visit.    Allergies:   Jardiance [empagliflozin]   Family History:  The patient's family history includes  Emphysema in his father; Heart attack in his brother; Heart disease in his mother; Hypertension in his brother and mother; Stroke in his sister.    PHYSICAL EXAM:   VS:  BP 102/68 (BP Location: Left Arm, Patient Position: Sitting, Cuff Size: Large)   Pulse 65   Ht 6' (1.829 m)   Wt (!) 308 lb 3.2 oz (139.8 kg)   SpO2 93%   BMI 41.80 kg/m      General: Alert, oriented x3, no distress, morbidly obese, but also quite muscular Head: no evidence of trauma, PERRL, EOMI, no exophtalmos or lid lag, no myxedema, no xanthelasma; normal ears, nose and oropharynx Neck: normal jugular venous pulsations and no hepatojugular reflux; brisk carotid pulses without delay and no carotid bruits Chest: clear to auscultation, no signs of consolidation by percussion or palpation, normal fremitus, symmetrical and full respiratory excursions Cardiovascular: normal position and quality of the apical impulse, regular rhythm, normal first and second heart sounds, no murmurs, rubs or gallops Abdomen:  no tenderness or distention, no masses by palpation, no abnormal pulsatility or arterial bruits, normal bowel sounds, no hepatosplenomegaly Extremities: no clubbing, cyanosis or edema; 2+ radial, ulnar and brachial pulses bilaterally; 2+ right femoral, posterior tibial and dorsalis pedis pulses; 2+ left femoral, posterior tibial and dorsalis pedis pulses; no subclavian or femoral bruits Neurological: grossly nonfocal Psych: Normal mood and affect     Wt Readings from Last 3 Encounters:  09/01/23 (!) 308 lb 3.2 oz (139.8 kg)  03/08/23 (!) 307 lb (139.3 kg)  08/23/22 (!) 311 lb 9.6 oz (141.3 kg)      Studies/Labs Reviewed:   EKG:    EKG Interpretation Date/Time:  Thursday September 01 2023 08:49:24 EDT Ventricular Rate:  65 PR Interval:  158 QRS Duration:  86 QT Interval:  420 QTC Calculation: 436 R Axis:   -27  Text Interpretation: Atrial paced rhythm alternating with sinus rhythm with occasional Premature  ventricular complexes When compared with ECG of 24-Apr-2015 04:35, QRS axis Shifted left Criteria for Anterior infarct are no longer Present Criteria for Anterolateral infarct are no longer Present Confirmed by Makisha Marrin ([id number]) on 09/01/2023 8:51:29 AM         Recent Labs: No results found for requested labs within last 365 days.   Lipid Panel    Component Value Date/Time   CHOL 204 (H) 02/15/2019 1443   TRIG 154.0 (H) 02/15/2019 1443   HDL 36.80 (L) 02/15/2019 1443   CHOLHDL 6 02/15/2019 1443   VLDL 30.8 02/15/2019 1443   LDLCALC 136 (H) 02/15/2019 1443    05/21/2023  proBNP  very low at 185 Hemoglobin 15.8, normal electrolytes including potassium 3.6, normal renal function with creatinine 1.2 (GFR 63), mildly elevated ALT at 62 but normal AST 44 normal bilirubin and albumin.   05/04/2023  cholesterol 159, HDL 43, LDL 85,triglycerides 155.   TSH 2.45  ASSESSMENT:    1. Essential hypertension   2. Morbid obesity (HCC)      PLAN:  In order of problems listed above:  CHF: Euvolemic without loop diuretics, NYHA functional class I.  Did not tolerate SGLT2 inhibitors due to increased irritation in the groin and genital area.  Blood pressure limits aldosterone antagonist and not on Entresto due to cost problems, but he seems to be doing pretty well on valsartan.  Nonischemic cardiomyopathy with most recent LVEF 30-35% but has not been reevaluated since 2017.   ICD: Normal device function.  Continue remote downloads every 3 months.  There has been an increase in ventricular lead capture threshold, but sensing remains excellent and the impedance is stable. Afib: None has been seen recently.   CHADSVasc 4 (age 74, HF, HTN), on anticoagulant. NSVT: Continue to occur every month or 2, consistently asymptomatic.  He has never received tachycardia therapies from his device. Eliquis: No bleeding problems HTN: Pretty low on the current medications, but he has no symptoms of  hypotension. HLP: LDL cholesterol is high.  Recommend increasing the rosuvastatin to 20 mg daily. Obesity: Note that BNP may not accurately reflect his true volume status, but it does correlate with his completely asymptomatic clinical status.  He inquires about Wegovy and I think he is an excellent candidate for this, if we can get insurance coverage.  Medication Adjustments/Labs and Tests Ordered: Current medicines are reviewed at length with the patient today.  Concerns regarding medicines are outlined above.  Medication changes, Labs and Tests ordered today are listed below. Patient Instructions  Medication Instructions:  Your physician  has recommended you make the following change in your medication:   Increase Rosuvastatin 20mg  daily   We will set you up with our PharmD team to initiate [facility]!    Follow-Up: At [facility], you and your health needs are our priority.  As part of our continuing mission to provide you with exceptional heart care, we have created designated Provider Care Teams.  These Care Teams include your primary Cardiologist (physician) and Advanced Practice Providers (APPs -  Physician Assistants and Nurse Practitioners) who all work together to provide you with the care you need, when you need it.  We recommend signing up for the patient portal called "MyChart".  Sign up information is provided on this After Visit Summary.  MyChart is used to connect with patients for Virtual Visits (Telemedicine).  Patients are able to view lab/test results, encounter notes, upcoming appointments, etc.  Non-urgent messages can be sent to your provider as well.   To learn more about what you can do with MyChart, go to [email or web address].    Your next appointment:    PharmD referral for [facility] start   1 year with Dr. Rubie Maid       Signed, Thurmon Fair, MD  09/03/2023 6:25 PM    [facility] [address] [place], [place], [place]   [address] Phone: [phone]; Fax: [phone]

## 2023-09-01 NOTE — Patient Instructions (Addendum)
 Medication Instructions:  Your physician has recommended you make the following change in your medication:   Increase Rosuvastatin 20mg  daily   We will set you up with our PharmD team to initiate [facility]!    Follow-Up: At [facility], you and your health needs are our priority.  As part of our continuing mission to provide you with exceptional heart care, we have created designated Provider Care Teams.  These Care Teams include your primary Cardiologist (physician) and Advanced Practice Providers (APPs -  Physician Assistants and Nurse Practitioners) who all work together to provide you with the care you need, when you need it.  We recommend signing up for the patient portal called "MyChart".  Sign up information is provided on this After Visit Summary.  MyChart is used to connect with patients for Virtual Visits (Telemedicine).  Patients are able to view lab/test results, encounter notes, upcoming appointments, etc.  Non-urgent messages can be sent to your provider as well.   To learn more about what you can do with MyChart, go to [email or web address].    Your next appointment:    PharmD referral for [facility] start   1 year with Dr. Rubie Maid

## 2023-09-09 ENCOUNTER — Encounter: Payer: Self-pay | Admitting: Pharmacist Clinician (PhC)/ Clinical Pharmacy Specialist

## 2023-09-09 ENCOUNTER — Encounter: Payer: Self-pay | Admitting: Cardiovascular Disease

## 2023-09-16 ENCOUNTER — Encounter: Payer: Self-pay | Admitting: Pharmacist Clinician (PhC)/ Clinical Pharmacy Specialist

## 2023-09-16 ENCOUNTER — Telehealth: Payer: Self-pay | Admitting: Pharmacist Clinician (PhC)/ Clinical Pharmacy Specialist

## 2023-09-16 ENCOUNTER — Ambulatory Visit: Attending: Internal Medicine | Admitting: Pharmacist Clinician (PhC)/ Clinical Pharmacy Specialist

## 2023-09-16 VITALS — Ht 72.0 in | Wt 308.0 lb

## 2023-09-16 DIAGNOSIS — Z6841 Body Mass Index (BMI) 40.0 and over, adult: Secondary | ICD-10-CM | POA: Diagnosis not present

## 2023-09-16 DIAGNOSIS — E66813 Obesity, class 3: Secondary | ICD-10-CM | POA: Diagnosis not present

## 2023-09-16 DIAGNOSIS — E669 Obesity, unspecified: Secondary | ICD-10-CM | POA: Insufficient documentation

## 2023-09-16 NOTE — Telephone Encounter (Signed)
 Please see if Wegovy or Zepbound would be covered.  Pt has no indications other than obesity

## 2023-09-16 NOTE — Patient Instructions (Signed)
 We will start the prior authorization process to get Wegovy covered by your insurance.   Shea Stakes for shingles pain - ask your primary MD if this might be helpful, it can only be done at specific medical offices)  TIPS FOR SUCCESS Write down the reasons why you want to lose weight and post it in a place where you'll see it often. Start small and work your way up. Keep in mind that it takes time to achieve goals, and small steps add up. Any additional movements help to burn calories. Taking the stairs rather than the elevator and parking at the far end of your parking lot are easy ways to start. Brisk walking for at least 30 minutes 4 or more days of the week is an excellent goal to work toward  [employer] WHAT IT MEANS TO FEEL FULL Did you know that it can take 15 minutes or more for your brain to receive the message that you've eaten? That means that, if you eat less food, but consume it slower, you may still feel satisfied. Eating a lot of fruits and vegetables can also help you feel fuller. Eat off of smaller plates so that moderate portions don't seem too small  TITRATION PLAN Will plan to follow the titration plan as below, pending patient is tolerating each dose before increasing to the next. Can slow titration if needed for tolerability.    -Weeks 1-4: Inject 0.25 mg SQ once weekly  -Weeks 5-8: Inject 0.5 mg SQ once weekly  -Weeks 9-12 Inject 1 mg SQ once weekly   Follow up in 3 months.  If you have any questions or concerns, please reach out to [place].  Francisco Ostrovsky/Chris at [phone].  THANK YOU FOR CHOOSING [facility]

## 2023-09-16 NOTE — Progress Notes (Signed)
 Office Visit    Patient Name: Alexander Higgins Date of Encounter: 09/16/2023  Primary Care Provider:  Barron Alvine, MD Primary Cardiologist:  Thurmon Fair, MD  Chief Complaint    Weight management  Significant Past Medical History   HFrEF On valsartan, carvedilol  NICM ICD placed 04/2015  AF CHADS2-VASc = 4, on Eliquis  HTN Well controlled with HF medications       Allergies  Allergen Reactions   Jardiance [Empagliflozin] Itching    History of Present Illness    Alexander Higgins is a 78 y.o. male patient of Dr Royann Shivers, in the office today to discuss options for weight management.   He has dieted in the past, with some success, but has not been able to maintain weight loss.    Current weight management medications: none  Previously tried meds: none  Current meds that may affect weight: none  Baseline weight/BMI: 139.7 kg // 41.76  Insurance payor: [employer] [phone]  Diet: no breads, no soda; does drink juice; was having smoothies for breakfast, then not hungry until 3 pm. Some fish and chicken; plenty of greens;  sausage and eggs for breakfast; cut back on snacking - mostly Nut[employer]rain bars  Exercise: just started back to gym (was out 2/2 shingles pain); strength training, will start back on cardio, but limited by bad knee;   Confirmed patient has no personal or family history of medullary thyroid carcinoma (MTC) or Multiple Endocrine Neoplasia syndrome type 2 (MEN 2).   Social History:   Tobacco: no  Alcohol: no  Caffeine: decaf usually    Accessory Clinical Findings    Lab Results  Component Value Date   CREATININE 1.12 04/02/2021   BUN 13 04/02/2021   NA 141 04/02/2021   K 3.7 04/02/2021   CL 104 04/02/2021   CO2 22 04/02/2021   Lab Results  Component Value Date   ALT 24 04/02/2021   AST 25 04/02/2021   ALKPHOS 58 04/02/2021   BILITOT 0.5 04/02/2021   No results found for: "HGBA1C"    Home Medications/Allergies    Current  Outpatient Medications  Medication Sig Dispense Refill   apixaban (ELIQUIS) 5 MG TABS tablet Take 1 tablet by mouth twice daily (Patient taking differently: Patient cut down to one daily) 180 tablet 1   carvedilol (COREG) 12.5 MG tablet Take 1 tablet (12.5 mg total) by mouth 2 (two) times daily with a meal. 180 tablet 4   furosemide (LASIX) 40 MG tablet Take 1 tablet by mouth twice daily 180 tablet 3   oxyCODONE (OXY IR/ROXICODONE) 5 MG immediate release tablet Take 5 mg by mouth every 6 (six) hours as needed.     rosuvastatin (CRESTOR) 20 MG tablet Take 1 tablet (20 mg total) by mouth at bedtime. 90 tablet 3   valsartan (DIOVAN) 320 MG tablet Take 1 tablet (320 mg total) by mouth daily. 90 tablet 0   No current facility-administered medications for this visit.     Allergies  Allergen Reactions   Jardiance [Empagliflozin] Itching    Assessment & Plan    Obesity  Patient has not met goal of at least 5% of body weight loss with comprehensive lifestyle modifications alone in the past 3-6 months. Pharmacotherapy is appropriate to pursue as augmentation. Will determine if Wegovy or Zepbound are covered on insurance plan.   Confirmed patient has no personal or family history of medullary thyroid carcinoma (MTC) or Multiple Endocrine Neoplasia syndrome type 2 (MEN 2).  Advised patient on common side effects including nausea, diarrhea, dyspepsia, decreased appetite, and fatigue. Counseled patient on reducing meal size and how to titrate medication to minimize side effects. Patient aware to call if intolerable side effects or if experiencing dehydration, abdominal pain, or dizziness. Patient will adhere to dietary modifications and will target at least 150 minutes of moderate intensity exercise weekly.   Injection technique reviewed at today's visit.    Titration Plan: - ZOXWRU Will plan to follow the titration plan as below, pending patient is tolerating each dose before increasing to the  next. Can slow titration if needed for tolerability.    -Month 1: Inject 0.25 mg SQ once weekly x 4 weeks -Month 2: Inject 0.5 mg SQ once weekly x 4 weeks -Month 3: Inject 1 mg SQ once weekly x 4 weeks -Month 4+: Inject 1.7 mg SQ once weekly   Follow up in 3 months.   Phillips Hay PharmD CPP [facility]  [address] [place], [place] [address] [phone]

## 2023-09-16 NOTE — Assessment & Plan Note (Signed)
  Patient has not met goal of at least 5% of body weight loss with comprehensive lifestyle modifications alone in the past 3-6 months. Pharmacotherapy is appropriate to pursue as augmentation. Will determine if Wegovy or Zepbound are covered on insurance plan.   Confirmed patient has no personal or family history of medullary thyroid carcinoma (MTC) or Multiple Endocrine Neoplasia syndrome type 2 (MEN 2).   Advised patient on common side effects including nausea, diarrhea, dyspepsia, decreased appetite, and fatigue. Counseled patient on reducing meal size and how to titrate medication to minimize side effects. Patient aware to call if intolerable side effects or if experiencing dehydration, abdominal pain, or dizziness. Patient will adhere to dietary modifications and will target at least 150 minutes of moderate intensity exercise weekly.   Injection technique reviewed at today's visit.    Titration Plan: - ZOXWRU Will plan to follow the titration plan as below, pending patient is tolerating each dose before increasing to the next. Can slow titration if needed for tolerability.    -Month 1: Inject 0.25 mg SQ once weekly x 4 weeks -Month 2: Inject 0.5 mg SQ once weekly x 4 weeks -Month 3: Inject 1 mg SQ once weekly x 4 weeks -Month 4+: Inject 1.7 mg SQ once weekly   Follow up in 3 months.

## 2023-09-29 ENCOUNTER — Other Ambulatory Visit: Payer: Self-pay | Admitting: Cardiovascular Disease

## 2023-09-29 DIAGNOSIS — I480 Paroxysmal atrial fibrillation: Secondary | ICD-10-CM

## 2023-09-29 NOTE — Telephone Encounter (Signed)
 Prescription refill request for Eliquis received. Indication: Afib  Last office visit:09/01/23 ([place])  Scr: 1.2 (05/21/23)  Age: 78 Weight: 139.7kg  Appropriate dose. Refill sent.

## 2023-09-30 ENCOUNTER — Encounter: Payer: Self-pay | Admitting: Cardiovascular Disease

## 2023-09-30 NOTE — Telephone Encounter (Signed)
 Daughter is calling to check the status of this message. Pt is out of his Elquis

## 2023-10-01 NOTE — Telephone Encounter (Signed)
 I think this is just the beginning of the year deductible shock. Could you please confirm and see if we can help in any way?

## 2023-10-04 ENCOUNTER — Ambulatory Visit (INDEPENDENT_AMBULATORY_CARE_PROVIDER_SITE_OTHER): Payer: Medicaid Other

## 2023-10-04 ENCOUNTER — Other Ambulatory Visit: Payer: Self-pay | Admitting: Cardiovascular Disease

## 2023-10-04 DIAGNOSIS — I428 Other cardiomyopathies: Secondary | ICD-10-CM

## 2023-10-05 LAB — CUP PACEART REMOTE DEVICE CHECK
Battery Remaining Longevity: 72 mo
Battery Remaining Percentage: 80 %
Brady Statistic RA Percent Paced: 41 %
Brady Statistic RV Percent Paced: 0 %
Date Time Interrogation Session: 20250422011400
HighPow Impedance: 81 Ohm
Implantable Lead Connection Status: 753985
Implantable Lead Connection Status: 753985
Implantable Lead Implant Date: 20161109
Implantable Lead Implant Date: 20161109
Implantable Lead Location: 753859
Implantable Lead Location: 753860
Implantable Lead Model: 293
Implantable Lead Model: 7741
Implantable Lead Serial Number: 385866
Implantable Lead Serial Number: 710559
Implantable Pulse Generator Implant Date: 20161109
Lead Channel Impedance Value: 454 Ohm
Lead Channel Impedance Value: 762 Ohm
Lead Channel Pacing Threshold Amplitude: 1.3 V
Lead Channel Pacing Threshold Amplitude: 1.4 V
Lead Channel Pacing Threshold Pulse Width: 0.6 ms
Lead Channel Pacing Threshold Pulse Width: 1 ms
Lead Channel Setting Pacing Amplitude: 2.5 V
Lead Channel Setting Pacing Amplitude: 2.8 V
Lead Channel Setting Pacing Pulse Width: 1 ms
Lead Channel Setting Sensing Sensitivity: 0.6 mV
Pulse Gen Serial Number: 512157
Zone Setting Status: 755011

## 2023-10-15 ENCOUNTER — Encounter: Payer: Self-pay | Admitting: Cardiovascular Disease

## 2023-10-21 ENCOUNTER — Other Ambulatory Visit: Payer: Self-pay | Admitting: Cardiovascular Disease

## 2023-10-24 ENCOUNTER — Telehealth: Payer: Self-pay

## 2023-10-24 NOTE — Telephone Encounter (Signed)
 Pt [facility] into [facility] requesting to speak to Alexander Higgins, Alexander Higgins pharmacist.  Pt states he saw Starling Eck and would like to speak with her regarding a medication she was looking into for him.  Please return call to pt.

## 2023-10-25 ENCOUNTER — Other Ambulatory Visit (HOSPITAL_COMMUNITY): Payer: Self-pay

## 2023-10-25 ENCOUNTER — Telehealth: Payer: Self-pay | Admitting: Pharmacy Technician

## 2023-10-25 NOTE — Telephone Encounter (Signed)
 Pharmacy Patient Advocate Encounter   Received notification from Pt Calls Messages that prior authorization for [facility] is required/requested.   Insurance verification completed.   The patient is insured through [employer] .   Per test claim: PA required; PA submitted to above mentioned insurance via CoverMyMeds Key/confirmation #/[id number] Status is pending

## 2023-10-26 NOTE — Telephone Encounter (Signed)
 Pharmacy Patient Advocate Encounter  Received notification from [employer] that Prior Authorization for wegovy has been DENIED.  Full denial letter will be uploaded to the media tab. See denial reason below.   PA #/Case ID/Reference #: [id number]

## 2023-11-02 ENCOUNTER — Telehealth: Payer: Self-pay | Admitting: Cardiovascular Disease

## 2023-11-02 NOTE — Telephone Encounter (Signed)
 Explained denial to patient and answered all questions.

## 2023-11-02 NOTE — Telephone Encounter (Signed)
 New Message:    Patient said Dr C said he was going to refer him to someone to help him with his weight loss. He says he still have not heard anything.

## 2023-11-02 NOTE — Telephone Encounter (Signed)
 Patient states he was told by Dr. Alvis Ba that his team would help him with getting prescribed Ozempic or [facility].  Patient met with pharmacist on 09/16/23. PA for [facility] was sent and denied (see phone note from 10/25/23).  Will forward to Pharm D to follow-up with patient regarding this.

## 2023-11-15 ENCOUNTER — Other Ambulatory Visit (HOSPITAL_COMMUNITY): Payer: Self-pay

## 2023-11-15 ENCOUNTER — Telehealth: Payer: Self-pay | Admitting: Pharmacy Technician

## 2023-11-15 ENCOUNTER — Telehealth: Payer: Self-pay | Admitting: Cardiovascular Disease

## 2023-11-15 NOTE — Telephone Encounter (Signed)
 Appeal denied.    Appeal scanned in media

## 2023-11-15 NOTE — Telephone Encounter (Addendum)
   Faxed insurance the information "re-send the [facility] request and tell them 1.  he has CHF, 2.  is on 2 medications for BP (lol - he says 4 meds for BP!) 3. weighs > 300 lbs  that the insurance has assured him he qualifies for medication.    I know there have been studies showing benefit of weight loss in CHF, but not yet FDA approved.  If you could copy the screen showing we list CHF as a secondary dx, then we can show him that we tried."  Let Amadeo Backers know

## 2023-11-17 NOTE — Progress Notes (Signed)
 Remote ICD transmission.

## 2023-11-17 NOTE — Addendum Note (Signed)
 Addended by: Lott Rouleau A on: 11/17/2023 02:51 PM   Modules accepted: Orders

## 2023-11-17 NOTE — Telephone Encounter (Signed)
 Reviewed appeal with patient.  Explained difference between CAD and CHF and the specific indications allowed.  Patient voiced understanding.

## 2023-12-23 ENCOUNTER — Other Ambulatory Visit: Payer: Self-pay | Admitting: Cardiovascular Disease

## 2024-01-03 ENCOUNTER — Ambulatory Visit (INDEPENDENT_AMBULATORY_CARE_PROVIDER_SITE_OTHER): Payer: Medicaid Other

## 2024-01-03 DIAGNOSIS — I428 Other cardiomyopathies: Secondary | ICD-10-CM | POA: Diagnosis not present

## 2024-01-04 LAB — CUP PACEART REMOTE DEVICE CHECK
Battery Remaining Longevity: 66 mo
Battery Remaining Percentage: 74 %
Brady Statistic RA Percent Paced: 35 %
Brady Statistic RV Percent Paced: 0 %
Date Time Interrogation Session: 20250722092900
HighPow Impedance: 78 Ohm
Implantable Lead Connection Status: 753985
Implantable Lead Connection Status: 753985
Implantable Lead Implant Date: 20161109
Implantable Lead Implant Date: 20161109
Implantable Lead Location: 753859
Implantable Lead Location: 753860
Implantable Lead Model: 293
Implantable Lead Model: 7741
Implantable Lead Serial Number: 385866
Implantable Lead Serial Number: 710559
Implantable Pulse Generator Implant Date: 20161109
Lead Channel Impedance Value: 458 Ohm
Lead Channel Impedance Value: 767 Ohm
Lead Channel Pacing Threshold Amplitude: 1.3 V
Lead Channel Pacing Threshold Amplitude: 1.4 V
Lead Channel Pacing Threshold Pulse Width: 0.6 ms
Lead Channel Pacing Threshold Pulse Width: 1 ms
Lead Channel Setting Pacing Amplitude: 2.5 V
Lead Channel Setting Pacing Amplitude: 2.8 V
Lead Channel Setting Pacing Pulse Width: 1 ms
Lead Channel Setting Sensing Sensitivity: 0.6 mV
Pulse Gen Serial Number: 512157
Zone Setting Status: 755011

## 2024-01-12 NOTE — Telephone Encounter (Signed)
 Alexander Higgins returned call, see telephone note 10/25/23.

## 2024-01-16 ENCOUNTER — Ambulatory Visit: Payer: Self-pay | Admitting: Cardiovascular Disease

## 2024-03-14 ENCOUNTER — Other Ambulatory Visit: Payer: Self-pay

## 2024-03-15 MED ORDER — CARVEDILOL 12.5 MG PO TABS: 12.5000 mg | ORAL_TABLET | Freq: Two times a day (BID) | ORAL | 1 refills | Status: AC

## 2024-03-16 NOTE — Progress Notes (Signed)
 Remote ICD Transmission

## 2024-04-03 ENCOUNTER — Ambulatory Visit: Payer: Medicaid Other

## 2024-04-03 DIAGNOSIS — I428 Other cardiomyopathies: Secondary | ICD-10-CM | POA: Diagnosis not present

## 2024-04-05 LAB — CUP PACEART REMOTE DEVICE CHECK
Battery Remaining Longevity: 66 mo
Battery Remaining Percentage: 72 %
Brady Statistic RA Percent Paced: 35 %
Brady Statistic RV Percent Paced: 0 %
Date Time Interrogation Session: 20251021011400
HighPow Impedance: 77 Ohm
Implantable Lead Connection Status: 753985
Implantable Lead Connection Status: 753985
Implantable Lead Implant Date: 20161109
Implantable Lead Implant Date: 20161109
Implantable Lead Location: 753859
Implantable Lead Location: 753860
Implantable Lead Model: 293
Implantable Lead Model: 7741
Implantable Lead Serial Number: 385866
Implantable Lead Serial Number: 710559
Implantable Pulse Generator Implant Date: 20161109
Lead Channel Impedance Value: 486 Ohm
Lead Channel Impedance Value: 731 Ohm
Lead Channel Pacing Threshold Amplitude: 1.3 V
Lead Channel Pacing Threshold Amplitude: 1.4 V
Lead Channel Pacing Threshold Pulse Width: 0.6 ms
Lead Channel Pacing Threshold Pulse Width: 1 ms
Lead Channel Setting Pacing Amplitude: 2.5 V
Lead Channel Setting Pacing Amplitude: 2.8 V
Lead Channel Setting Pacing Pulse Width: 1 ms
Lead Channel Setting Sensing Sensitivity: 0.6 mV
Pulse Gen Serial Number: 512157
Zone Setting Status: 755011

## 2024-04-06 NOTE — Progress Notes (Signed)
 Remote ICD Transmission

## 2024-04-10 ENCOUNTER — Ambulatory Visit: Payer: Self-pay | Admitting: Cardiovascular Disease

## 2024-06-09 ENCOUNTER — Other Ambulatory Visit: Payer: Self-pay | Admitting: Cardiovascular Disease

## 2024-06-09 DIAGNOSIS — I480 Paroxysmal atrial fibrillation: Secondary | ICD-10-CM

## 2024-06-11 NOTE — Telephone Encounter (Signed)
 Prescription refill request for Eliquis  received. Indication: A-Fib Last office visit: 09/01/23 Scr: 0.70 05/04/23 [facility] Age:78 Weight: 139.7 KG Pt has past Parameters. Overdue labs

## 2024-06-12 ENCOUNTER — Other Ambulatory Visit: Payer: Self-pay | Admitting: Cardiovascular Disease

## 2024-06-12 DIAGNOSIS — I480 Paroxysmal atrial fibrillation: Secondary | ICD-10-CM

## 2024-06-12 NOTE — Telephone Encounter (Signed)
 Eliquis  5mg  refill request received. Patient is 78 years old, weight-139.7kg, Crea-1.2 on 05/21/23 via [facility] from [facility], Diagnosis-Afib, and last seen by Dr. Francyne on 09/01/23. Dose is appropriate based on dosing criteria.   Pt needs updated labs.  06/13/24-was preparing to call the patient and noted patient went to Dr. Baldwin with [facility] on 06/13/24 and labs were drawn and Crea-1.40; will send in refill to requested pharmacy.

## 2024-06-13 MED ORDER — APIXABAN 5 MG PO TABS: 5.0000 mg | ORAL_TABLET | Freq: Two times a day (BID) | ORAL | 1 refills | Status: AC

## 2024-07-03 ENCOUNTER — Ambulatory Visit: Payer: Medicare (Managed Care)

## 2024-07-03 DIAGNOSIS — I428 Other cardiomyopathies: Secondary | ICD-10-CM | POA: Diagnosis not present

## 2024-07-04 LAB — CUP PACEART REMOTE DEVICE CHECK
Battery Remaining Longevity: 54 mo
Battery Remaining Percentage: 64 %
Brady Statistic RA Percent Paced: 36 %
Brady Statistic RV Percent Paced: 0 %
Date Time Interrogation Session: 20260120022000
HighPow Impedance: 82 Ohm
Implantable Lead Connection Status: 753985
Implantable Lead Connection Status: 753985
Implantable Lead Implant Date: 20161109
Implantable Lead Implant Date: 20161109
Implantable Lead Location: 753859
Implantable Lead Location: 753860
Implantable Lead Model: 293
Implantable Lead Model: 7741
Implantable Lead Serial Number: 385866
Implantable Lead Serial Number: 710559
Implantable Pulse Generator Implant Date: 20161109
Lead Channel Impedance Value: 534 Ohm
Lead Channel Impedance Value: 772 Ohm
Lead Channel Pacing Threshold Amplitude: 1.3 V
Lead Channel Pacing Threshold Amplitude: 1.4 V
Lead Channel Pacing Threshold Pulse Width: 0.6 ms
Lead Channel Pacing Threshold Pulse Width: 1 ms
Lead Channel Setting Pacing Amplitude: 2.5 V
Lead Channel Setting Pacing Amplitude: 2.8 V
Lead Channel Setting Pacing Pulse Width: 1 ms
Lead Channel Setting Sensing Sensitivity: 0.6 mV
Pulse Gen Serial Number: 512157
Zone Setting Status: 755011

## 2024-07-06 NOTE — Progress Notes (Signed)
 Remote ICD Transmission

## 2024-07-08 ENCOUNTER — Ambulatory Visit: Payer: Self-pay | Admitting: Cardiovascular Disease

## 2024-10-02 ENCOUNTER — Ambulatory Visit

## 2025-01-01 ENCOUNTER — Ambulatory Visit

## 2025-04-02 ENCOUNTER — Ambulatory Visit

## 2025-07-02 ENCOUNTER — Ambulatory Visit

## 2025-10-01 ENCOUNTER — Ambulatory Visit
